# Patient Record
Sex: Female | Born: 1955 | Race: White | Hispanic: No | Marital: Married | Smoking: Never smoker
Health system: Southern US, Community
[De-identification: ages and names within clinical notes are randomized; demographics above are authoritative.]

## PROBLEM LIST (undated history)

## (undated) DIAGNOSIS — C4491 Basal cell carcinoma of skin, unspecified: Secondary | ICD-10-CM

## (undated) DIAGNOSIS — I10 Essential (primary) hypertension: Secondary | ICD-10-CM

## (undated) HISTORY — DX: Essential (primary) hypertension: I10

## (undated) HISTORY — PX: MYOMECTOMY: SHX85

## (undated) HISTORY — DX: Basal cell carcinoma of skin, unspecified: C44.91

---

## 1998-07-13 ENCOUNTER — Other Ambulatory Visit: Admission: RE | Admit: 1998-07-13 | Discharge: 1998-07-13 | Payer: Self-pay | Admitting: *Deleted

## 1999-10-04 ENCOUNTER — Other Ambulatory Visit: Admission: RE | Admit: 1999-10-04 | Discharge: 1999-10-04 | Payer: Self-pay | Admitting: Obstetrics and Gynecology

## 1999-11-30 ENCOUNTER — Encounter (INDEPENDENT_AMBULATORY_CARE_PROVIDER_SITE_OTHER): Payer: Self-pay

## 1999-11-30 ENCOUNTER — Other Ambulatory Visit: Admission: RE | Admit: 1999-11-30 | Discharge: 1999-11-30 | Payer: Self-pay | Admitting: Obstetrics and Gynecology

## 2000-12-12 ENCOUNTER — Other Ambulatory Visit: Admission: RE | Admit: 2000-12-12 | Discharge: 2000-12-12 | Payer: Self-pay | Admitting: Obstetrics and Gynecology

## 2001-03-19 ENCOUNTER — Ambulatory Visit (HOSPITAL_COMMUNITY): Admission: RE | Admit: 2001-03-19 | Discharge: 2001-03-19 | Payer: Self-pay | Admitting: Obstetrics and Gynecology

## 2001-03-19 ENCOUNTER — Encounter (INDEPENDENT_AMBULATORY_CARE_PROVIDER_SITE_OTHER): Payer: Self-pay

## 2001-12-31 ENCOUNTER — Other Ambulatory Visit: Admission: RE | Admit: 2001-12-31 | Discharge: 2001-12-31 | Payer: Self-pay | Admitting: Obstetrics and Gynecology

## 2003-01-18 ENCOUNTER — Other Ambulatory Visit: Admission: RE | Admit: 2003-01-18 | Discharge: 2003-01-18 | Payer: Self-pay | Admitting: Obstetrics and Gynecology

## 2004-04-05 ENCOUNTER — Other Ambulatory Visit: Admission: RE | Admit: 2004-04-05 | Discharge: 2004-04-05 | Payer: Self-pay | Admitting: Obstetrics and Gynecology

## 2004-10-17 ENCOUNTER — Ambulatory Visit: Payer: Self-pay | Admitting: Family Medicine

## 2005-03-14 ENCOUNTER — Ambulatory Visit: Payer: Self-pay | Admitting: Family Medicine

## 2005-03-21 ENCOUNTER — Ambulatory Visit: Payer: Self-pay | Admitting: Gastroenterology

## 2005-03-28 ENCOUNTER — Ambulatory Visit: Payer: Self-pay | Admitting: Family Medicine

## 2005-04-26 ENCOUNTER — Other Ambulatory Visit: Admission: RE | Admit: 2005-04-26 | Discharge: 2005-04-26 | Payer: Self-pay | Admitting: Obstetrics and Gynecology

## 2005-06-14 ENCOUNTER — Ambulatory Visit: Payer: Self-pay | Admitting: Internal Medicine

## 2005-06-14 ENCOUNTER — Ambulatory Visit: Payer: Self-pay | Admitting: Family Medicine

## 2006-05-01 ENCOUNTER — Other Ambulatory Visit: Admission: RE | Admit: 2006-05-01 | Discharge: 2006-05-01 | Payer: Self-pay | Admitting: Obstetrics and Gynecology

## 2006-09-18 ENCOUNTER — Ambulatory Visit: Payer: Self-pay | Admitting: Family Medicine

## 2006-09-26 ENCOUNTER — Ambulatory Visit: Payer: Self-pay | Admitting: Family Medicine

## 2006-09-26 LAB — CONVERTED CEMR LAB
ALT: 14 units/L (ref 0–40)
AST: 20 units/L (ref 0–37)
Albumin: 4.4 g/dL (ref 3.5–5.2)
Alkaline Phosphatase: 40 units/L (ref 39–117)
BUN: 18 mg/dL (ref 6–23)
Basophils Relative: 0.5 % (ref 0.0–1.0)
CO2: 23 meq/L (ref 19–32)
Calcium: 9.7 mg/dL (ref 8.4–10.5)
Cholesterol: 174 mg/dL (ref 0–200)
GFR calc non Af Amer: 80 mL/min
HCT: 39.6 % (ref 36.0–46.0)
HDL: 75 mg/dL (ref >39.0)
LDL Cholesterol: 91 mg/dL (ref 0–99)
MCHC: 33.9 g/dL (ref 30.0–36.0)
Monocytes Absolute: 0.4 10*3/uL (ref 0.2–0.7)
Neutrophils Relative %: 53 % (ref 43.0–77.0)
Platelets: 234 10*3/uL (ref 150–400)
Triglyceride fasting, serum: 42 mg/dL (ref 0–149)
VLDL: 8 mg/dL (ref 0–40)

## 2006-10-10 ENCOUNTER — Ambulatory Visit: Payer: Self-pay | Admitting: Family Medicine

## 2006-10-31 ENCOUNTER — Ambulatory Visit: Payer: Self-pay | Admitting: Family Medicine

## 2006-11-20 ENCOUNTER — Ambulatory Visit: Payer: Self-pay | Admitting: Family Medicine

## 2006-11-20 LAB — CONVERTED CEMR LAB
CO2: 28 meq/L (ref 19–32)
Calcium: 9.4 mg/dL (ref 8.4–10.5)
Chloride: 102 meq/L (ref 96–112)
GFR calc non Af Amer: 94 mL/min
Glucose, Bld: 110 mg/dL — ABNORMAL HIGH (ref 70–99)
Potassium: 4.1 meq/L (ref 3.5–5.1)

## 2006-12-01 IMAGING — CT CT PARANASAL SINUSES LIMITED
1 of 2 series · 16 of 22 positions shown, 20 images · non-contrast
Comparison: none

CLINICAL DATA: 49-year-old female with chronic sinusitis and post-nasal drainage with sinus pain and pressure.  
LIMITED CT OF PARANASAL SINUSES:
TECHNIQUE: Limited coronal CT images were obtained through the paranasal sinuses without intravenous contrast.

[Series 4: ltd sinus 3.0 h30s · axial · 0.29mm/px · z∈[-92,-11]mm · 16 of 21 slices shown, 20 images]
[im 2/21  brain]
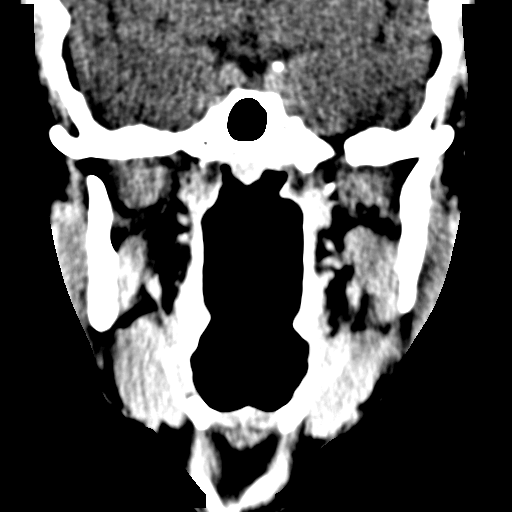
[im 2/21  bone]
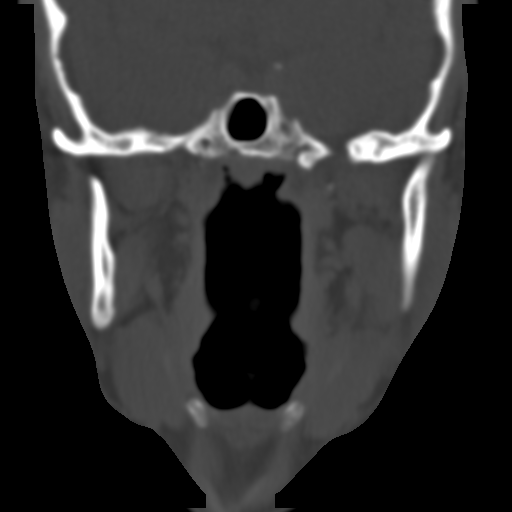
[im 3/21  bone]
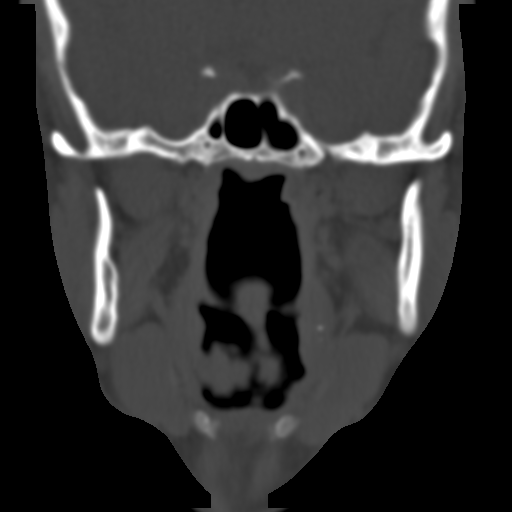
[im 4/21  bone]
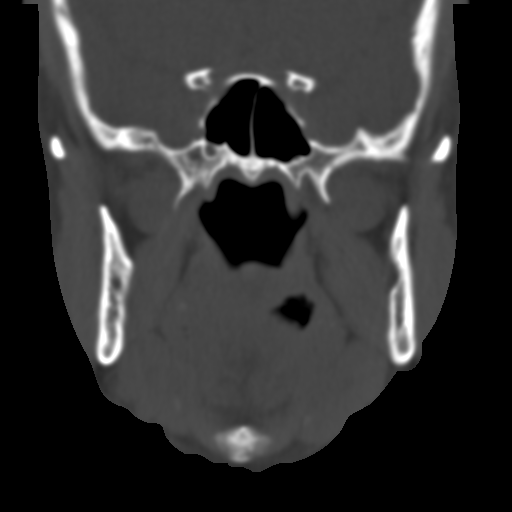
[im 6/21  bone]
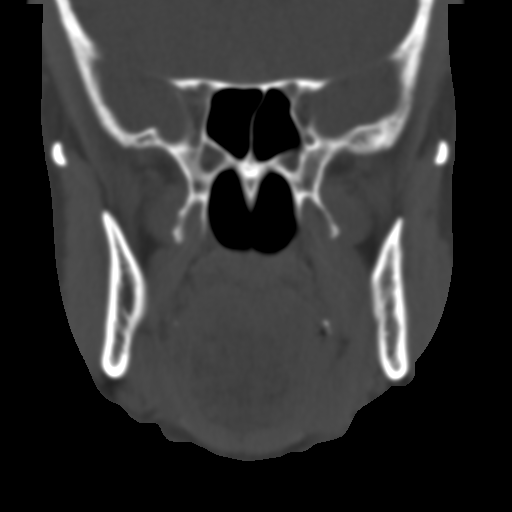
[im 7/21  brain]
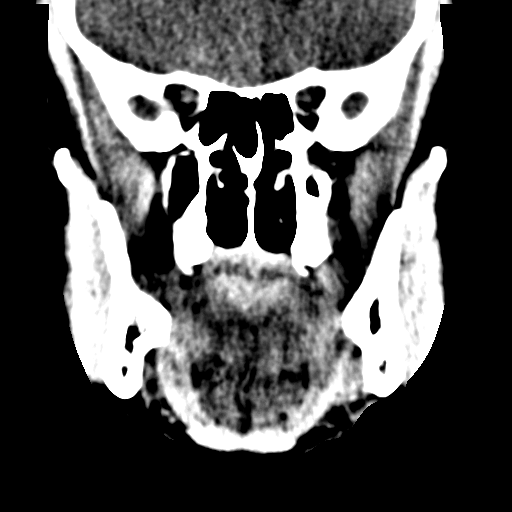
[im 7/21  bone]
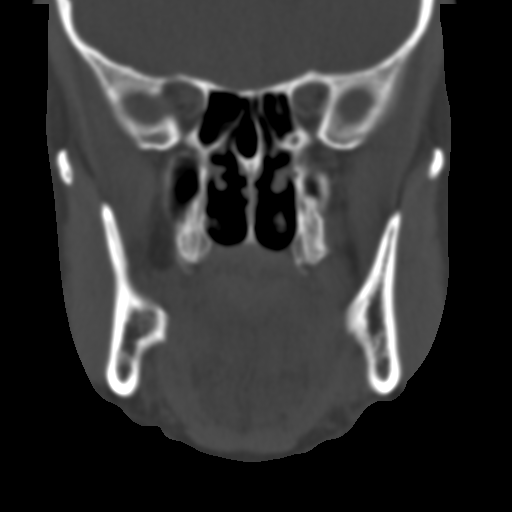
[im 8/21  bone]
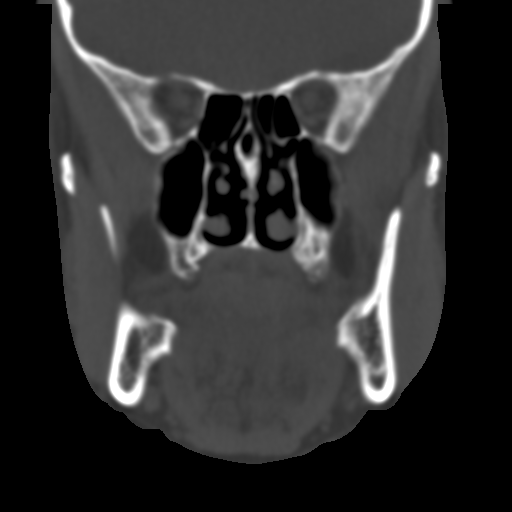
[im 9/21  bone]
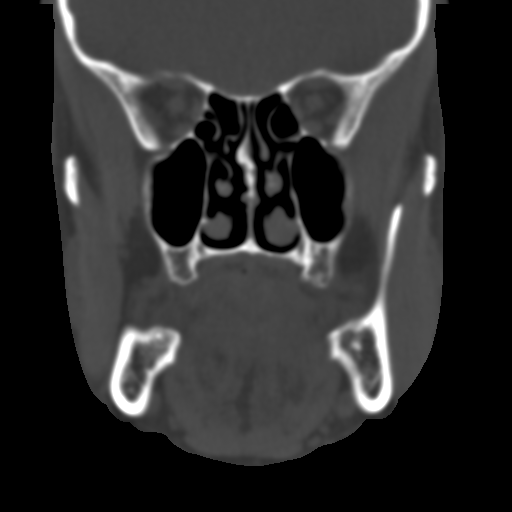
[im 10/21  bone]
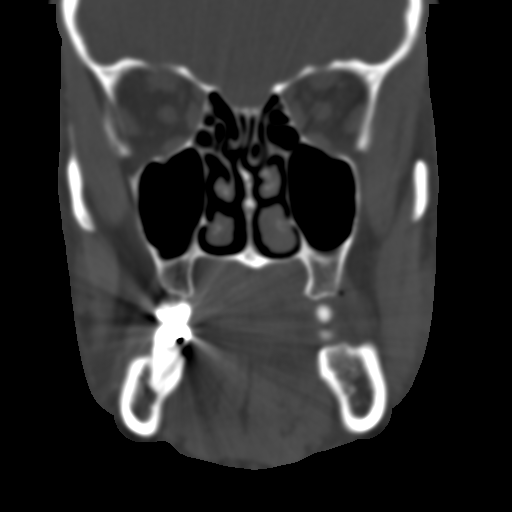
[im 12/21  brain]
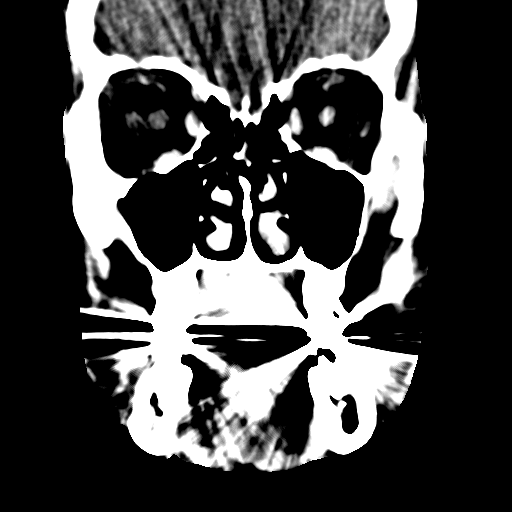
[im 12/21  bone]
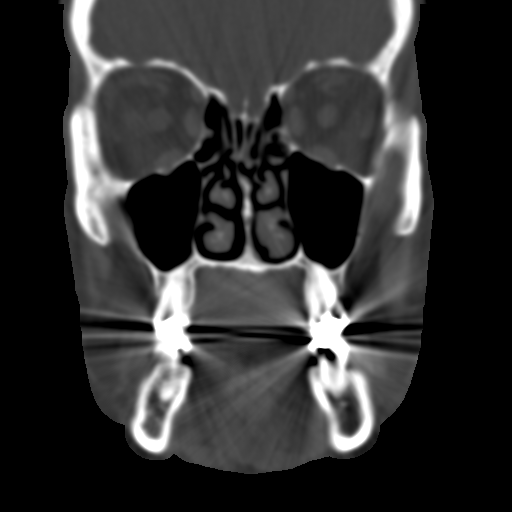
[im 13/21  bone]
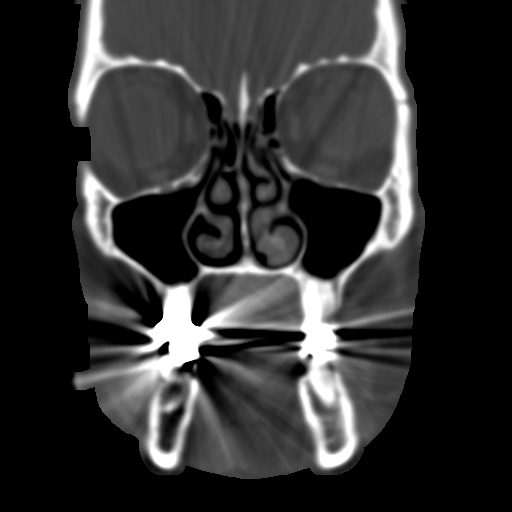
[im 14/21  bone]
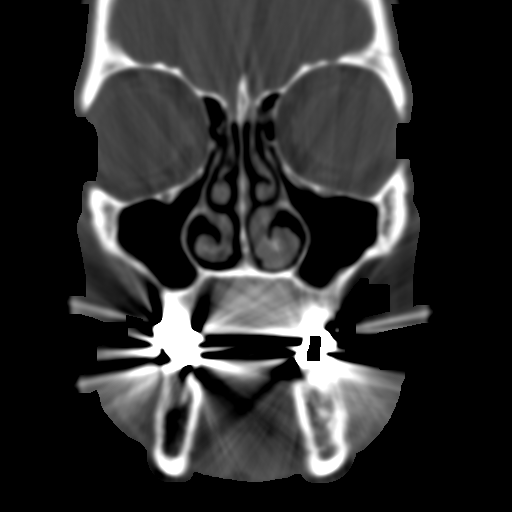
[im 15/21  bone]
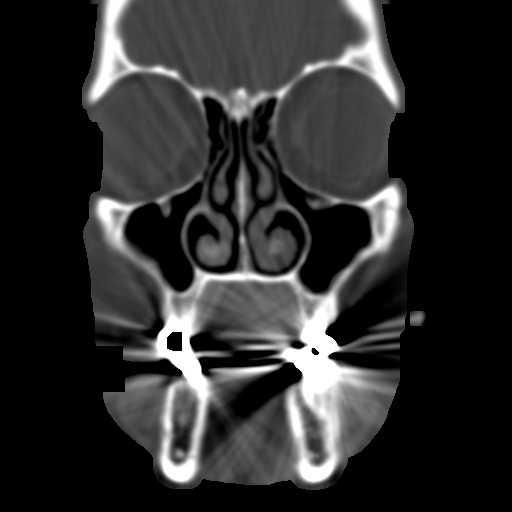
[im 16/21  brain]
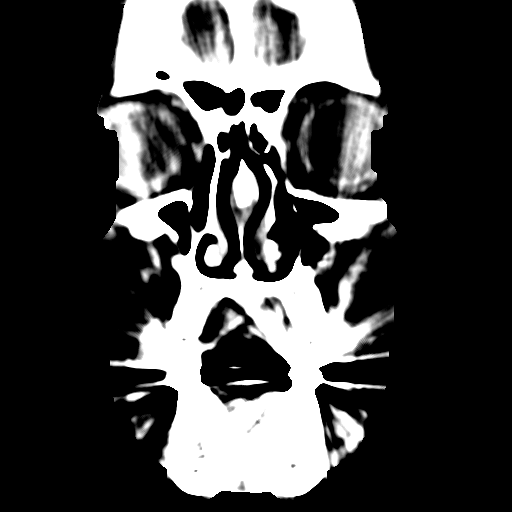
[im 16/21  bone]
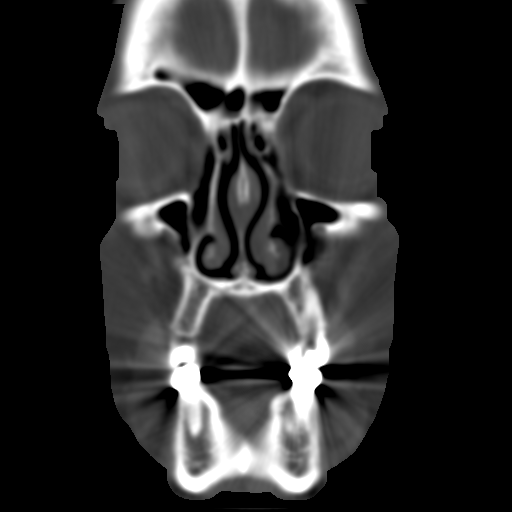
[im 18/21  bone]
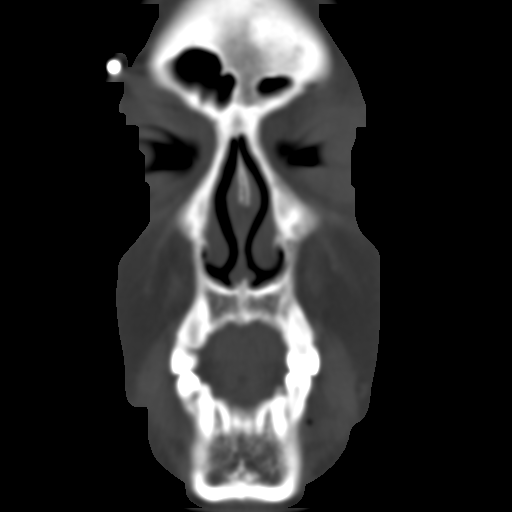
[im 19/21  bone]
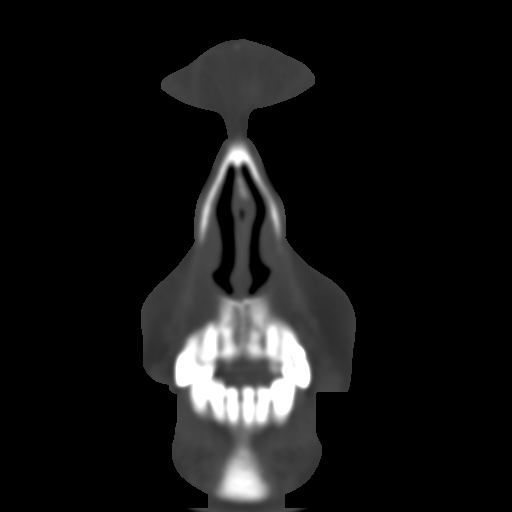
[im 20/21  bone]
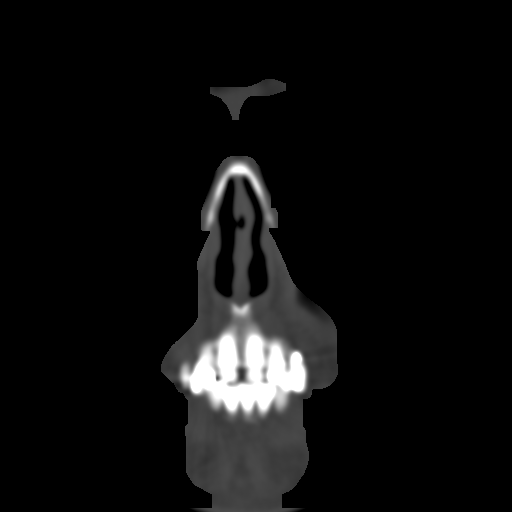

[16 of 22 positions shown; findings below may reference images not displayed]

FINDINGS: The sphenoid, ethmoid, maxillary, and frontal sinuses are clear.  No acute sinus air fluid level.  No significant hyperostosis or mucosal thickening to suggest chronic sinus disease.  The ostiomeatal complex anatomy is patent and symmetric.  Uncinate processes are normal.  No bony erosion.  Nasal bones are intact.  Septum is midline.
IMPRESSION: 1.  No significant acute or chronic sinus disease. 
2.  Patent symmetric ostiomeatal complex anatomy.

## 2007-01-20 ENCOUNTER — Ambulatory Visit: Payer: Self-pay | Admitting: Family Medicine

## 2007-02-07 ENCOUNTER — Ambulatory Visit: Payer: Self-pay | Admitting: Family Medicine

## 2007-02-11 ENCOUNTER — Encounter: Payer: Self-pay | Admitting: Family Medicine

## 2007-03-14 DIAGNOSIS — J019 Acute sinusitis, unspecified: Secondary | ICD-10-CM | POA: Insufficient documentation

## 2007-03-14 DIAGNOSIS — C4490 Unspecified malignant neoplasm of skin, unspecified: Secondary | ICD-10-CM | POA: Insufficient documentation

## 2007-03-18 ENCOUNTER — Encounter: Payer: Self-pay | Admitting: Family Medicine

## 2007-03-18 ENCOUNTER — Ambulatory Visit: Payer: Self-pay | Admitting: Family Medicine

## 2007-03-18 DIAGNOSIS — M25519 Pain in unspecified shoulder: Secondary | ICD-10-CM | POA: Insufficient documentation

## 2007-03-18 DIAGNOSIS — I10 Essential (primary) hypertension: Secondary | ICD-10-CM | POA: Insufficient documentation

## 2007-03-18 DIAGNOSIS — Z85828 Personal history of other malignant neoplasm of skin: Secondary | ICD-10-CM | POA: Insufficient documentation

## 2007-03-18 LAB — CONVERTED CEMR LAB
BUN: 18 mg/dL (ref 6–23)
CO2: 31 meq/L (ref 19–32)
Creatinine, Ser: 0.7 mg/dL (ref 0.4–1.2)

## 2007-04-08 ENCOUNTER — Encounter: Payer: Self-pay | Admitting: Family Medicine

## 2007-04-08 ENCOUNTER — Ambulatory Visit: Payer: Self-pay | Admitting: Family Medicine

## 2007-04-08 DIAGNOSIS — I714 Abdominal aortic aneurysm, without rupture, unspecified: Secondary | ICD-10-CM | POA: Insufficient documentation

## 2007-07-03 ENCOUNTER — Ambulatory Visit: Payer: Self-pay | Admitting: Family Medicine

## 2007-07-07 ENCOUNTER — Encounter: Payer: Self-pay | Admitting: Family Medicine

## 2007-07-11 ENCOUNTER — Encounter (INDEPENDENT_AMBULATORY_CARE_PROVIDER_SITE_OTHER): Payer: Self-pay | Admitting: *Deleted

## 2007-09-29 ENCOUNTER — Ambulatory Visit: Payer: Self-pay | Admitting: Family Medicine

## 2007-09-30 ENCOUNTER — Encounter: Payer: Self-pay | Admitting: Family Medicine

## 2008-01-21 ENCOUNTER — Encounter: Payer: Self-pay | Admitting: Family Medicine

## 2008-01-21 LAB — HM COLONOSCOPY

## 2008-01-23 ENCOUNTER — Encounter: Payer: Self-pay | Admitting: Family Medicine

## 2008-03-10 ENCOUNTER — Telehealth (INDEPENDENT_AMBULATORY_CARE_PROVIDER_SITE_OTHER): Payer: Self-pay | Admitting: *Deleted

## 2008-06-18 ENCOUNTER — Ambulatory Visit: Payer: Self-pay | Admitting: Family Medicine

## 2008-06-18 DIAGNOSIS — R059 Cough, unspecified: Secondary | ICD-10-CM | POA: Insufficient documentation

## 2008-06-18 DIAGNOSIS — R05 Cough: Secondary | ICD-10-CM

## 2008-06-18 DIAGNOSIS — R053 Chronic cough: Secondary | ICD-10-CM | POA: Insufficient documentation

## 2008-07-05 ENCOUNTER — Ambulatory Visit: Payer: Self-pay | Admitting: Family Medicine

## 2008-07-06 ENCOUNTER — Encounter (INDEPENDENT_AMBULATORY_CARE_PROVIDER_SITE_OTHER): Payer: Self-pay | Admitting: *Deleted

## 2008-07-06 LAB — CONVERTED CEMR LAB
BUN: 14 mg/dL (ref 6–23)
Calcium: 9.4 mg/dL (ref 8.4–10.5)
Chloride: 106 meq/L (ref 96–112)
GFR calc Af Amer: 166 mL/min
Potassium: 4.4 meq/L (ref 3.5–5.1)
Sodium: 141 meq/L (ref 135–145)

## 2008-07-07 ENCOUNTER — Encounter: Payer: Self-pay | Admitting: Family Medicine

## 2008-07-22 ENCOUNTER — Telehealth (INDEPENDENT_AMBULATORY_CARE_PROVIDER_SITE_OTHER): Payer: Self-pay | Admitting: *Deleted

## 2008-07-22 ENCOUNTER — Encounter (INDEPENDENT_AMBULATORY_CARE_PROVIDER_SITE_OTHER): Payer: Self-pay | Admitting: *Deleted

## 2008-08-09 ENCOUNTER — Ambulatory Visit: Payer: Self-pay | Admitting: Family Medicine

## 2008-08-09 DIAGNOSIS — J0110 Acute frontal sinusitis, unspecified: Secondary | ICD-10-CM | POA: Insufficient documentation

## 2008-10-04 ENCOUNTER — Encounter: Payer: Self-pay | Admitting: Family Medicine

## 2008-11-09 ENCOUNTER — Ambulatory Visit: Payer: Self-pay | Admitting: Family Medicine

## 2008-11-10 ENCOUNTER — Telehealth: Payer: Self-pay | Admitting: Family Medicine

## 2009-03-07 ENCOUNTER — Ambulatory Visit: Payer: Self-pay | Admitting: Family Medicine

## 2009-03-15 ENCOUNTER — Telehealth: Payer: Self-pay | Admitting: Family Medicine

## 2009-07-01 ENCOUNTER — Ambulatory Visit: Payer: Self-pay | Admitting: Family Medicine

## 2009-07-01 DIAGNOSIS — D239 Other benign neoplasm of skin, unspecified: Secondary | ICD-10-CM | POA: Insufficient documentation

## 2009-07-02 ENCOUNTER — Encounter: Payer: Self-pay | Admitting: Family Medicine

## 2009-07-06 ENCOUNTER — Encounter (INDEPENDENT_AMBULATORY_CARE_PROVIDER_SITE_OTHER): Payer: Self-pay | Admitting: *Deleted

## 2009-07-12 ENCOUNTER — Encounter (INDEPENDENT_AMBULATORY_CARE_PROVIDER_SITE_OTHER): Payer: Self-pay | Admitting: *Deleted

## 2009-07-12 ENCOUNTER — Telehealth (INDEPENDENT_AMBULATORY_CARE_PROVIDER_SITE_OTHER): Payer: Self-pay | Admitting: *Deleted

## 2009-07-12 ENCOUNTER — Encounter: Payer: Self-pay | Admitting: Family Medicine

## 2009-08-17 ENCOUNTER — Encounter: Payer: Self-pay | Admitting: Family Medicine

## 2009-08-23 ENCOUNTER — Ambulatory Visit: Payer: Self-pay | Admitting: Family Medicine

## 2009-08-24 ENCOUNTER — Encounter: Payer: Self-pay | Admitting: Family Medicine

## 2010-01-06 ENCOUNTER — Ambulatory Visit: Payer: Self-pay | Admitting: Family Medicine

## 2010-05-02 ENCOUNTER — Ambulatory Visit: Payer: Self-pay | Admitting: Family Medicine

## 2010-07-25 ENCOUNTER — Encounter: Payer: Self-pay | Admitting: Family Medicine

## 2010-09-13 ENCOUNTER — Ambulatory Visit: Payer: Self-pay | Admitting: Family Medicine

## 2010-09-13 ENCOUNTER — Encounter: Payer: Self-pay | Admitting: Family Medicine

## 2010-09-13 DIAGNOSIS — J309 Allergic rhinitis, unspecified: Secondary | ICD-10-CM | POA: Insufficient documentation

## 2010-09-15 LAB — CONVERTED CEMR LAB
Basophils Relative: 0.7 % (ref 0.0–3.0)
Eosinophils Absolute: 0.2 10*3/uL (ref 0.0–0.7)
Hemoglobin: 13.3 g/dL (ref 12.0–15.0)
MCHC: 34.2 g/dL (ref 30.0–36.0)
MCV: 93.5 fL (ref 78.0–100.0)
Monocytes Absolute: 0.4 10*3/uL (ref 0.1–1.0)
Neutro Abs: 1.6 10*3/uL (ref 1.4–7.7)
RBC: 4.16 M/uL (ref 3.87–5.11)

## 2010-09-19 ENCOUNTER — Ambulatory Visit: Payer: Self-pay | Admitting: Family Medicine

## 2010-10-18 ENCOUNTER — Encounter: Payer: Self-pay | Admitting: Family Medicine

## 2010-10-23 ENCOUNTER — Telehealth (INDEPENDENT_AMBULATORY_CARE_PROVIDER_SITE_OTHER): Payer: Self-pay | Admitting: *Deleted

## 2010-10-27 ENCOUNTER — Ambulatory Visit: Payer: Self-pay | Admitting: Family Medicine

## 2010-11-27 ENCOUNTER — Ambulatory Visit
Admission: RE | Admit: 2010-11-27 | Discharge: 2010-11-27 | Payer: Self-pay | Source: Home / Self Care | Attending: Family Medicine | Admitting: Family Medicine

## 2010-12-10 LAB — CONVERTED CEMR LAB
ALT: 15 units/L (ref 0–35)
AST: 23 units/L (ref 0–37)
Albumin: 4.4 g/dL (ref 3.5–5.2)
Albumin: 4.6 g/dL (ref 3.5–5.2)
Alkaline Phosphatase: 34 units/L — ABNORMAL LOW (ref 39–117)
BUN: 17 mg/dL (ref 6–23)
BUN: 20 mg/dL (ref 6–23)
Basophils Absolute: 0 10*3/uL (ref 0.0–0.1)
Basophils Relative: 0.3 % (ref 0.0–3.0)
Bilirubin, Direct: 0.1 mg/dL (ref 0.0–0.3)
CO2: 30 meq/L (ref 19–32)
Calcium: 9.6 mg/dL (ref 8.4–10.5)
Chloride: 103 meq/L (ref 96–112)
Chloride: 107 meq/L (ref 96–112)
Cholesterol: 185 mg/dL (ref 0–200)
Cholesterol: 188 mg/dL (ref 0–200)
Creatinine, Ser: 0.6 mg/dL (ref 0.4–1.2)
Eosinophils Absolute: 0.1 10*3/uL (ref 0.0–0.7)
Eosinophils Absolute: 0.2 10*3/uL (ref 0.0–0.6)
GFR calc non Af Amer: 110.54 mL/min (ref >60)
GFR calc non Af Amer: 112 mL/min
Glucose, Bld: 97 mg/dL (ref 70–99)
HCT: 41 % (ref 36.0–46.0)
Hemoglobin: 13.5 g/dL (ref 12.0–15.0)
Hemoglobin: 13.9 g/dL (ref 12.0–15.0)
LDL Cholesterol: 88 mg/dL (ref 0–99)
LDL Cholesterol: 95 mg/dL (ref 0–99)
Lymphocytes Relative: 31.8 % (ref 12.0–46.0)
Lymphocytes Relative: 34.8 % (ref 12.0–46.0)
MCV: 94.4 fL (ref 78.0–100.0)
Monocytes Absolute: 0.4 10*3/uL (ref 0.1–1.0)
Monocytes Absolute: 0.5 10*3/uL (ref 0.2–0.7)
Monocytes Relative: 8.3 % (ref 3.0–12.0)
Neutro Abs: 2.4 10*3/uL (ref 1.4–7.7)
Platelets: 231 10*3/uL (ref 150.0–400.0)
Platelets: 242 10*3/uL (ref 150–400)
Potassium: 3.4 meq/L — ABNORMAL LOW (ref 3.5–5.1)
RBC: 4.34 M/uL (ref 3.87–5.11)
RDW: 12.1 % (ref 11.5–14.6)
RDW: 12.7 % (ref 11.5–14.6)
Sodium: 139 meq/L (ref 135–145)
Sodium: 142 meq/L (ref 135–145)
TSH: 1.85 microintl units/mL (ref 0.35–5.50)
Total Bilirubin: 1 mg/dL (ref 0.3–1.2)
Total CHOL/HDL Ratio: 2.5
Total Protein: 8.2 g/dL (ref 6.0–8.3)
Triglycerides: 105 mg/dL (ref 0.0–149.0)

## 2010-12-12 NOTE — Assessment & Plan Note (Signed)
Summary: FORM SINUS//PH   Vital Signs:  Patient profile:   55 year old female Weight:      121.8 pounds O2 Sat:      100 % on Room air Temp:     98.3 degrees F oral Pulse rate:   72 / minute Pulse rhythm:   regular BP sitting:   116 / 80  (right arm) Cuff size:   regular  Vitals Entered By: Almeta Monas CMA Duncan Dull) (September 19, 2010 10:52 AM)  O2 Flow:  Room air CC: c/o sinus pressure, facial swelling, cough, congestion and post nasal drip, URI symptoms   History of Present Illness:       This is a 55 year old woman who presents with URI symptoms.  The symptoms began 5 days ago.  Pt c/o b/l sinus pressure and teeth pain since friday.  The patient complains of nasal congestion, purulent nasal discharge, productive cough, earache, and sick contacts, but denies clear nasal discharge, sore throat, and dry cough.  The patient denies fever, low-grade fever (<100.5 degrees), fever of 100.5-103 degrees, fever of 103.1-104 degrees, fever to >104 degrees, stiff neck, dyspnea, wheezing, rash, vomiting, diarrhea, use of an antipyretic, and response to antipyretic.  The patient also reports headache.  The patient denies itchy watery eyes, itchy throat, sneezing, seasonal symptoms, response to antihistamine, muscle aches, and severe fatigue.  The patient denies the following risk factors for Strep sinusitis: unilateral facial pain, unilateral nasal discharge, poor response to decongestant, double sickening, tooth pain, Strep exposure, tender adenopathy, and absence of cough.    Current Medications (verified): 1)  Diovan Hct 160-12.5 Mg Tabs (Valsartan-Hydrochlorothiazide) .... Take One Tablet Daily 2)  Astepro 137 Mcg/spray  Soln (Azelastine Hcl) .... 2 Sprays Each Nostril Two Times A Day 3)  Calcium 500/vitamin D 500-125 Mg-Unit Tabs (Calcium Carbonate-Vitamin D) .... Take As Directed 4)  Nasonex 50 Mcg/act Susp (Mometasone Furoate) .... 2 Sprays Each Nostril Once Daily 5)  Xyzal 5 Mg Tabs  (Levocetirizine Dihydrochloride) .Marland Kitchen.. 1 By Mouth Once Daily 6)  Klor-Con 10 10 Meq Cr-Tabs (Potassium Chloride) .... Take 1 By Mouth Once Daily**labs Due Now* 7)  Augmentin 875-125 Mg Tabs (Amoxicillin-Pot Clavulanate) .Marland Kitchen.. 1 By Mouth Two Times A Day 8)  Prednisone 10 Mg Tabs (Prednisone) .... 3 By Mouth Once Daily For 3 Days Then 2 By Mouth Once Daily For 3 Days Then 1 By Mouth Once Daily For 3 Days  Allergies (verified): 1)  ! Erythromycin 2)  ! 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 Family History Diabetes 1st degree relative Family History Hypertension Family History of Skin cancer, melanoma Family History of CAD Female 1st degree relative <50 copd  Social History: Reviewed history from 03/18/2007 and no changes required. 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   Mouth:  Oral mucosa and oropharynx without lesions or exudates.  Teeth in good repair. Neck:  No deformities, masses, or tenderness noted. Lungs:  Normal respiratory effort, chest expands symmetrically. Lungs are clear to auscultation, no crackles or wheezes. Heart:  normal rate and no murmur.     Impression & Recommendations:  Problem # 1:  SINUSITIS - ACUTE-NOS (ICD-461.9)  Her updated medication list for this problem includes:    Astepro 137 Mcg/spray Soln (Azelastine hcl) .Marland Kitchen... 2 sprays each nostril two times a day    Nasonex 50 Mcg/act Susp (Mometasone furoate) .Marland Kitchen... 2 sprays each nostril once daily    Augmentin 875-125 Mg Tabs (Amoxicillin-pot clavulanate) .Marland Kitchen... 1 by mouth two times a day  Instructed on treatment. Call if symptoms persist or worsen.   Orders: Depo- Medrol 80mg  (J1040) Admin of Therapeutic Inj  intramuscular or subcutaneous ([address])  Complete Medication List: 1)  Diovan Hct 160-12.5 Mg Tabs (Valsartan-hydrochlorothiazide) .... Take one tablet daily 2)  Astepro 137 Mcg/spray Soln (Azelastine hcl) .... 2 sprays each nostril two times a day 3)  Calcium 500/vitamin D 500-125 Mg-unit Tabs (Calcium carbonate-vitamin d) .... Take as directed 4)  Nasonex 50 Mcg/act Susp (Mometasone furoate) .... 2 sprays each nostril once daily 5)  Xyzal 5 Mg Tabs (Levocetirizine dihydrochloride) .Marland Kitchen.. 1 by mouth once daily 6)  Klor-con 10 10 Meq Cr-tabs (Potassium chloride) .... Take 1 by mouth once daily**labs due now* 7)  Augmentin 875-125 Mg Tabs (Amoxicillin-pot clavulanate) .Marland Kitchen.. 1 by mouth two times a day 8)  Prednisone 10 Mg Tabs (Prednisone) .... 3 by mouth once daily for 3 days then 2 by mouth once daily for 3 days then 1 by mouth once daily for 3 days  Patient Instructions: 1)  Acute sinusitis symptoms for less than 10 days are not helped by antibiotics. Use warm moist compresses, and over the counter decongestants( only as directed). Call if no  improvement in 5-7 days, sooner if increasing pain, fever, or new symptoms.  Prescriptions: PREDNISONE 10 MG TABS (PREDNISONE) 3 by mouth once daily for 3 days then 2 by mouth once daily for 3 days then 1 by mouth once daily for 3 days  #18 x 0   Entered and Authorized by:   Loreen Freud DO   Signed by:   Loreen Freud DO on 09/19/2010   Method used:   Electronically to        [facility] #[id number]* (retail)       [address]       [place], [place]  [address]       Ph: [phone]       Fax: [phone]   RxID:   [id number] AUGMENTIN 875-125 MG TABS (AMOXICILLIN-POT CLAVULANATE) 1 by mouth two times a day  #20 x 0   Entered and Authorized by:   Loreen Freud DO   Signed by:   Loreen Freud DO on 09/19/2010   Method used:   Electronically to        [facility] #[id number]* (retail)       [address]       [employer] [employer]  [place], [place]  [address]       Ph: [phone]       Fax: [phone]   RxID:   [id number]    Medication Administration  Injection # 1:    Medication: Depo- Medrol 80mg     Diagnosis: SINUSITIS - ACUTE-NOS (ICD-461.9)    Route: IM    Site: RUOQ gluteus    Exp Date: 12/13/2010    Lot #: [id number]    Mfr: [email or web address]    Patient tolerated injection without complications    Given by: Almeta Monas CMA Duncan Dull) (September 19, 2010 11:38 AM)  Orders Added: 1)  Depo- Medrol 80mg  [J1040] 2)  Admin of Therapeutic Inj  intramuscular or subcutaneous [96372] 3)  Est. Patient Level III [40102]

## 2010-12-12 NOTE — Letter (Signed)
Summary: External Correspondence//[facility]  External Correspondence//[facility]   Imported By: Freddy Jaksch 07/10/2007 12:12:20  _____________________________________________________________________  External Attachment:    Type:   Image     Comment:   External Document  Appended Document: External Correspondence//[facility]    Clinical Lists Changes  Observations: Added new observation of MAMMO DUE: 07/2008 (07/11/2007 10:26) Added new observation of MAMMOGRAM: normal bilateral (07/07/2007 10:27)        Preventive Care Screening  Mammogram:    Date:  07/07/2007    Next Due:  07/2008    Results:  normal bilateral  Appended Document: External Correspondence//[facility] LETTER MAILED

## 2010-12-12 NOTE — Assessment & Plan Note (Signed)
Summary: SINUS INFECTION/[facility]   Vital Signs:  Patient profile:   55 year old female Height:      66 inches Weight:      123 pounds BMI:     19.92 Temp:     98.2 degrees F oral Pulse rate:   76 / minute Pulse rhythm:   regular BP sitting:   124 / 82  (left arm) Cuff size:   regular  Vitals Entered By: Army Fossa CMA (January 06, 2010 9:57 AM) CC: Pt c/o sinus pressure around eyes, cough, drainage., URI symptoms   History of Present Illness:       This is a 55 year old woman who presents with URI symptoms.  The symptoms began 2 weeks ago.  The patient complains of nasal congestion, purulent nasal discharge, dry cough, and sick contacts.  The patient denies fever, low-grade fever (<100.5 degrees), fever of 100.5-103 degrees, fever of 103.1-104 degrees, fever to >104 degrees, stiff neck, dyspnea, wheezing, rash, vomiting, diarrhea, use of an antipyretic, and response to antipyretic.  The patient also reports itchy throat and headache.  The patient denies itchy watery eyes, sneezing, seasonal symptoms, response to antihistamine, muscle aches, and severe fatigue.  The patient denies the following risk factors for Strep sinusitis: unilateral facial pain, unilateral nasal discharge, poor response to decongestant, double sickening, tooth pain, Strep exposure, tender adenopathy, and absence of cough.  Pt taking sprays and zyrtec with no relief.     Current Medications (verified): 1)  Diovan Hct 160-12.5 Mg Tabs (Valsartan-Hydrochlorothiazide) .... Take One Tablet Daily 2)  Astepro 137 Mcg/spray  Soln (Azelastine Hcl) .... 2 Sprays Each Nostril Two Times A Day 3)  Calcium 500/vitamin D 500-125 Mg-Unit Tabs (Calcium Carbonate-Vitamin D) .... Take As Directed 4)  Nasonex 50 Mcg/act Susp (Mometasone Furoate) .... 2 Sprays Each Nostril Once Daily 5)  Zyrtec Allergy 10 Mg Caps (Cetirizine Hcl) .... Take 1 Tab Once Daily 6)  Klor-Con 10 10 Meq Cr-Tabs (Potassium Chloride) .... Take 1 By Mouth Once  Daily 7)  Ceftin 500 Mg Tabs (Cefuroxime Axetil) .Marland Kitchen.. 1 By Mouth Two Times A Day 8)  Prednisone 10 Mg Tabs (Prednisone) .... 3 By Mouth Once Daily For 3 Days Then 2 Poqd For 3 Days Then 1 By Mouth Once Daily For 3 Days  Allergies: 1)  ! Erythromycin 2)  ! Ace Inhibitors  Past History:  Past medical, surgical, family and social histories (including risk factors) reviewed for relevance to current acute and chronic problems.  Past Medical History: Reviewed history from 03/18/2007 and no changes required. Skin cancer, hx of, Basal cell Hypertension  Past Surgical History: Reviewed history from 09/29/2007 and no changes required. myomectomy 03/2001  Family History: Reviewed history from 09/29/2007 and no changes required. Family History Diabetes 1st degree relative Family History Hypertension Family History of Skin cancer, melanoma Family History of CAD Female 1st degree relative <50 copd  Social History: Reviewed history from 03/18/2007 and no changes required. 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  Otoscopic examination reveals clear canals, tympanic membranes are intact bilaterally without bulging, retraction, inflammation or discharge. Hearing is grossly normal bilaterally. Nose:  L frontal sinus tenderness, L maxillary sinus tenderness, R frontal sinus tenderness, and R maxillary  sinus tenderness.   Mouth:  Oral mucosa and oropharynx without lesions or exudates.  Teeth in good repair. Neck:  No deformities, masses, or tenderness noted. Lungs:  Normal respiratory effort, chest expands symmetrically. Lungs are clear to auscultation, no crackles or wheezes. Heart:  Normal  rate and regular rhythm. S1 and S2 normal without gallop, murmur, click, rub or other extra sounds. Msk:  No deformity or scoliosis noted of thoracic or lumbar spine.   Extremities:  No clubbing, cyanosis, edema, or deformity noted with normal full range of motion of all joints.   Neurologic:  No cranial nerve deficits noted. Station and gait are normal. Plantar reflexes are down-going bilaterally. DTRs are symmetrical throughout. Sensory, motor and coordinative functions appear intact. Skin:  Intact without suspicious lesions or rashes Cervical Nodes:  No lymphadenopathy noted Psych:  Oriented X3 and normally interactive.     Impression & Recommendations:  Problem # 1:  ACUTE FRONTAL SINUSITIS (ICD-461.1)  Her updated medication list for this problem includes:    Astepro 137 Mcg/spray Soln (Azelastine hcl) .Marland Kitchen... 2 sprays each nostril two times a day    Nasonex 50 Mcg/act Susp (Mometasone furoate) .Marland Kitchen... 2 sprays each nostril once daily    Ceftin 500 Mg Tabs (Cefuroxime axetil) .Marland Kitchen... 1 by mouth two times a day    prednisone taper   Instructed on treatment. Call if symptoms persist or worsen.   Complete Medication List: 1)  Diovan Hct 160-12.5 Mg Tabs (Valsartan-hydrochlorothiazide) .... Take one tablet daily 2)  Astepro 137 Mcg/spray Soln (Azelastine hcl) .... 2 sprays each nostril two times a day 3)  Calcium 500/vitamin D 500-125 Mg-unit Tabs (Calcium carbonate-vitamin d) .... Take as directed 4)  Nasonex 50 Mcg/act Susp (Mometasone furoate) .... 2 sprays each nostril once daily 5)  Zyrtec Allergy 10 Mg Caps (Cetirizine hcl) .... Take 1 tab once daily 6)  Klor-con 10 10 Meq Cr-tabs (Potassium chloride) .... Take 1 by mouth once daily 7)  Ceftin 500 Mg Tabs (Cefuroxime axetil) .Marland Kitchen.. 1 by mouth two times a day 8)  Prednisone 10 Mg Tabs (Prednisone) .... 3 by mouth once daily for 3 days then 2 poqd for 3 days then 1 by mouth once daily for 3 days  Other Orders: Admin of Therapeutic Inj   intramuscular or subcutaneous ([address]) Depo- Medrol 80mg  (J1040) Prescriptions: PREDNISONE 10 MG TABS (PREDNISONE) 3 by mouth once daily for 3 days then 2 poqd for 3 days then 1 by mouth once daily for 3 days  #18 x 0   Entered and Authorized by:   Loreen Freud DO   Signed by:   Loreen Freud DO on 01/06/2010   Method used:   Electronically to        [facility] #[id number]* (retail)       [address]       [place], [place]  [address]       Ph: [phone]       Fax: [phone]   RxID:   [id number] CEFTIN 500 MG TABS (CEFUROXIME AXETIL) 1 by mouth two times a day  #20 x 0   Entered and Authorized by:   Loreen Freud DO   Signed by:   Loreen Freud DO on 01/06/2010   Method used:   Electronically to        [facility] #[id number]* (retail)       [address]  [place], [place]  [address]       Ph: [phone]       Fax: [phone]   RxID:   [id number]    Medication Administration  Injection # 1:    Medication: Depo- Medrol 80mg     Diagnosis: SINUSITIS, ACUTE NOS (ICD-461.9)    Route: IM    Site: RUOQ gluteus    Exp Date: 09/2010    Lot #: [id number]    Mfr: [email or web address]    Patient tolerated injection without complications    Given by: Army Fossa CMA (January 06, 2010 10:19 AM)  Orders Added: 1)  Est. Patient Level III [44010] 2)  Admin of Therapeutic Inj  intramuscular or subcutaneous [96372] 3)  Depo- Medrol 80mg  [J1040]

## 2010-12-12 NOTE — Assessment & Plan Note (Signed)
Summary: SINUS INFECTION, COUGH, SORE THROAT--NO FEVER///SPH   Vital Signs:  Patient profile:   55 year old female Weight:      118.50 pounds Temp:     98.3 degrees F oral Pulse rate:   80 / minute Pulse rhythm:   regular BP sitting:   120 / 74  (left arm) Cuff size:   regular  Vitals Entered By: Army Fossa CMA (May 02, 2010 9:50 AM) CC: Pt here c/o sinus infection, pt said her head feels swimmy, URI symptoms   History of Present Illness:       This is a 55 year old woman who presents with URI symptoms.  The symptoms began 1 week ago.  Pt c/o st and sinus pressure for over 1 week.  The patient complains of nasal congestion, purulent nasal discharge, sore throat, productive cough, earache, and sick contacts.  The patient denies fever, low-grade fever (<100.5 degrees), fever of 100.5-103 degrees, fever of 103.1-104 degrees, fever to >104 degrees, stiff neck, dyspnea, wheezing, rash, vomiting, diarrhea, use of an antipyretic, and response to antipyretic.  The patient also reports headache.  The patient denies itchy watery eyes, itchy throat, sneezing, seasonal symptoms, response to antihistamine, muscle aches, and severe fatigue.  The patient denies the following risk factors for Strep sinusitis: unilateral facial pain, unilateral nasal discharge, poor response to decongestant, double sickening, tooth pain, Strep exposure, tender adenopathy, and absence of cough.  Pt is taking her regular meds and taking zyrtec and nose spray.  She also tried mucinex.    Current Medications (verified): 1)  Diovan Hct 160-12.5 Mg Tabs (Valsartan-Hydrochlorothiazide) .... Take One Tablet Daily 2)  Astepro 137 Mcg/spray  Soln (Azelastine Hcl) .... 2 Sprays Each Nostril Two Times A Day 3)  Calcium 500/vitamin D 500-125 Mg-Unit Tabs (Calcium Carbonate-Vitamin D) .... Take As Directed 4)  Nasonex 50 Mcg/act Susp (Mometasone Furoate) .... 2 Sprays Each Nostril Once Daily 5)  Xyzal 5 Mg Tabs (Levocetirizine  Dihydrochloride) .Marland Kitchen.. 1 By Mouth Once Daily 6)  Klor-Con 10 10 Meq Cr-Tabs (Potassium Chloride) .... Take 1 By Mouth Once Daily 7)  Ceftin 500 Mg Tabs (Cefuroxime Axetil) .Marland Kitchen.. 1 By Mouth Two Times A Day  Allergies: 1)  ! Erythromycin 2)  ! Ace Inhibitors  Past History:  Past medical, surgical, family and social histories (including risk factors) reviewed for relevance to current acute and chronic problems.  Past Medical History: Reviewed history from 03/18/2007 and no changes required. Skin cancer, hx of, Basal cell Hypertension  Past Surgical History: Reviewed history from 09/29/2007 and no changes required. myomectomy 03/2001  Family History: Reviewed history from 09/29/2007 and no changes required. Family History Diabetes 1st degree relative Family History Hypertension Family History of Skin cancer, melanoma Family History of CAD Female 1st degree relative <50 copd  Social History: Reviewed history from 03/18/2007 and no changes required. 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   Mouth:  Oral mucosa and oropharynx without lesions or exudates.  Teeth in good repair.pharyngeal erythema and postnasal drip.   Neck:  No deformities, masses, or tenderness noted. Lungs:  Normal respiratory effort, chest expands symmetrically. Lungs are clear to auscultation, no crackles or wheezes. Heart:  normal rate and no murmur.   Cervical Nodes:  No lymphadenopathy noted Psych:  Cognition and judgment appear intact. Alert and cooperative with normal attention span and concentration. No apparent delusions, illusions,  hallucinations   Impression & Recommendations:  Problem # 1:  SINUSITIS - ACUTE-NOS (ICD-461.9)  Her updated medication list for this problem includes:    Astepro 137 Mcg/spray Soln (Azelastine hcl) .Marland Kitchen... 2 sprays each nostril two times a day    Nasonex 50 Mcg/act Susp (Mometasone furoate) .Marland Kitchen... 2 sprays each nostril once daily    Ceftin 500 Mg Tabs (Cefuroxime axetil) .Marland Kitchen... 1 by mouth two times a day    xyzal 1 by mouth once daily ------- zyrtec stopped working--- pt has tried Careers adviser and claritin --had no relief  Instructed on treatment. Call if symptoms persist or worsen.   Complete Medication List: 1)  Diovan Hct 160-12.5 Mg Tabs (Valsartan-hydrochlorothiazide) .... Take one tablet daily 2)  Astepro 137 Mcg/spray Soln (Azelastine hcl) .... 2 sprays each nostril two times a day 3)  Calcium 500/vitamin D 500-125 Mg-unit Tabs (Calcium carbonate-vitamin d) .... Take as directed 4)  Nasonex 50 Mcg/act Susp (Mometasone furoate) .... 2 sprays each nostril once daily 5)  Xyzal 5 Mg Tabs (Levocetirizine dihydrochloride) .Marland Kitchen.. 1 by mouth once daily 6)  Klor-con 10 10 Meq Cr-tabs (Potassium chloride) .... Take 1 by mouth once daily 7)  Ceftin 500 Mg Tabs (Cefuroxime axetil) .Marland Kitchen.. 1 by mouth two times a day Prescriptions: XYZAL 5 MG TABS (LEVOCETIRIZINE DIHYDROCHLORIDE) 1 by mouth once daily  #30 x 11   Entered and Authorized by:   Loreen Freud DO   Signed by:   Loreen Freud DO on 05/02/2010   Method used:   Electronically to        [facility] #[id number]* (retail)       [address]       [place], [place]  [address]       Ph: [phone]       Fax: [phone]   RxID:   [id number] CEFTIN 500 MG TABS (CEFUROXIME AXETIL) 1 by mouth two times a day  #20 x 0   Entered and Authorized by:   Loreen Freud DO   Signed by:   Loreen Freud DO on 05/02/2010   Method used:   Electronically to        [facility] #[id number]* (retail)       [address]       [place], [place]  [address]       Ph: [phone]       Fax: [phone]   RxID:   [id number]

## 2010-12-12 NOTE — Assessment & Plan Note (Signed)
Summary: CPX AND FASTING LABS///SPH   Vital Signs:  Patient profile:   55 year old female Height:      66 inches Weight:      123.2 pounds BMI:     19.96 Temp:     98.3 degrees F oral Pulse rate:   80 / minute Pulse rhythm:   regular BP sitting:   120 / 82  (right arm) Cuff size:   regular  Vitals Entered By: Almeta Monas CMA Duncan Dull) (September 13, 2010 9:14 AM) CC: CPX/Fasting-- Pap not needed and Flu vaccine given at [facility]   History of Present Illness: Pt here for cpe and labs.  No complaints.    Preventive Screening-Counseling & Management  Alcohol-Tobacco     Alcohol drinks/day: <1     Alcohol type: wine     Smoking Status: never     Passive Smoke Exposure: no  Caffeine-Diet-Exercise     Caffeine use/day: 1     Does Patient Exercise: yes     Type of exercise: yoga, cardio     Times/week: 5  Hep-HIV-STD-Contraception     HIV Risk: no     Dental Visit-last 6 months yes     Dental Care Counseling: not indicated; dental care within six months     SBE monthly: yes     SBE Education/Counseling: not indicated; SBE done regularly     Sun Exposure-Excessive: yes  Safety-Violence-Falls     Seat Belt Use: 100      Sexual History:  currently monogamous.    Problems Prior to Update: 1)  Allergic Rhinitis Cause Unspecified  (ICD-477.9) 2)  Sinusitis - Acute-nos  (ICD-461.9) 3)  Carcinoma, Basal Cell  (ICD-173.9) 4)  Nevi, Multiple  (ICD-216.9) 5)  Acute Frontal Sinusitis  (ICD-461.1) 6)  Cough  (ICD-786.2) 7)  Preventive Health Care  (ICD-V70.0) 8)  Family History of Cad Female 1st Degree Relative <50  (ICD-V17.3) 9)  A A A  (ICD-441.4) 10)  Vaccine Against Dtp, Dtap  (ICD-V06.1) 11)  Shoulder Pain, Left  (ICD-719.41) 12)  Family History Diabetes 1st Degree Relative  (ICD-V18.0) 13)  Hypertension  (ICD-401.9) 14)  Skin Cancer, Hx of  (ICD-V10.83) 15)  Carcinoma, Basal Cell  (ICD-173.9) 16)  Sinusitis, Acute Nos  (ICD-461.9)  Medications Prior to Update: 1)   Diovan Hct 160-12.5 Mg Tabs (Valsartan-Hydrochlorothiazide) .... Take One Tablet Daily 2)  Astepro 137 Mcg/spray  Soln (Azelastine Hcl) .... 2 Sprays Each Nostril Two Times A Day 3)  Calcium 500/vitamin D 500-125 Mg-Unit Tabs (Calcium Carbonate-Vitamin D) .... Take As Directed 4)  Nasonex 50 Mcg/act Susp (Mometasone Furoate) .... 2 Sprays Each Nostril Once Daily 5)  Xyzal 5 Mg Tabs (Levocetirizine Dihydrochloride) .Marland Kitchen.. 1 By Mouth Once Daily 6)  Klor-Con 10 10 Meq Cr-Tabs (Potassium Chloride) .... Take 1 By Mouth Once Daily**labs Due Now*  Current Medications (verified): 1)  Diovan Hct 160-12.5 Mg Tabs (Valsartan-Hydrochlorothiazide) .... Take One Tablet Daily 2)  Astepro 137 Mcg/spray  Soln (Azelastine Hcl) .... 2 Sprays Each Nostril Two Times A Day 3)  Calcium 500/vitamin D 500-125 Mg-Unit Tabs (Calcium Carbonate-Vitamin D) .... Take As Directed 4)  Nasonex 50 Mcg/act Susp (Mometasone Furoate) .... 2 Sprays Each Nostril Once Daily 5)  Xyzal 5 Mg Tabs (Levocetirizine Dihydrochloride) .Marland Kitchen.. 1 By Mouth Once Daily 6)  Klor-Con 10 10 Meq Cr-Tabs (Potassium Chloride) .... Take 1 By Mouth Once Daily**labs Due Now*  Allergies (verified): 1)  ! Erythromycin 2)  ! 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 Family History Diabetes 1st degree relative Family History Hypertension Family History of Skin cancer, melanoma Family History of CAD Female 1st degree relative <50 copd  Social History: Reviewed history from 03/18/2007 and no changes required. 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 Eyes:  Denies blurring, discharge, double vision, eye irritation, eye pain, halos, itching, light sensitivity, red eye, vision loss-1 eye, and vision loss-both eyes; optho q1y. ENT:  Denies decreased hearing, difficulty swallowing, ear discharge, earache, hoarseness, nasal congestion, nosebleeds, postnasal drainage, ringing in ears, sinus pressure, and sore throat. CV:  Denies bluish discoloration of lips or nails, chest pain or discomfort, difficulty breathing at night, difficulty breathing while lying down, fainting, fatigue, leg cramps with exertion, lightheadness, near fainting, palpitations, shortness of breath with exertion, swelling of feet, swelling of hands, and weight gain. Resp:  Denies chest discomfort, chest pain with inspiration, cough, coughing up blood, excessive snoring, hypersomnolence, morning headaches, pleuritic, shortness of breath, sputum productive, and wheezing. GI:  Denies abdominal pain, bloody stools, change in bowel habits, constipation, dark tarry stools, diarrhea, excessive appetite, gas, hemorrhoids, indigestion, loss of appetite, nausea, vomiting, vomiting blood, and yellowish skin color. GU:  Denies abnormal vaginal bleeding, decreased libido, discharge, dysuria, genital sores, hematuria, incontinence, nocturia, urinary frequency, and urinary hesitancy. MS:  Denies joint  pain, joint redness, joint swelling, loss of strength, low back pain, mid back pain, muscle aches, muscle , cramps, muscle weakness, stiffness, and thoracic pain. Derm:  Denies changes in color of skin, changes in nail beds, dryness, excessive perspiration, flushing, hair loss, insect bite(s), itching, lesion(s), poor wound healing, and rash. Neuro:  Denies brief paralysis, difficulty with concentration, disturbances in coordination, falling down, headaches, inability to speak, memory loss, numbness, poor balance, seizures, sensation of room spinning, tingling, tremors, visual disturbances, and weakness. Psych:  Denies alternate hallucination ( auditory/visual), anxiety, depression, easily angered, easily tearful, irritability, mental problems, panic attacks, sense of great danger, suicidal thoughts/plans, thoughts of violence, unusual visions or sounds, and thoughts /plans of harming others. Endo:  Denies cold intolerance, excessive hunger, excessive thirst, excessive urination, heat intolerance, polyuria, and weight change. Heme:  Denies abnormal bruising, bleeding, enlarge lymph nodes, fevers, pallor, and skin discoloration. Allergy:  Denies hives or rash, itching eyes, persistent infections, seasonal allergies, and sneezing.  Physical Exam  General:  Well-developed,well-nourished,in no acute distress; alert,appropriate and cooperative throughout examination Head:  Normocephalic and atraumatic without obvious abnormalities. No apparent alopecia or balding. Eyes:  pupils equal, pupils round, pupils reactive to light, and no injection.   Ears:  External ear exam shows no significant lesions or deformities.  Otoscopic examination reveals clear canals, tympanic membranes are  intact bilaterally without bulging, retraction, inflammation or discharge. Hearing is grossly normal bilaterally. Nose:  External nasal examination shows no deformity or inflammation. Nasal mucosa are pink and moist without lesions  or exudates. Mouth:  Oral mucosa and oropharynx without lesions or exudates.  Teeth in good repair. Neck:  No deformities, masses, or tenderness noted. Chest Wall:  No deformities, masses, or tenderness noted. Breasts:  gyn Lungs:  Normal respiratory effort, chest expands symmetrically. Lungs are clear to auscultation, no crackles or wheezes. Heart:  normal rate and no murmur.   Abdomen:  Bowel sounds positive,abdomen soft and non-tender without masses, organomegaly or hernias noted. Rectal:  gyn Genitalia:  gyn Msk:  normal ROM, no joint tenderness, no joint swelling, no joint warmth, no redness over joints, no joint deformities, no joint instability, and no crepitation.   Pulses:  R popliteal normal, R posterior tibial normal, R dorsalis pedis normal, L popliteal normal, L posterior tibial normal, and L dorsalis pedis normal.  R popliteal normal, R posterior tibial normal, R dorsalis pedis normal, L popliteal normal, L posterior tibial normal, and L dorsalis pedis normal.   Extremities:  No clubbing, cyanosis, edema, or deformity noted with normal full range of motion of all joints.   Neurologic:  No cranial nerve deficits noted. Station and gait are normal. Plantar reflexes are down-going bilaterally. DTRs are symmetrical throughout. Sensory, motor and coordinative functions appear intact. Skin:  Intact without suspicious lesions or rashes Cervical Nodes:  No lymphadenopathy noted Axillary Nodes:  No palpable lymphadenopathy Psych:  Cognition and judgment appear intact. Alert and cooperative with normal attention span and concentration. No apparent delusions, illusions, hallucinations   Impression & Recommendations:  Problem # 1:  PREVENTIVE HEALTH CARE (ICD-V70.0)  Orders: Venipuncture (40102) TLB-CBC Platelet - w/Differential (85025-CBCD) T- * Misc. Laboratory test [phone]) Specimen Handling (64403) EKG w/ Interpretation (93000)  Problem # 2:  HYPERTENSION (ICD-401.9)  Her  updated medication list for this problem includes:    Diovan Hct 160-12.5 Mg Tabs (Valsartan-hydrochlorothiazide) .Marland Kitchen... Take one tablet daily  Orders: Venipuncture (47425) TLB-CBC Platelet - w/Differential (85025-CBCD) T- * Misc. Laboratory test [phone]) EKG w/ Interpretation (93000)  BP today: 120/82 Prior BP: 120/74 (05/02/2010)  Labs Reviewed: K+: 4.0 (08/23/2009) Creat: : 0.6 (07/01/2009)   Chol: 188 (07/01/2009)   HDL: 78.70 (07/01/2009)   LDL: 88 (07/01/2009)   TG: 105.0 (07/01/2009)  Her updated medication list for this problem includes:    Diovan Hct 160-12.5 Mg Tabs (Valsartan-hydrochlorothiazide) .Marland Kitchen... Take one tablet daily  Problem # 3:  ALLERGIC RHINITIS CAUSE UNSPECIFIED (ICD-477.9)  Her updated medication list for this problem includes:    Astepro 137 Mcg/spray Soln (Azelastine hcl) .Marland Kitchen... 2 sprays each nostril two times a day    Nasonex 50 Mcg/act Susp (Mometasone furoate) .Marland Kitchen... 2 sprays each nostril once daily    Xyzal 5 Mg Tabs (Levocetirizine dihydrochloride) .Marland Kitchen... 1 by mouth once daily  Discussed use of allergy medications and environmental measures.     Her updated medication list for this problem includes:    Astepro 137 Mcg/spray Soln (Azelastine hcl) .Marland Kitchen... 2 sprays each nostril two times a day    Nasonex 50 Mcg/act Susp (Mometasone furoate) .Marland Kitchen... 2 sprays each nostril once daily    Xyzal 5 Mg Tabs (Levocetirizine dihydrochloride) .Marland Kitchen... 1 by mouth once daily  Orders: EKG w/ Interpretation (93000)  Complete Medication List: 1)  Diovan Hct 160-12.5 Mg Tabs (Valsartan-hydrochlorothiazide) .... Take one tablet daily 2)  Astepro 137 Mcg/spray Soln (Azelastine hcl) .Marland KitchenMarland KitchenMarland Kitchen  2 sprays each nostril two times a day 3)  Calcium 500/vitamin D 500-125 Mg-unit Tabs (Calcium carbonate-vitamin d) .... Take as directed 4)  Nasonex 50 Mcg/act Susp (Mometasone furoate) .... 2 sprays each nostril once daily 5)  Xyzal 5 Mg Tabs (Levocetirizine dihydrochloride) .Marland Kitchen.. 1 by mouth  once daily 6)  Klor-con 10 10 Meq Cr-tabs (Potassium chloride) .... Take 1 by mouth once daily**labs due now*  Patient Instructions: 1)  we will call you when your blood work comes in to make an appointment Prescriptions: KLOR-CON 10 10 MEQ CR-TABS (POTASSIUM CHLORIDE) take 1 by mouth once daily**LABS DUE NOW*  #90 x 3   Entered and Authorized by:   Loreen Freud DO   Signed by:   Loreen Freud DO on 09/13/2010   Method used:   Electronically to        [id number]* (retail)       [address]       [place], [place]  [address]       Ph: [phone]       Fax: [phone]   RxID:   [id number] ASTEPRO 137 MCG/SPRAY  SOLN (AZELASTINE HCL) 2 sprays each nostril two times a day  #1 x 11   Entered and Authorized by:   Loreen Freud DO   Signed by:   Loreen Freud DO on 09/13/2010   Method used:   Electronically to        [id number]* (retail)       [address]       [place], [place]  [address]       Ph: [phone]       Fax: [phone]   RxID:   [id number] DIOVAN HCT 160-12.5 MG TABS (VALSARTAN-HYDROCHLOROTHIAZIDE) Take one tablet daily  #90 Each x 3   Entered and Authorized by:   Loreen Freud DO   Signed by:   Loreen Freud DO on 09/13/2010   Method used:   Electronically to        [id number]* (retail)       [address]       [place], [place]  [address]       Ph: [phone]       Fax: [phone]   RxID:   [id number]    Orders Added: 1)  Venipuncture [94854] 2)  TLB-CBC Platelet - w/Differential [85025-CBCD] 3)  T- * Misc. Laboratory test [phone] 4)  Specimen Handling [99000] 5)  Est. Patient 40-64 years [99396] 6)  EKG w/ Interpretation [93000]    Flu Vaccine Result Date:  08/09/2010 Flu Vaccine Result:  given Flu Vaccine Next Due:  1 yr Last PAP:  normal (04/27/2009 9:00:22 AM) PAP Result  Date:  06/28/2010 PAP Result:  normal PAP Next Due:  1 yr Last Mammogram:  normal bilateral (07/07/2007 10:27:18 AM) Mammogram Result Date:  07/05/2010 Mammogram Result:  normal Mammogram Next Due:  1 yr

## 2010-12-14 NOTE — Assessment & Plan Note (Signed)
Summary: URI//lch   Vital Signs:  Patient profile:   55 year old female Weight:      123.4 pounds O2 Sat:      100 % on Room air Temp:     97.9 degrees F oral Pulse rate:   70 / minute Pulse rhythm:   regular BP sitting:   110 / 76  (left arm) Cuff size:   regular  Vitals Entered By: Almeta Monas CMA Duncan Dull) (November 27, 2010 3:03 PM)  O2 Flow:  Room air CC: x1week c/o congestion, cough, stuffy nose, and sinus pressure, URI symptoms   History of Present Illness:       This is a 55 year old woman who presents with URI symptoms.  The symptoms began 2 weeks ago.  + b/l sinus pressure/ headache.  The patient complains of nasal congestion, purulent nasal discharge, productive cough, earache, and sick contacts.  The patient denies fever, low-grade fever (<100.5 degrees), fever of 100.5-103 degrees, fever of 103.1-104 degrees, fever to >104 degrees, stiff neck, dyspnea, wheezing, rash, vomiting, diarrhea, use of an antipyretic, and response to antipyretic.  The patient also reports headache.  The patient denies the following risk factors for Strep sinusitis: unilateral facial pain, unilateral nasal discharge, poor response to decongestant, double sickening, tooth pain, Strep exposure, tender adenopathy, and absence of cough.    Problems Prior to Update: 1)  Allergic Rhinitis Cause Unspecified  (ICD-477.9) 2)  Sinusitis - Acute-nos  (ICD-461.9) 3)  Carcinoma, Basal Cell  (ICD-173.9) 4)  Nevi, Multiple  (ICD-216.9) 5)  Acute Frontal Sinusitis  (ICD-461.1) 6)  Cough  (ICD-786.2) 7)  Preventive Health Care  (ICD-V70.0) 8)  Family History of Cad Female 1st Degree Relative <50  (ICD-V17.3) 9)  A A A  (ICD-441.4) 10)  Vaccine Against Dtp, Dtap  (ICD-V06.1) 11)  Shoulder Pain, Left  (ICD-719.41) 12)  Family History Diabetes 1st Degree Relative  (ICD-V18.0) 13)  Hypertension  (ICD-401.9) 14)  Skin Cancer, Hx of  (ICD-V10.83) 15)  Carcinoma, Basal Cell  (ICD-173.9) 16)  Sinusitis, Acute Nos   (ICD-461.9)  Medications Prior to Update: 1)  Diovan Hct 160-12.5 Mg Tabs (Valsartan-Hydrochlorothiazide) .... Take One Tablet Daily 2)  Astepro 137 Mcg/spray  Soln (Azelastine Hcl) .... 2 Sprays Each Nostril Two Times A Day 3)  Calcium 500/vitamin D 500-125 Mg-Unit Tabs (Calcium Carbonate-Vitamin D) .... Take As Directed 4)  Nasonex 50 Mcg/act Susp (Mometasone Furoate) .... 2 Sprays Each Nostril Once Daily 5)  Xyzal 5 Mg Tabs (Levocetirizine Dihydrochloride) .Marland Kitchen.. 1 By Mouth Once Daily 6)  Klor-Con 10 10 Meq Cr-Tabs (Potassium Chloride) .... Take 1 By Mouth Once Daily**labs Due Now*  Current Medications (verified): 1)  Diovan Hct 160-12.5 Mg Tabs (Valsartan-Hydrochlorothiazide) .... Take One Tablet Daily 2)  Astepro 137 Mcg/spray  Soln (Azelastine Hcl) .... 2 Sprays Each Nostril Two Times A Day 3)  Calcium 500/vitamin D 500-125 Mg-Unit Tabs (Calcium Carbonate-Vitamin D) .... Take As Directed 4)  Nasonex 50 Mcg/act Susp (Mometasone Furoate) .... 2 Sprays Each Nostril Once Daily 5)  Xyzal 5 Mg Tabs (Levocetirizine Dihydrochloride) .Marland Kitchen.. 1 By Mouth Once Daily 6)  Klor-Con 10 10 Meq Cr-Tabs (Potassium Chloride) .... Take 1 By Mouth Once Daily**labs Due Now* 7)  Avelox 400 Mg Tabs (Moxifloxacin Hcl) .Marland Kitchen.. 1 By Mouth Once Daily  Allergies (verified): 1)  ! Erythromycin 2)  ! 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 Family History Diabetes 1st degree relative Family History Hypertension Family History of Skin cancer, melanoma Family History of CAD Female 1st degree relative <50 copd  Social History: Reviewed history from 03/18/2007 and no changes required. 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  Otoscopic examination reveals clear canals, tympanic membranes are intact bilaterally without bulging, retraction, inflammation or discharge. Hearing is grossly normal bilaterally. Nose:  L frontal sinus tenderness, L maxillary sinus tenderness, R frontal sinus tenderness, and R maxillary sinus tenderness.   Mouth:  Oral mucosa and oropharynx without lesions or exudates.  Teeth in good repair. Neck:  No deformities, masses, or tenderness noted. Lungs:  Normal respiratory effort, chest expands symmetrically. Lungs are clear to auscultation, no crackles or wheezes. Extremities:  No clubbing, cyanosis, edema, or deformity noted with normal full range of motion of all joints.   Skin:  Intact without suspicious lesions or rashes Cervical Nodes:  No lymphadenopathy noted Psych:  Cognition and judgment appear intact. Alert and cooperative with normal attention span and concentration. No apparent delusions, illusions, hallucinations   Impression & Recommendations:  Problem # 1:  SINUSITIS - ACUTE-NOS (ICD-461.9)  Her updated medication list for this problem includes:    Astepro 137 Mcg/spray Soln  (Azelastine hcl) .Marland Kitchen... 2 sprays each nostril two times a day    Nasonex 50 Mcg/act Susp (Mometasone furoate) .Marland Kitchen... 2 sprays each nostril once daily    Avelox 400 Mg Tabs (Moxifloxacin hcl) .Marland Kitchen... 1 by mouth once daily  Instructed on treatment. Call if symptoms persist or worsen.   Complete Medication List: 1)  Diovan Hct 160-12.5 Mg Tabs (Valsartan-hydrochlorothiazide) .... Take one tablet daily 2)  Astepro 137 Mcg/spray Soln (Azelastine hcl) .... 2 sprays each nostril two times a day 3)  Calcium 500/vitamin D 500-125 Mg-unit Tabs (Calcium carbonate-vitamin d) .... Take as directed 4)  Nasonex 50 Mcg/act Susp (Mometasone furoate) .... 2 sprays each nostril once daily 5)  Xyzal 5 Mg Tabs (Levocetirizine dihydrochloride) .Marland Kitchen.. 1 by mouth once daily 6)  Klor-con 10 10 Meq Cr-tabs (Potassium chloride) .... Take 1 by mouth once daily**labs due now* 7)  Avelox 400 Mg Tabs (Moxifloxacin hcl) .Marland Kitchen.. 1 by mouth once daily Prescriptions: AVELOX 400 MG TABS (MOXIFLOXACIN HCL) 1 by mouth once daily  #10 x 0   Entered and Authorized by:   Loreen Freud DO   Signed by:   Loreen Freud DO on 11/27/2010   Method used:   Electronically to        [id number]* (retail)       [address]       [place], [place]  [address]       Ph: [phone]       Fax: [phone]   RxID:   [id number]    Orders Added: 1)  Est. Patient Level III [38756]

## 2010-12-14 NOTE — Progress Notes (Signed)
Summary: needs to review [facility] labs 12/12  ---- Converted from flag ---- ---- 10/22/2010 10:27 AM, Loreen Freud DO wrote: needs appoint to review labs ------------------------------ Left message to call back... Almeta Monas CMA Duncan Dull)  October 23, 2010 11:57 AM scheduled for friday 8/17 @ 300pm.. Almeta Monas CMA Duncan Dull)  October 23, 2010 3:35 PM

## 2010-12-14 NOTE — Assessment & Plan Note (Signed)
Summary: review [facility]//kp   Vital Signs:  Patient profile:   55 year old female Weight:      121 pounds Pulse rate:   72 / minute Pulse rhythm:   regular BP sitting:   130 / 78  (left arm) Cuff size:   regular  Vitals Entered By: Almeta Monas CMA Duncan Dull) (October 27, 2010 3:06 PM) CC: review [facility]   History of Present Illness: Pt here to review labs.    Problems Prior to Update: 1)  Allergic Rhinitis Cause Unspecified  (ICD-477.9) 2)  Sinusitis - Acute-nos  (ICD-461.9) 3)  Carcinoma, Basal Cell  (ICD-173.9) 4)  Nevi, Multiple  (ICD-216.9) 5)  Acute Frontal Sinusitis  (ICD-461.1) 6)  Cough  (ICD-786.2) 7)  Preventive Health Care  (ICD-V70.0) 8)  Family History of Cad Female 1st Degree Relative <50  (ICD-V17.3) 9)  A A A  (ICD-441.4) 10)  Vaccine Against Dtp, Dtap  (ICD-V06.1) 11)  Shoulder Pain, Left  (ICD-719.41) 12)  Family History Diabetes 1st Degree Relative  (ICD-V18.0) 13)  Hypertension  (ICD-401.9) 14)  Skin Cancer, Hx of  (ICD-V10.83) 15)  Carcinoma, Basal Cell  (ICD-173.9) 16)  Sinusitis, Acute Nos  (ICD-461.9)  Medications Prior to Update: 1)  Diovan Hct 160-12.5 Mg Tabs (Valsartan-Hydrochlorothiazide) .... Take One Tablet Daily 2)  Astepro 137 Mcg/spray  Soln (Azelastine Hcl) .... 2 Sprays Each Nostril Two Times A Day 3)  Calcium 500/vitamin D 500-125 Mg-Unit Tabs (Calcium Carbonate-Vitamin D) .... Take As Directed 4)  Nasonex 50 Mcg/act Susp (Mometasone Furoate) .... 2 Sprays Each Nostril Once Daily 5)  Xyzal 5 Mg Tabs (Levocetirizine Dihydrochloride) .Marland Kitchen.. 1 By Mouth Once Daily 6)  Klor-Con 10 10 Meq Cr-Tabs (Potassium Chloride) .... Take 1 By Mouth Once Daily**labs Due Now*  Current Medications (verified): 1)  Diovan Hct 160-12.5 Mg Tabs (Valsartan-Hydrochlorothiazide) .... Take One Tablet Daily 2)  Astepro 137 Mcg/spray  Soln (Azelastine Hcl) .... 2 Sprays Each Nostril Two Times A Day 3)  Calcium 500/vitamin D 500-125 Mg-Unit Tabs  (Calcium Carbonate-Vitamin D) .... Take As Directed 4)  Nasonex 50 Mcg/act Susp (Mometasone Furoate) .... 2 Sprays Each Nostril Once Daily 5)  Xyzal 5 Mg Tabs (Levocetirizine Dihydrochloride) .Marland Kitchen.. 1 By Mouth Once Daily 6)  Klor-Con 10 10 Meq Cr-Tabs (Potassium Chloride) .... Take 1 By Mouth Once Daily**labs Due Now*  Allergies (verified): 1)  ! Erythromycin 2)  ! 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 Family History Diabetes 1st degree relative Family History Hypertension Family History of Skin cancer, melanoma Family History of CAD Female 1st degree relative <50 copd  Social History: Reviewed history from 03/18/2007 and no changes required. 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     Impression & Recommendations:  Problem # 1:  FAMILY HISTORY OF CAD FEMALE 1ST DEGREE RELATIVE <50 (ICD-V17.3)  Problem # 2:   HYPERTENSION (ICD-401.9) [facility] d/w pt--recheck 1 year  Her updated medication list for this problem includes:    Diovan Hct 160-12.5 Mg Tabs (Valsartan-hydrochlorothiazide) .Marland Kitchen... Take one tablet daily  BP today: 130/78 Prior BP: 116/80 (09/19/2010)  Labs Reviewed: K+: 4.0 (08/23/2009) Creat: : 0.6 (07/01/2009)   Chol: 188 (07/01/2009)   HDL: 78.70 (07/01/2009)   LDL: 88 (07/01/2009)   TG: 105.0 (07/01/2009)  Complete Medication List: 1)  Diovan Hct 160-12.5 Mg Tabs (Valsartan-hydrochlorothiazide) .... Take one tablet daily 2)  Astepro 137 Mcg/spray Soln (Azelastine hcl) .... 2 sprays each nostril two times a day 3)  Calcium 500/vitamin D 500-125 Mg-unit Tabs (Calcium carbonate-vitamin d) .... Take as directed 4)  Nasonex 50 Mcg/act Susp (Mometasone furoate) .... 2 sprays each nostril once daily 5)  Xyzal 5 Mg Tabs (Levocetirizine dihydrochloride) .Marland Kitchen.. 1 by mouth once daily 6)  Klor-con 10 10 Meq Cr-tabs (Potassium chloride) .... Take 1 by mouth once daily**labs due now*   Orders Added: 1)  Est. Patient Level III [11914]

## 2011-03-30 NOTE — Op Note (Signed)
[facility]  Patient:    Sherri Hoffman, Sherri Hoffman Visit Number: [id number] MRN: [id number]          Service Type: DSU Location: [facility] Attending Physician:  Esmeralda Arthur Proc. Date: 03/19/01 Admit Date:  03/19/2001 Discharge Date: 03/19/2001                             Operative Report  PREOPERATIVE DIAGNOSIS:       Endometrial polyp.  POSTOPERATIVE DIAGNOSIS:      Pedunculated submucosal fibroid of approximately                               2 cm diameter.  OPERATION:                    Myomectomy via hysteroscopy with D&C.  SURGEON:                      Silverio Lay, M.D.  ANESTHESIA:                   General.  ESTIMATED BLOOD LOSS:         Minimal.  FLUID DEFICIT:                Minus 60 cc.  DESCRIPTION OF PROCEDURE:     After being informed of the planned procedure with possible complications including bleeding, infection, uterine perforation, need for laparoscopy and laparotomy, as well as regrowth of fibroid, the patient was taken to OR #3 and given general anesthesia with laryngeal mask.  She was placed in the lithotomy position, prepped and draped in a sterile fashion.  Gyn exam revealed an anteverted uterus, normal in volume and size, and two normal adnexa.  We inserted a weighted speculum and grasped cervix with tenaculum forceps.  The uterus was sounded at 8.5 cm.  The cervix was dilated with Hegar dilator at #33.  This allowed [place] entry with the operative hysteroscopy with intrauterine perfusion of sorbitol 3% at 100 mmHg maximum pressure.  We then noted a pedunculated submucosal fibroid, filling the cavity with the stalk on the right posterolateral wall.  We were able to go beyond the fibroid and visualize the two tubal ostia which were normal. The endometrium was somewhat thickened, but normal in appearance.  We proceeded with double looped resectoscope and trying to section the stalk, which we succeeded after three passes.  We  removed the hysteroscope and with graspers removed the fibroid from the uterine cavity.  We reentered the uterine cavity with the hysteroscope and visualized normal hemostasis, and also confirmed complete removal of the fibroid.  We then removed the hysteroscope and proceeded with sharp curettage of the uterine cavity to remove normal-appearing endometrial.  Instruments were then completely removed.  Instruments and sponge counts complete x 2.  Estimated blood loss is minimal and fluid deficit is -60 cc. The procedure is very well-tolerated by the patient who is taken to the recovery room in a well and stable condition. Attending Physician:  Esmeralda Arthur DD:  03/19/01 TD:  03/20/01 Job: [id number]

## 2011-05-07 ENCOUNTER — Other Ambulatory Visit: Payer: Self-pay | Admitting: Family Medicine

## 2011-08-01 ENCOUNTER — Ambulatory Visit (INDEPENDENT_AMBULATORY_CARE_PROVIDER_SITE_OTHER): Payer: BC Managed Care – PPO | Admitting: Family Medicine

## 2011-08-01 ENCOUNTER — Encounter: Payer: Self-pay | Admitting: Family Medicine

## 2011-08-01 VITALS — BP 122/78 | HR 74 | Temp 98.7°F | Wt 121.0 lb

## 2011-08-01 DIAGNOSIS — J329 Chronic sinusitis, unspecified: Secondary | ICD-10-CM

## 2011-08-01 MED ORDER — GUAIFENESIN-CODEINE 100-10 MG/5ML PO SYRP: 5.0000 mL | ORAL_SOLUTION | Freq: Two times a day (BID) | ORAL | Status: DC | PRN

## 2011-08-01 MED ORDER — CLARITHROMYCIN ER 500 MG PO TB24: 1000.0000 mg | ORAL_TABLET | Freq: Every day | ORAL | Status: AC

## 2011-08-01 MED ORDER — TRIAMCINOLONE ACETONIDE(NASAL) 55 MCG/ACT NA INHA: 2.0000 | Freq: Every day | NASAL | Status: DC

## 2011-08-01 NOTE — Progress Notes (Signed)
  Subjective:     Sherri Hoffman is a 55 y.o. female who presents for evaluation of sinus pain. Symptoms include: congestion, cough, facial pain, headaches, nasal congestion and sinus pressure. Onset of symptoms was 2 weeks ago. Symptoms have been gradually worsening since that time. Past history is significant for no history of pneumonia or bronchitis. Patient is a non-smoker.  The following portions of the patient's history were reviewed and updated as appropriate: allergies, current medications, past family history, past medical history, past social history, past surgical history and problem list.  Review of Systems Pertinent items are noted in HPI.   Objective:    BP 122/78  Pulse 74  Temp(Src) 98.7 F (37.1 C) (Oral)  Wt 121 lb (54.885 kg)  SpO2 97% General appearance: alert, cooperative, appears stated age and no distress Ears: normal TM's and external ear canals both ears Nose: green discharge, moderate congestion, sinus tenderness bilateral Neck: mild anterior cervical adenopathy, supple, symmetrical, trachea midline and thyroid not enlarged, symmetric, no tenderness/mass/nodules Lungs: clear to auscultation bilaterally Heart: S1, S2 normal Lymph nodes: Cervical, supraclavicular, and axillary nodes normal.    Assessment:    Acute bacterial sinusitis.    Plan:    Neti pot recommended. Instructions given. Nasal steroids per medication orders. Biaxin per medication orders. f/u prn

## 2011-08-01 NOTE — Patient Instructions (Signed)

## 2011-08-16 ENCOUNTER — Encounter: Payer: Self-pay | Admitting: Family Medicine

## 2011-08-27 ENCOUNTER — Other Ambulatory Visit: Payer: Self-pay | Admitting: Family Medicine

## 2011-08-28 ENCOUNTER — Other Ambulatory Visit: Payer: Self-pay | Admitting: Family Medicine

## 2011-09-18 ENCOUNTER — Other Ambulatory Visit: Payer: Self-pay | Admitting: Family Medicine

## 2011-09-26 ENCOUNTER — Encounter: Payer: Self-pay | Admitting: Family Medicine

## 2011-09-27 ENCOUNTER — Ambulatory Visit (INDEPENDENT_AMBULATORY_CARE_PROVIDER_SITE_OTHER): Payer: BC Managed Care – PPO | Admitting: Family Medicine

## 2011-09-27 ENCOUNTER — Encounter: Payer: Self-pay | Admitting: Family Medicine

## 2011-09-27 DIAGNOSIS — E785 Hyperlipidemia, unspecified: Secondary | ICD-10-CM

## 2011-09-27 DIAGNOSIS — Z0000 Encounter for general adult medical examination without abnormal findings: Secondary | ICD-10-CM

## 2011-09-27 DIAGNOSIS — I10 Essential (primary) hypertension: Secondary | ICD-10-CM

## 2011-09-27 DIAGNOSIS — J309 Allergic rhinitis, unspecified: Secondary | ICD-10-CM

## 2011-09-27 DIAGNOSIS — E876 Hypokalemia: Secondary | ICD-10-CM

## 2011-09-27 DIAGNOSIS — Z9109 Other allergy status, other than to drugs and biological substances: Secondary | ICD-10-CM

## 2011-09-27 LAB — POCT URINALYSIS DIPSTICK
Leukocytes, UA: NEGATIVE
Nitrite, UA: NEGATIVE
Protein, UA: NEGATIVE
pH, UA: 7

## 2011-09-27 LAB — CBC WITH DIFFERENTIAL/PLATELET
Basophils Absolute: 0 10*3/uL (ref 0.0–0.1)
Basophils Relative: 0.7 % (ref 0.0–3.0)
Eosinophils Absolute: 0.1 10*3/uL (ref 0.0–0.7)
Hemoglobin: 13 g/dL (ref 12.0–15.0)
Lymphocytes Relative: 44.8 % (ref 12.0–46.0)
MCHC: 33.7 g/dL (ref 30.0–36.0)
Monocytes Relative: 9.2 % (ref 3.0–12.0)
Neutrophils Relative %: 42.3 % — ABNORMAL LOW (ref 43.0–77.0)
RBC: 4.22 Mil/uL (ref 3.87–5.11)

## 2011-09-27 LAB — BASIC METABOLIC PANEL
CO2: 28 mEq/L (ref 19–32)
Calcium: 9.4 mg/dL (ref 8.4–10.5)
Creatinine, Ser: 0.6 mg/dL (ref 0.4–1.2)
GFR: 107.56 mL/min (ref >60.00)
Sodium: 139 mEq/L (ref 135–145)

## 2011-09-27 LAB — HEPATIC FUNCTION PANEL
AST: 25 U/L (ref 0–37)
Alkaline Phosphatase: 39 U/L (ref 39–117)
Bilirubin, Direct: 0 mg/dL (ref 0.0–0.3)
Total Protein: 7.8 g/dL (ref 6.0–8.3)

## 2011-09-27 LAB — LIPID PANEL
HDL: 87.1 mg/dL (ref >39.00)
Total CHOL/HDL Ratio: 2
VLDL: 13.4 mg/dL (ref 0.0–40.0)

## 2011-09-27 MED ORDER — LEVOCETIRIZINE DIHYDROCHLORIDE 5 MG PO TABS: ORAL_TABLET | ORAL | Status: DC

## 2011-09-27 MED ORDER — VALSARTAN-HYDROCHLOROTHIAZIDE 160-12.5 MG PO TABS: ORAL_TABLET | ORAL | Status: DC

## 2011-09-27 MED ORDER — POTASSIUM CHLORIDE 10 MEQ PO TBCR: EXTENDED_RELEASE_TABLET | ORAL | Status: DC

## 2011-09-27 NOTE — Progress Notes (Signed)
  Subjective:     Sherri Hoffman is a 55 y.o. female and is here for a comprehensive physical exam. The patient reports no problems.  History   Social History  . Marital Status: Married    Spouse Name: N/A    Number of Children: N/A  . Years of Education: N/A   Occupational History  . casino flights    Social History Main Topics  . Smoking status: Never Smoker   . Smokeless tobacco: Never Used  . Alcohol Use: 1.8 oz/week    3 Glasses of wine per week  . Drug Use: No  . Sexually Active: Yes -- Female partner(s)   Other Topics Concern  . Not on file   Social History Narrative  . No narrative on file   Health Maintenance  Topic Date Due  . Influenza Vaccine  08/12/2012  . Mammogram  07/30/2013  . Pap Smear  07/27/2014  . Tetanus/tdap  03/17/2017  . Colonoscopy  09/26/2018    The following portions of the patient's history were reviewed and updated as appropriate: allergies, current medications, past family history, past medical history, past social history, past surgical history and problem list.  Review of Systems Review of Systems  Constitutional: Negative for activity change, appetite change and fatigue.  HENT: Negative for hearing loss, congestion, tinnitus and ear discharge.  dentist q32m Eyes: Negative for visual disturbance (see optho q1y -- vision corrected to 20/20 with glasses).  Respiratory: Negative for cough, chest tightness and shortness of breath.   Cardiovascular: Negative for chest pain, palpitations and leg swelling.  Gastrointestinal: Negative for abdominal pain, diarrhea, constipation and abdominal distention.  Genitourinary: Negative for urgency, frequency, decreased urine volume and difficulty urinating.  Musculoskeletal: Negative for back pain, arthralgias and gait problem.  Skin: Negative for color change, pallor and rash. -- derm--Dr Elliot Gurney Neurological: Negative for dizziness, light-headedness, numbness and headaches.  Hematological: Negative for  adenopathy. Does not bruise/bleed easily.  Psychiatric/Behavioral: Negative for suicidal ideas, confusion, sleep disturbance, self-injury, dysphoric mood, decreased concentration and agitation.       Objective:    BP 118/72  Pulse 71  Temp(Src) 98.6 F (37 C) (Oral)  Ht 5\' 11"  (1.803 m)  Wt 118 lb 9.6 oz (53.797 kg)  BMI 16.54 kg/m2  SpO2 96% General appearance: alert, cooperative, appears stated age and no distress Head: Normocephalic, without obvious abnormality, atraumatic Eyes: conjunctivae/corneas clear. PERRL, EOM's intact. Fundi benign. Ears: normal TM's and external ear canals both ears Nose: Nares normal. Septum midline. Mucosa normal. No drainage or sinus tenderness. Throat: lips, mucosa, and tongue normal; teeth and gums normal Neck: no adenopathy, no carotid bruit, no JVD, supple, symmetrical, trachea midline and thyroid not enlarged, symmetric, no tenderness/mass/nodules Back: symmetric, no curvature. ROM normal. No CVA tenderness. Lungs: clear to auscultation bilaterally Breasts: gyn Heart: regular rate and rhythm, S1, S2 normal, no murmur, click, rub or gallop Abdomen: soft, non-tender; bowel sounds normal; no masses,  no organomegaly Pelvic: gyn Extremities: extremities normal, atraumatic, no cyanosis or edema Pulses: 2+ and symmetric Skin: Skin color, texture, turgor normal. No rashes or lesions Lymph nodes: Cervical, supraclavicular, and axillary nodes normal. Neurologic: Alert and oriented X 3, normal strength and tone. Normal symmetric reflexes. Normal coordination and gait psych--no anxiety or depression    Assessment:    Healthy female exam.  htn-- stable , con't meds   Plan:  ghm utd Check fasting labs   See After Visit Summary for Counseling Recommendations

## 2011-09-27 NOTE — Patient Instructions (Signed)

## 2011-10-08 ENCOUNTER — Other Ambulatory Visit: Payer: Self-pay | Admitting: Family Medicine

## 2012-01-29 ENCOUNTER — Ambulatory Visit: Payer: BC Managed Care – PPO | Admitting: Family Medicine

## 2012-01-29 ENCOUNTER — Ambulatory Visit (INDEPENDENT_AMBULATORY_CARE_PROVIDER_SITE_OTHER): Payer: BC Managed Care – PPO | Admitting: Internal Medicine

## 2012-01-29 VITALS — BP 128/82 | HR 74 | Temp 98.7°F | Wt 119.0 lb

## 2012-01-29 DIAGNOSIS — J40 Bronchitis, not specified as acute or chronic: Secondary | ICD-10-CM

## 2012-01-29 MED ORDER — AZELASTINE HCL 0.1 % NA SOLN: 2.0000 | Freq: Every day | NASAL | Status: DC

## 2012-01-29 MED ORDER — AMOXICILLIN 500 MG PO CAPS: 1000.0000 mg | ORAL_CAPSULE | Freq: Two times a day (BID) | ORAL | Status: AC

## 2012-01-29 MED ORDER — HYDROCODONE-HOMATROPINE 5-1.5 MG/5ML PO SYRP: 5.0000 mL | ORAL_SOLUTION | Freq: Three times a day (TID) | ORAL | Status: AC | PRN

## 2012-01-29 NOTE — Patient Instructions (Signed)
Rest, fluids , tylenol For cough, take Mucinex DM twice a day as needed  If the cough continue, use hydrocodone, will make you sleepy For congestion use astelin nose spray until better  Take the antibiotic as prescribed ----> Amoxicillin Call if no better in few days Call anytime if the symptoms are severe

## 2012-01-29 NOTE — Progress Notes (Signed)
  Subjective:    Patient ID: Sherri Hoffman, female    DOB: 1955-12-16, 56 y.o.   MRN: [id number]  HPI Acute visit One-week history of cough, sinus and chest congestion. Cough is particularly bothersome at night.  Past Medical History  Diagnosis Date  . Hypertension   . Skin cancer, basal cell     Review of Systems No fever or chills No nausea, vomiting, diarrhea. No wheezing, no shortness of breath. She's not a smoker, no history of asthma.      Objective:   Physical Exam Alert, oriented x3, no apparent distress. HEENT: Nose congested, is asymmetric, slightly tender at the right maxillary area, tympanic membranes normal. Throat normal. Lungs: A few rhonchi that clear with cough, no wheezing or increased work of breathing. Cardiovascular rate and rhythm.     Assessment & Plan:  Upper respiratory symptoms for a week, tenderness and a right maxillary sinus. Most likely she has a mild bronchitis and sinusitis. See  instructions.

## 2012-01-30 ENCOUNTER — Encounter: Payer: Self-pay | Admitting: Internal Medicine

## 2012-03-02 ENCOUNTER — Other Ambulatory Visit: Payer: Self-pay | Admitting: Family Medicine

## 2012-05-31 ENCOUNTER — Other Ambulatory Visit: Payer: Self-pay | Admitting: Family Medicine

## 2012-06-03 ENCOUNTER — Other Ambulatory Visit: Payer: Self-pay

## 2012-06-03 MED ORDER — ESTRADIOL 1 MG PO TABS: 1.0000 mg | ORAL_TABLET | Freq: Every day | ORAL | Status: DC

## 2012-06-03 NOTE — Telephone Encounter (Signed)
TC to pt.  Refill for Estradiol ok x 1.  Pt will need to make AEX appt before any other refills. Pt to call with any other questions or concerns.  ld

## 2012-07-02 ENCOUNTER — Other Ambulatory Visit: Payer: Self-pay | Admitting: Family Medicine

## 2012-07-07 ENCOUNTER — Ambulatory Visit (INDEPENDENT_AMBULATORY_CARE_PROVIDER_SITE_OTHER): Payer: BC Managed Care – PPO | Admitting: Obstetrics and Gynecology

## 2012-07-07 ENCOUNTER — Encounter: Payer: Self-pay | Admitting: Obstetrics and Gynecology

## 2012-07-07 VITALS — BP 130/76 | HR 66 | Ht 66.0 in | Wt 119.0 lb

## 2012-07-07 DIAGNOSIS — Z124 Encounter for screening for malignant neoplasm of cervix: Secondary | ICD-10-CM

## 2012-07-07 DIAGNOSIS — Z01419 Encounter for gynecological examination (general) (routine) without abnormal findings: Secondary | ICD-10-CM

## 2012-07-07 MED ORDER — PROGESTERONE MICRONIZED 100 MG PO CAPS: 100.0000 mg | ORAL_CAPSULE | Freq: Every day | ORAL | Status: AC

## 2012-07-07 MED ORDER — ESTRADIOL 1 MG PO TABS: 1.0000 mg | ORAL_TABLET | Freq: Every day | ORAL | Status: AC

## 2012-07-07 NOTE — Progress Notes (Signed)
The patient is taking hormone replacement therapy The patient  is taking a Calcium supplement. Post-menopausal bleeding:no  Last Pap: was normal June  2012 Last mammogram: was normal September  2012 Last DEXA scan : n/a    Last colonoscopy:normal February 2010  Urinary symptoms: none Normal bowel movements: Yes Reports abuse at home: No:  Subjective:    Sherri Hoffman is a 56 y.o. female [id number] who presents for annual exam.  The patient has no complaints today.   The following portions of the patient's history were reviewed and updated as appropriate: allergies, current medications, past family history, past medical history, past social history, past surgical history and problem list.  Review of Systems Pertinent items are noted in HPI. Gastrointestinal:No change in bowel habits, no abdominal pain, no rectal bleeding Genitourinary:negative for dysuria, frequency, hematuria, nocturia and urinary incontinence    Objective:     BP 130/76  Pulse 66  Ht 5\' 6"  (1.676 m)  Wt 119 lb (53.978 kg)  BMI 19.21 kg/m2  Weight:  Wt Readings from Last 1 Encounters:  07/07/12 119 lb (53.978 kg)     BMI: Body mass index is 19.21 kg/(m^2). General Appearance: Alert, appropriate appearance for age. No acute distress HEENT: Grossly normal Neck / Thyroid: Supple, no masses, nodes or enlargement Lungs: clear to auscultation bilaterally Back: No CVA tenderness Breast Exam: No masses or nodes.No dimpling, nipple retraction or discharge. Cardiovascular: Regular rate and rhythm. S1, S2, no murmur Gastrointestinal: Soft, non-tender, no masses or organomegaly Pelvic Exam: Vulva and vagina appear normal. Bimanual exam reveals normal uterus and adnexa.RV Rectovaginal: normal rectal, no masses Lymphatic Exam: Non-palpable nodes in neck, clavicular, axillary, or inguinal regions Skin: no rash or abnormalities Neurologic: Normal gait and speech, no tremor  Psychiatric: Alert and oriented, appropriate  affect.    Urinalysis:Not done    Assessment:    Normal gyn exam  Vaginal dryness   Plan:   mammogram pap smear return annually or prn Follow-up:  for annual exam HRT: pt satisfied and desires to continue Recommend silicone-based lubricant

## 2012-07-08 LAB — PAP IG W/ RFLX HPV ASCU

## 2012-08-07 ENCOUNTER — Encounter: Payer: Self-pay | Admitting: Obstetrics and Gynecology

## 2012-08-07 NOTE — Progress Notes (Signed)
Quick Note:  Please send "Dense breast" letter to patient and document in chart when letter is sent. ______ 

## 2012-08-11 ENCOUNTER — Telehealth: Payer: Self-pay | Admitting: Family Medicine

## 2012-08-11 MED ORDER — VALSARTAN-HYDROCHLOROTHIAZIDE 160-12.5 MG PO TABS: 1.0000 | ORAL_TABLET | Freq: Every day | ORAL | Status: DC

## 2012-08-11 NOTE — Telephone Encounter (Signed)
refill DIOVAN HCT 160-12.5 MG #90 last fill 7.24.13 TK ONE PO D --last ov 3.19.13 acute wt/paz Last ov 11.15.12 V70

## 2012-08-11 NOTE — Telephone Encounter (Signed)
Please offer this patient an apt with Dr.Lowne.     KP 

## 2012-08-12 ENCOUNTER — Telehealth: Payer: Self-pay

## 2012-08-12 NOTE — Telephone Encounter (Signed)
Called pt lmohp to call me back. Pt needs to schedule her CPE after 11.15.12 can offer 11.18.13 1pm.

## 2012-08-12 NOTE — Telephone Encounter (Signed)
Due to pt work/vacation schedule she refused lat appt for this yr on 12.30.13 Pt prefers 830 am appt had to put her in 1.10.14 please fill prescriptions til then if possible ALSO note I have added to wait list

## 2012-09-03 ENCOUNTER — Other Ambulatory Visit: Payer: Self-pay | Admitting: Family Medicine

## 2012-09-03 DIAGNOSIS — Z9109 Other allergy status, other than to drugs and biological substances: Secondary | ICD-10-CM

## 2012-09-03 MED ORDER — LEVOCETIRIZINE DIHYDROCHLORIDE 5 MG PO TABS: ORAL_TABLET | ORAL | Status: DC

## 2012-09-03 NOTE — Telephone Encounter (Signed)
REFILL LEVOCETIRIZINE 5MG  TABLETS #90 TK 1 T PO QD PRN --Last fill 8.22.13--last ov 3.19.13 acute

## 2012-09-23 ENCOUNTER — Ambulatory Visit (INDEPENDENT_AMBULATORY_CARE_PROVIDER_SITE_OTHER): Payer: BC Managed Care – PPO | Admitting: Family Medicine

## 2012-09-23 ENCOUNTER — Encounter: Payer: Self-pay | Admitting: Family Medicine

## 2012-09-23 VITALS — BP 120/74 | HR 72 | Temp 99.0°F | Wt 120.8 lb

## 2012-09-23 DIAGNOSIS — J329 Chronic sinusitis, unspecified: Secondary | ICD-10-CM

## 2012-09-23 MED ORDER — CLARITHROMYCIN ER 500 MG PO TB24: 1000.0000 mg | ORAL_TABLET | Freq: Every day | ORAL | Status: AC

## 2012-09-23 NOTE — Patient Instructions (Addendum)

## 2012-09-23 NOTE — Progress Notes (Signed)
  Subjective:     Sherri Hoffman is a 56 y.o. female who presents for evaluation of sinus pain. Symptoms include: congestion, cough, mouth breathing, nasal congestion, post nasal drip, sinus pressure, sore throat and ear pressure. Onset of symptoms was 2 weeks ago. Symptoms have been gradually worsening since that time. Past history is significant for no history of pneumonia or bronchitis. Patient is a non-smoker.  The following portions of the patient's history were reviewed and updated as appropriate: allergies, current medications, past family history, past medical history, past social history, past surgical history and problem list.  Review of Systems Pertinent items are noted in HPI.   Objective:    BP 120/74  Pulse 72  Temp 99 F (37.2 C) (Oral)  Wt 120 lb 12.8 oz (54.795 kg)  SpO2 98% General appearance: alert, cooperative, appears stated age and no distress Ears: normal TM's and external ear canals both ears Nose: Nares normal. Septum midline. Mucosa normal. No drainage or sinus tenderness., green discharge, moderate congestion, turbinates red, swollen, sinus tenderness bilateral Throat: abnormal findings: mild oropharyngeal erythema Neck: mild anterior cervical adenopathy, supple, symmetrical, trachea midline and thyroid not enlarged, symmetric, no tenderness/mass/nodules Lungs: clear to auscultation bilaterally Extremities: extremities normal, atraumatic, no cyanosis or edema    Assessment:    Acute bacterial sinusitis.    Plan:    Nasal steroids per medication orders. Antihistamines per medication orders. Biaxin per medication orders.

## 2012-11-16 ENCOUNTER — Other Ambulatory Visit: Payer: Self-pay | Admitting: Family Medicine

## 2012-11-20 ENCOUNTER — Other Ambulatory Visit: Payer: Self-pay | Admitting: Family Medicine

## 2012-11-21 ENCOUNTER — Encounter: Payer: Self-pay | Admitting: Family Medicine

## 2012-11-21 ENCOUNTER — Ambulatory Visit (INDEPENDENT_AMBULATORY_CARE_PROVIDER_SITE_OTHER): Payer: BC Managed Care – PPO | Admitting: Family Medicine

## 2012-11-21 VITALS — BP 116/70 | HR 76 | Temp 98.1°F | Ht 71.0 in | Wt 121.2 lb

## 2012-11-21 DIAGNOSIS — E876 Hypokalemia: Secondary | ICD-10-CM

## 2012-11-21 DIAGNOSIS — I10 Essential (primary) hypertension: Secondary | ICD-10-CM

## 2012-11-21 DIAGNOSIS — Z0000 Encounter for general adult medical examination without abnormal findings: Secondary | ICD-10-CM

## 2012-11-21 DIAGNOSIS — J329 Chronic sinusitis, unspecified: Secondary | ICD-10-CM

## 2012-11-21 LAB — HEPATIC FUNCTION PANEL
ALT: 15 U/L (ref 0–35)
AST: 24 U/L (ref 0–37)
Albumin: 4.3 g/dL (ref 3.5–5.2)
Alkaline Phosphatase: 35 U/L — ABNORMAL LOW (ref 39–117)
Bilirubin, Direct: 0.1 mg/dL (ref 0.0–0.3)
Total Bilirubin: 0.6 mg/dL (ref 0.3–1.2)
Total Protein: 7.6 g/dL (ref 6.0–8.3)

## 2012-11-21 LAB — CBC WITH DIFFERENTIAL/PLATELET
Basophils Relative: 0.6 % (ref 0.0–3.0)
Eosinophils Absolute: 0.1 10*3/uL (ref 0.0–0.7)
Eosinophils Relative: 2.1 % (ref 0.0–5.0)
HCT: 37.6 % (ref 36.0–46.0)
Lymphs Abs: 1.5 10*3/uL (ref 0.7–4.0)
MCHC: 33.6 g/dL (ref 30.0–36.0)
MCV: 90.3 fl (ref 78.0–100.0)
Monocytes Absolute: 0.3 10*3/uL (ref 0.1–1.0)
Neutrophils Relative %: 45.9 % (ref 43.0–77.0)
Platelets: 237 10*3/uL (ref 150.0–400.0)
WBC: 3.5 10*3/uL — ABNORMAL LOW (ref 4.5–10.5)

## 2012-11-21 LAB — BASIC METABOLIC PANEL WITH GFR
BUN: 23 mg/dL (ref 6–23)
CO2: 28 meq/L (ref 19–32)
Calcium: 9.2 mg/dL (ref 8.4–10.5)
Chloride: 104 meq/L (ref 96–112)
Creatinine, Ser: 0.6 mg/dL (ref 0.4–1.2)
GFR: 111.33 mL/min
Glucose, Bld: 101 mg/dL — ABNORMAL HIGH (ref 70–99)
Potassium: 3.8 meq/L (ref 3.5–5.1)
Sodium: 140 meq/L (ref 135–145)

## 2012-11-21 LAB — LIPID PANEL
Cholesterol: 194 mg/dL (ref 0–200)
HDL: 76.8 mg/dL
LDL Cholesterol: 100 mg/dL — ABNORMAL HIGH (ref 0–99)
Total CHOL/HDL Ratio: 3
Triglycerides: 88 mg/dL (ref 0.0–149.0)
VLDL: 17.6 mg/dL (ref 0.0–40.0)

## 2012-11-21 LAB — MICROALBUMIN / CREATININE URINE RATIO: Microalb Creat Ratio: 0.4 mg/g (ref 0.0–30.0)

## 2012-11-21 LAB — POCT URINALYSIS DIPSTICK
Bilirubin, UA: NEGATIVE
Blood, UA: NEGATIVE
Glucose, UA: NEGATIVE
Ketones, UA: NEGATIVE
Leukocytes, UA: NEGATIVE
Nitrite, UA: NEGATIVE
Protein, UA: NEGATIVE
Spec Grav, UA: >=1.03
Urobilinogen, UA: 0.2
pH, UA: 5

## 2012-11-21 LAB — TSH: TSH: 2.56 u[IU]/mL (ref 0.35–5.50)

## 2012-11-21 MED ORDER — VALSARTAN-HYDROCHLOROTHIAZIDE 160-12.5 MG PO TABS: 1.0000 | ORAL_TABLET | Freq: Every day | ORAL | Status: DC

## 2012-11-21 MED ORDER — POTASSIUM CHLORIDE ER 10 MEQ PO TBCR: 10.0000 meq | EXTENDED_RELEASE_TABLET | Freq: Two times a day (BID) | ORAL | Status: DC

## 2012-11-21 MED ORDER — MOXIFLOXACIN HCL 400 MG PO TABS: 400.0000 mg | ORAL_TABLET | Freq: Every day | ORAL | Status: DC

## 2012-11-21 NOTE — Assessment & Plan Note (Signed)
Stable con't meds 

## 2012-11-21 NOTE — Patient Instructions (Signed)
Preventive Care for Adults, Female A healthy lifestyle and preventive care can promote health and wellness. Preventive health guidelines for women include the following key practices.  A routine yearly physical is a good way to check with your caregiver about your health and preventive screening. It is a chance to share any concerns and updates on your health, and to receive a thorough exam.  Visit your dentist for a routine exam and preventive care every 6 months. Brush your teeth twice a day and floss once a day. Good oral hygiene prevents tooth decay and gum disease.  The frequency of eye exams is based on your age, health, family medical history, use of contact lenses, and other factors. Follow your caregiver's recommendations for frequency of eye exams.  Eat a healthy diet. Foods like vegetables, fruits, whole grains, low-fat dairy products, and lean protein foods contain the nutrients you need without too many calories. Decrease your intake of foods high in solid fats, added sugars, and salt. Eat the right amount of calories for you.Get information about a proper diet from your caregiver, if necessary.  Regular physical exercise is one of the most important things you can do for your health. Most adults should get at least 150 minutes of moderate-intensity exercise (any activity that increases your heart rate and causes you to sweat) each week. In addition, most adults need muscle-strengthening exercises on 2 or more days a week.  Maintain a healthy weight. The body mass index (BMI) is a screening tool to identify possible weight problems. It provides an estimate of body fat based on height and weight. Your caregiver can help determine your BMI, and can help you achieve or maintain a healthy weight.For adults 20 years and older:  A BMI below 18.5 is considered underweight.  A BMI of 18.5 to 24.9 is normal.  A BMI of 25 to 29.9 is considered overweight.  A BMI of 30 and above is  considered obese.  Maintain normal blood lipids and cholesterol levels by exercising and minimizing your intake of saturated fat. Eat a balanced diet with plenty of fruit and vegetables. Blood tests for lipids and cholesterol should begin at age 20 and be repeated every 5 years. If your lipid or cholesterol levels are high, you are over 50, or you are at high risk for heart disease, you may need your cholesterol levels checked more frequently.Ongoing high lipid and cholesterol levels should be treated with medicines if diet and exercise are not effective.  If you smoke, find out from your caregiver how to quit. If you do not use tobacco, do not start.  If you are pregnant, do not drink alcohol. If you are breastfeeding, be very cautious about drinking alcohol. If you are not pregnant and choose to drink alcohol, do not exceed 1 drink per day. One drink is considered to be 12 ounces (355 mL) of beer, 5 ounces (148 mL) of wine, or 1.5 ounces (44 mL) of liquor.  Avoid use of street drugs. Do not share needles with anyone. Ask for help if you need support or instructions about stopping the use of drugs.  High blood pressure causes heart disease and increases the risk of stroke. Your blood pressure should be checked at least every 1 to 2 years. Ongoing high blood pressure should be treated with medicines if weight loss and exercise are not effective.  If you are 55 to 58 years old, ask your caregiver if you should take aspirin to prevent strokes.  Diabetes   screening involves taking a blood sample to check your fasting blood sugar level. This should be done once every 3 years, after age 45, if you are within normal weight and without risk factors for diabetes. Testing should be considered at a younger age or be carried out more frequently if you are overweight and have at least 1 risk factor for diabetes.  Breast cancer screening is essential preventive care for women. You should practice "breast  self-awareness." This means understanding the normal appearance and feel of your breasts and may include breast self-examination. Any changes detected, no matter how small, should be reported to a caregiver. Women in their 20s and 30s should have a clinical breast exam (CBE) by a caregiver as part of a regular health exam every 1 to 3 years. After age 40, women should have a CBE every year. Starting at age 40, women should consider having a mammography (breast X-ray test) every year. Women who have a family history of breast cancer should talk to their caregiver about genetic screening. Women at a high risk of breast cancer should talk to their caregivers about having magnetic resonance imaging (MRI) and a mammography every year.  The Pap test is a screening test for cervical cancer. A Pap test can show cell changes on the cervix that might become cervical cancer if left untreated. A Pap test is a procedure in which cells are obtained and examined from the lower end of the uterus (cervix).  Women should have a Pap test starting at age 21.  Between ages 21 and 29, Pap tests should be repeated every 2 years.  Beginning at age 30, you should have a Pap test every 3 years as long as the past 3 Pap tests have been normal.  Some women have medical problems that increase the chance of getting cervical cancer. Talk to your caregiver about these problems. It is especially important to talk to your caregiver if a new problem develops soon after your last Pap test. In these cases, your caregiver may recommend more frequent screening and Pap tests.  The above recommendations are the same for women who have or have not gotten the vaccine for human papillomavirus (HPV).  If you had a hysterectomy for a problem that was not cancer or a condition that could lead to cancer, then you no longer need Pap tests. Even if you no longer need a Pap test, a regular exam is a good idea to make sure no other problems are  starting.  If you are between ages 65 and 70, and you have had normal Pap tests going back 10 years, you no longer need Pap tests. Even if you no longer need a Pap test, a regular exam is a good idea to make sure no other problems are starting.  If you have had past treatment for cervical cancer or a condition that could lead to cancer, you need Pap tests and screening for cancer for at least 20 years after your treatment.  If Pap tests have been discontinued, risk factors (such as a new sexual partner) need to be reassessed to determine if screening should be resumed.  The HPV test is an additional test that may be used for cervical cancer screening. The HPV test looks for the virus that can cause the cell changes on the cervix. The cells collected during the Pap test can be tested for HPV. The HPV test could be used to screen women aged 30 years and older, and should   be used in women of any age who have unclear Pap test results. After the age of 30, women should have HPV testing at the same frequency as a Pap test.  Colorectal cancer can be detected and often prevented. Most routine colorectal cancer screening begins at the age of 50 and continues through age 75. However, your caregiver may recommend screening at an earlier age if you have risk factors for colon cancer. On a yearly basis, your caregiver may provide home test kits to check for hidden blood in the stool. Use of a small camera at the end of a tube, to directly examine the colon (sigmoidoscopy or colonoscopy), can detect the earliest forms of colorectal cancer. Talk to your caregiver about this at age 50, when routine screening begins. Direct examination of the colon should be repeated every 5 to 10 years through age 75, unless early forms of pre-cancerous polyps or small growths are found.  Hepatitis C blood testing is recommended for all people born from 1945 through 1965 and any individual with known risks for hepatitis C.  Practice  safe sex. Use condoms and avoid high-risk sexual practices to reduce the spread of sexually transmitted infections (STIs). STIs include gonorrhea, chlamydia, syphilis, trichomonas, herpes, HPV, and human immunodeficiency virus (HIV). Herpes, HIV, and HPV are viral illnesses that have no cure. They can result in disability, cancer, and death. Sexually active women aged 25 and younger should be checked for chlamydia. Older women with new or multiple partners should also be tested for chlamydia. Testing for other STIs is recommended if you are sexually active and at increased risk.  Osteoporosis is a disease in which the bones lose minerals and strength with aging. This can result in serious bone fractures. The risk of osteoporosis can be identified using a bone density scan. Women ages 65 and over and women at risk for fractures or osteoporosis should discuss screening with their caregivers. Ask your caregiver whether you should take a calcium supplement or vitamin D to reduce the rate of osteoporosis.  Menopause can be associated with physical symptoms and risks. Hormone replacement therapy is available to decrease symptoms and risks. You should talk to your caregiver about whether hormone replacement therapy is right for you.  Use sunscreen with sun protection factor (SPF) of 30 or more. Apply sunscreen liberally and repeatedly throughout the day. You should seek shade when your shadow is shorter than you. Protect yourself by wearing long sleeves, pants, a wide-brimmed hat, and sunglasses year round, whenever you are outdoors.  Once a month, do a whole body skin exam, using a mirror to look at the skin on your back. Notify your caregiver of new moles, moles that have irregular borders, moles that are larger than a pencil eraser, or moles that have changed in shape or color.  Stay current with required immunizations.  Influenza. You need a dose every fall (or winter). The composition of the flu vaccine  changes each year, so being vaccinated once is not enough.  Pneumococcal polysaccharide. You need 1 to 2 doses if you smoke cigarettes or if you have certain chronic medical conditions. You need 1 dose at age 65 (or older) if you have never been vaccinated.  Tetanus, diphtheria, pertussis (Tdap, Td). Get 1 dose of Tdap vaccine if you are younger than age 65, are over 65 and have contact with an infant, are a healthcare worker, are pregnant, or simply want to be protected from whooping cough. After that, you need a Td   booster dose every 10 years. Consult your caregiver if you have not had at least 3 tetanus and diphtheria-containing shots sometime in your life or have a deep or dirty wound.  HPV. You need this vaccine if you are a woman age 26 or younger. The vaccine is given in 3 doses over 6 months.  Measles, mumps, rubella (MMR). You need at least 1 dose of MMR if you were born in 1957 or later. You may also need a second dose.  Meningococcal. If you are age 19 to 21 and a first-year college student living in a residence hall, or have one of several medical conditions, you need to get vaccinated against meningococcal disease. You may also need additional booster doses.  Zoster (shingles). If you are age 60 or older, you should get this vaccine.  Varicella (chickenpox). If you have never had chickenpox or you were vaccinated but received only 1 dose, talk to your caregiver to find out if you need this vaccine.  Hepatitis A. You need this vaccine if you have a specific risk factor for hepatitis A virus infection or you simply wish to be protected from this disease. The vaccine is usually given as 2 doses, 6 to 18 months apart.  Hepatitis B. You need this vaccine if you have a specific risk factor for hepatitis B virus infection or you simply wish to be protected from this disease. The vaccine is given in 3 doses, usually over 6 months. Preventive Services / Frequency Ages 19 to 39  Blood  pressure check.** / Every 1 to 2 years.  Lipid and cholesterol check.** / Every 5 years beginning at age 20.  Clinical breast exam.** / Every 3 years for women in their 20s and 30s.  Pap test.** / Every 2 years from ages 21 through 29. Every 3 years starting at age 30 through age 65 or 70 with a history of 3 consecutive normal Pap tests.  HPV screening.** / Every 3 years from ages 30 through ages 65 to 70 with a history of 3 consecutive normal Pap tests.  Hepatitis C blood test.** / For any individual with known risks for hepatitis C.  Skin self-exam. / Monthly.  Influenza immunization.** / Every year.  Pneumococcal polysaccharide immunization.** / 1 to 2 doses if you smoke cigarettes or if you have certain chronic medical conditions.  Tetanus, diphtheria, pertussis (Tdap, Td) immunization. / A one-time dose of Tdap vaccine. After that, you need a Td booster dose every 10 years.  HPV immunization. / 3 doses over 6 months, if you are 26 and younger.  Measles, mumps, rubella (MMR) immunization. / You need at least 1 dose of MMR if you were born in 1957 or later. You may also need a second dose.  Meningococcal immunization. / 1 dose if you are age 19 to 21 and a first-year college student living in a residence hall, or have one of several medical conditions, you need to get vaccinated against meningococcal disease. You may also need additional booster doses.  Varicella immunization.** / Consult your caregiver.  Hepatitis A immunization.** / Consult your caregiver. 2 doses, 6 to 18 months apart.  Hepatitis B immunization.** / Consult your caregiver. 3 doses usually over 6 months. Ages 40 to 64  Blood pressure check.** / Every 1 to 2 years.  Lipid and cholesterol check.** / Every 5 years beginning at age 20.  Clinical breast exam.** / Every year after age 40.  Mammogram.** / Every year beginning at age 40   and continuing for as long as you are in good health. Consult with your  caregiver.  Pap test.** / Every 3 years starting at age 30 through age 65 or 70 with a history of 3 consecutive normal Pap tests.  HPV screening.** / Every 3 years from ages 30 through ages 65 to 70 with a history of 3 consecutive normal Pap tests.  Fecal occult blood test (FOBT) of stool. / Every year beginning at age 50 and continuing until age 75. You may not need to do this test if you get a colonoscopy every 10 years.  Flexible sigmoidoscopy or colonoscopy.** / Every 5 years for a flexible sigmoidoscopy or every 10 years for a colonoscopy beginning at age 50 and continuing until age 75.  Hepatitis C blood test.** / For all people born from 1945 through 1965 and any individual with known risks for hepatitis C.  Skin self-exam. / Monthly.  Influenza immunization.** / Every year.  Pneumococcal polysaccharide immunization.** / 1 to 2 doses if you smoke cigarettes or if you have certain chronic medical conditions.  Tetanus, diphtheria, pertussis (Tdap, Td) immunization.** / A one-time dose of Tdap vaccine. After that, you need a Td booster dose every 10 years.  Measles, mumps, rubella (MMR) immunization. / You need at least 1 dose of MMR if you were born in 1957 or later. You may also need a second dose.  Varicella immunization.** / Consult your caregiver.  Meningococcal immunization.** / Consult your caregiver.  Hepatitis A immunization.** / Consult your caregiver. 2 doses, 6 to 18 months apart.  Hepatitis B immunization.** / Consult your caregiver. 3 doses, usually over 6 months. Ages 65 and over  Blood pressure check.** / Every 1 to 2 years.  Lipid and cholesterol check.** / Every 5 years beginning at age 20.  Clinical breast exam.** / Every year after age 40.  Mammogram.** / Every year beginning at age 40 and continuing for as long as you are in good health. Consult with your caregiver.  Pap test.** / Every 3 years starting at age 30 through age 65 or 70 with a 3  consecutive normal Pap tests. Testing can be stopped between 65 and 70 with 3 consecutive normal Pap tests and no abnormal Pap or HPV tests in the past 10 years.  HPV screening.** / Every 3 years from ages 30 through ages 65 or 70 with a history of 3 consecutive normal Pap tests. Testing can be stopped between 65 and 70 with 3 consecutive normal Pap tests and no abnormal Pap or HPV tests in the past 10 years.  Fecal occult blood test (FOBT) of stool. / Every year beginning at age 50 and continuing until age 75. You may not need to do this test if you get a colonoscopy every 10 years.  Flexible sigmoidoscopy or colonoscopy.** / Every 5 years for a flexible sigmoidoscopy or every 10 years for a colonoscopy beginning at age 50 and continuing until age 75.  Hepatitis C blood test.** / For all people born from 1945 through 1965 and any individual with known risks for hepatitis C.  Osteoporosis screening.** / A one-time screening for women ages 65 and over and women at risk for fractures or osteoporosis.  Skin self-exam. / Monthly.  Influenza immunization.** / Every year.  Pneumococcal polysaccharide immunization.** / 1 dose at age 65 (or older) if you have never been vaccinated.  Tetanus, diphtheria, pertussis (Tdap, Td) immunization. / A one-time dose of Tdap vaccine if you are over   65 and have contact with an infant, are a healthcare worker, or simply want to be protected from whooping cough. After that, you need a Td booster dose every 10 years.  Varicella immunization.** / Consult your caregiver.  Meningococcal immunization.** / Consult your caregiver.  Hepatitis A immunization.** / Consult your caregiver. 2 doses, 6 to 18 months apart.  Hepatitis B immunization.** / Check with your caregiver. 3 doses, usually over 6 months. ** Family history and personal history of risk and conditions may change your caregiver's recommendations. Document Released: 12/25/2001 Document Revised: 01/21/2012  Document Reviewed: 03/26/2011 [employer] Patient Information 2013 [employer].  

## 2012-11-21 NOTE — Progress Notes (Signed)
Subjective:     Sherri Hoffman is a 57 y.o. female and is here for a comprehensive physical exam. The patient reports no problems.  History   Social History  . Marital Status: Married    Spouse Name: N/A    Number of Children: N/A  . Years of Education: N/A   Occupational History  . [employer]    Social History Main Topics  . Smoking status: Never Smoker   . Smokeless tobacco: Never Used  . Alcohol Use: 1.8 oz/week    3 Glasses of wine per week  . Drug Use: No  . Sexually Active: Yes -- Female partner(s)    Birth Control/ Protection: Post-menopausal   Other Topics Concern  . Not on file   Social History Narrative  . No narrative on file   Health Maintenance  Topic Date Due  . Influenza Vaccine  07/13/2013  . Mammogram  08/05/2014  . Pap Smear  07/23/2015  . Tetanus/tdap  03/17/2017  . Colonoscopy  09/26/2018    The following portions of the patient's history were reviewed and updated as appropriate:  She  has a past medical history of Hypertension and Skin cancer, basal cell. She  does not have any pertinent problems on file. She  has past surgical history that includes Myomectomy. Her family history includes COPD in an unspecified family member; Cancer in an unspecified family member; Coronary artery disease in an unspecified family member; Diabetes in an unspecified family member; Hypertension in an unspecified family member; and Skin cancer in an unspecified family member. She  reports that she has never smoked. She has never used smokeless tobacco. She reports that she drinks about 1.8 ounces of alcohol per week. She reports that she does not use illicit drugs. She has a current medication list which includes the following prescription(s): azelastine, calcium carbonate-vitamin d, diovan hct, estradiol, levocetirizine, nasonex, potassium chloride, and progesterone. Current Outpatient Prescriptions on File Prior to Visit  Medication Sig Dispense Refill  . azelastine  (ASTELIN) 137 MCG/SPRAY nasal spray Place 2 sprays into the nose at bedtime. Use in each nostril as directed  30 mL  1  . Calcium Carbonate-Vitamin D (CALCIUM 500/D) 500-125 MG-UNIT TABS Take by mouth.        . DIOVAN HCT 160-12.5 MG per tablet TAKE 1 TABLET BY MOUTH DAILY  90 tablet  1  . estradiol (ESTRACE) 1 MG tablet Take 1 tablet (1 mg total) by mouth daily.  90 tablet  3  . levocetirizine (XYZAL) 5 MG tablet 1 po qd prn  90 tablet  1  . NASONEX 50 MCG/ACT nasal spray INSTILL 2 SPRAYS IN EACH NOSTRIL EVERY DAY  17 g  0  . potassium chloride (KLOR-CON 10) 10 MEQ CR tablet 1 po qd  90 tablet  3  . progesterone (PROMETRIUM) 100 MG capsule Take 1 capsule (100 mg total) by mouth daily.  90 capsule  4  . [DISCONTINUED] triamcinolone (NASACORT AQ) 55 MCG/ACT nasal inhaler Place 2 sprays into the nose daily.  1 Inhaler  12   She is allergic to ace inhibitors and erythromycin..  Review of Systems Review of Systems  Constitutional: Negative for activity change, appetite change and fatigue.  HENT: Negative for hearing loss, congestion, tinnitus and ear discharge.  dentist q18m Eyes: Negative for visual disturbance (see optho q1y -- vision corrected to 20/20 with glasses).  Respiratory: Negative for cough, chest tightness and shortness of breath.   Cardiovascular: Negative for chest pain, palpitations and  leg swelling.  Gastrointestinal: Negative for abdominal pain, diarrhea, constipation and abdominal distention.  Genitourinary: Negative for urgency, frequency, decreased urine volume and difficulty urinating.  Musculoskeletal: Negative for back pain, arthralgias and gait problem.  Skin: Negative for color change, pallor and rash.  Neurological: Negative for dizziness, light-headedness, numbness and headaches.  Hematological: Negative for adenopathy. Does not bruise/bleed easily.  Psychiatric/Behavioral: Negative for suicidal ideas, confusion, sleep disturbance, self-injury, dysphoric mood,  decreased concentration and agitation.       Objective:    BP 116/70  Pulse 76  Temp 98.1 F (36.7 C) (Oral)  Ht 5\' 11"  (1.803 m)  Wt 121 lb 3.2 oz (54.976 kg)  BMI 16.90 kg/m2  SpO2 99% General appearance: alert, cooperative, appears stated age and no distress Head: Normocephalic, without obvious abnormality, atraumatic Eyes: conjunctivae/corneas clear. PERRL, EOM's intact. Fundi benign. Ears: normal TM's and external ear canals both ears Nose: Nares normal. Septum midline. Mucosa normal. No drainage or sinus tenderness. Throat: lips, mucosa, and tongue normal; teeth and gums normal Neck: no adenopathy, no carotid bruit, no JVD, supple, symmetrical, trachea midline and thyroid not enlarged, symmetric, no tenderness/mass/nodules Back: symmetric, no curvature. ROM normal. No CVA tenderness. Lungs: clear to auscultation bilaterally Breasts: gyn Heart: regular rate and rhythm, S1, S2 normal, no murmur, click, rub or gallop Abdomen: soft, non-tender; bowel sounds normal; no masses,  no organomegaly Pelvic: deferred---gyn Extremities: extremities normal, atraumatic, no cyanosis or edema Pulses: 2+ and symmetric Skin: Skin color, texture, turgor normal. No rashes or lesions Lymph nodes: Cervical, supraclavicular, and axillary nodes normal. Neurologic: Alert and oriented X 3, normal strength and tone. Normal symmetric reflexes. Normal coordination and gait psych-- no anxiety , no depression    Assessment:    Healthy female exam.      Plan:    ghm utd Check labs See After Visit Summary for Counseling Recommendations

## 2012-12-04 ENCOUNTER — Other Ambulatory Visit: Payer: Self-pay | Admitting: Family Medicine

## 2013-03-01 ENCOUNTER — Other Ambulatory Visit: Payer: Self-pay | Admitting: Family Medicine

## 2013-09-10 ENCOUNTER — Ambulatory Visit (INDEPENDENT_AMBULATORY_CARE_PROVIDER_SITE_OTHER): Payer: BC Managed Care – PPO | Admitting: Family Medicine

## 2013-09-10 ENCOUNTER — Encounter: Payer: Self-pay | Admitting: Family Medicine

## 2013-09-10 VITALS — BP 132/90 | HR 65 | Temp 98.2°F | Wt 118.2 lb

## 2013-09-10 DIAGNOSIS — R05 Cough: Secondary | ICD-10-CM

## 2013-09-10 DIAGNOSIS — R059 Cough, unspecified: Secondary | ICD-10-CM

## 2013-09-10 DIAGNOSIS — J329 Chronic sinusitis, unspecified: Secondary | ICD-10-CM

## 2013-09-10 MED ORDER — GUAIFENESIN-CODEINE 100-10 MG/5ML PO SYRP: ORAL_SOLUTION | ORAL | Status: DC

## 2013-09-10 MED ORDER — CEFUROXIME AXETIL 500 MG PO TABS: 500.0000 mg | ORAL_TABLET | Freq: Two times a day (BID) | ORAL | Status: AC

## 2013-09-10 NOTE — Progress Notes (Signed)
  Subjective:     Sherri Hoffman is a 57 y.o. female who presents for evaluation of sinus pain. Symptoms include: cough, facial pain, headaches, nasal congestion, purulent rhinorrhea and sinus pressure. Onset of symptoms was 1 week ago. Symptoms have been gradually worsening since that time. Past history is significant for no history of pneumonia or bronchitis. Patient is a non-smoker.  The following portions of the patient's history were reviewed and updated as appropriate: allergies, current medications, past family history, past medical history, past social history, past surgical history and problem list.  Review of Systems Pertinent items are noted in HPI.   Objective:    BP 132/90  Pulse 65  Temp(Src) 98.2 F (36.8 C) (Oral)  Wt 118 lb 3.2 oz (53.615 kg)  BMI 16.49 kg/m2  SpO2 98% General appearance: alert, cooperative, appears stated age and no distress Ears: normal TM's and external ear canals both ears Nose: green discharge, moderate congestion, turbinates red, swollen, sinus tenderness bilateral Throat: abnormal findings: mild oropharyngeal erythema and pnd Neck: no adenopathy, supple, symmetrical, trachea midline and thyroid not enlarged, symmetric, no tenderness/mass/nodules Lungs: clear to auscultation bilaterally Heart: S1, S2 normal    Assessment:    Acute bacterial sinusitis.    Plan:    Nasal steroids per medication orders. Antihistamines per medication orders. Ceftin per medication orders.

## 2013-09-10 NOTE — Patient Instructions (Signed)

## 2013-09-10 NOTE — Addendum Note (Signed)
Addended by: Lelon Perla on: 09/10/2013 03:31 PM   Modules accepted: Orders

## 2013-09-17 ENCOUNTER — Other Ambulatory Visit: Payer: Self-pay

## 2013-09-24 ENCOUNTER — Encounter: Payer: Self-pay | Admitting: *Deleted

## 2013-09-28 ENCOUNTER — Telehealth: Payer: Self-pay | Admitting: *Deleted

## 2013-09-28 MED ORDER — AZITHROMYCIN 250 MG PO TABS: ORAL_TABLET | ORAL | Status: DC

## 2013-09-28 NOTE — Telephone Encounter (Signed)
Patient states that she was seen in the office 09/10/2013 for a sinus infection. Patient states that she is flying out of town later this afternoon and will not be back until late Thursday. She would like to know if she can be prescribed something until she returns. She is complaining of facial pressure, ear pain, drainage and a cough. Please advise. SW

## 2013-09-28 NOTE — Telephone Encounter (Signed)
zpak x1  As directed Use nasal spray and xyzal

## 2013-09-28 NOTE — Telephone Encounter (Signed)
Medication filled and patient notified

## 2013-11-19 ENCOUNTER — Other Ambulatory Visit: Payer: Self-pay | Admitting: Internal Medicine

## 2013-11-19 ENCOUNTER — Other Ambulatory Visit: Payer: Self-pay | Admitting: Family Medicine

## 2013-11-22 ENCOUNTER — Other Ambulatory Visit: Payer: Self-pay | Admitting: Family Medicine

## 2013-12-23 ENCOUNTER — Other Ambulatory Visit: Payer: Self-pay | Admitting: Family Medicine

## 2014-01-11 ENCOUNTER — Telehealth: Payer: Self-pay

## 2014-01-11 ENCOUNTER — Ambulatory Visit (INDEPENDENT_AMBULATORY_CARE_PROVIDER_SITE_OTHER): Payer: BC Managed Care – PPO | Admitting: Family Medicine

## 2014-01-11 ENCOUNTER — Encounter: Payer: Self-pay | Admitting: Family Medicine

## 2014-01-11 VITALS — BP 130/84 | HR 65 | Temp 98.6°F | Wt 118.6 lb

## 2014-01-11 DIAGNOSIS — R739 Hyperglycemia, unspecified: Secondary | ICD-10-CM

## 2014-01-11 DIAGNOSIS — R7309 Other abnormal glucose: Secondary | ICD-10-CM

## 2014-01-11 DIAGNOSIS — J309 Allergic rhinitis, unspecified: Secondary | ICD-10-CM

## 2014-01-11 DIAGNOSIS — I10 Essential (primary) hypertension: Secondary | ICD-10-CM

## 2014-01-11 DIAGNOSIS — Z9109 Other allergy status, other than to drugs and biological substances: Secondary | ICD-10-CM

## 2014-01-11 DIAGNOSIS — Z0000 Encounter for general adult medical examination without abnormal findings: Secondary | ICD-10-CM

## 2014-01-11 LAB — CBC WITH DIFFERENTIAL/PLATELET
BASOS ABS: 0 10*3/uL (ref 0.0–0.1)
Basophils Relative: 0.2 % (ref 0.0–3.0)
Eosinophils Absolute: 0.1 10*3/uL (ref 0.0–0.7)
Eosinophils Relative: 1.5 % (ref 0.0–5.0)
HCT: 37.7 % (ref 36.0–46.0)
Hemoglobin: 12.5 g/dL (ref 12.0–15.0)
LYMPHS PCT: 21.7 % (ref 12.0–46.0)
Lymphs Abs: 1.5 10*3/uL (ref 0.7–4.0)
MCHC: 33.3 g/dL (ref 30.0–36.0)
MCV: 91.8 fl (ref 78.0–100.0)
Monocytes Absolute: 0.4 10*3/uL (ref 0.1–1.0)
Monocytes Relative: 6.1 % (ref 3.0–12.0)
NEUTROS ABS: 4.9 10*3/uL (ref 1.4–7.7)
NEUTROS PCT: 70.5 % (ref 43.0–77.0)
Platelets: 236 10*3/uL (ref 150.0–400.0)
RBC: 4.11 Mil/uL (ref 3.87–5.11)
RDW: 13.3 % (ref 11.5–14.6)
WBC: 6.9 10*3/uL (ref 4.5–10.5)

## 2014-01-11 LAB — BASIC METABOLIC PANEL
BUN: 15 mg/dL (ref 6–23)
CALCIUM: 9.3 mg/dL (ref 8.4–10.5)
CO2: 29 meq/L (ref 19–32)
CREATININE: 0.6 mg/dL (ref 0.4–1.2)
Chloride: 103 mEq/L (ref 96–112)
GFR: 113.1 mL/min (ref >60.00)
GLUCOSE: 87 mg/dL (ref 70–99)
Potassium: 3.6 mEq/L (ref 3.5–5.1)
Sodium: 138 mEq/L (ref 135–145)

## 2014-01-11 LAB — POCT URINALYSIS DIPSTICK
Bilirubin, UA: NEGATIVE
Blood, UA: NEGATIVE
Glucose, UA: NEGATIVE
KETONES UA: NEGATIVE
LEUKOCYTES UA: NEGATIVE
Nitrite, UA: NEGATIVE
PROTEIN UA: NEGATIVE
Spec Grav, UA: >=1.03
Urobilinogen, UA: 0.2
pH, UA: 6

## 2014-01-11 LAB — HEPATIC FUNCTION PANEL
ALBUMIN: 4.1 g/dL (ref 3.5–5.2)
ALK PHOS: 32 U/L — AB (ref 39–117)
ALT: 14 U/L (ref 0–35)
AST: 21 U/L (ref 0–37)
BILIRUBIN DIRECT: 0.1 mg/dL (ref 0.0–0.3)
BILIRUBIN TOTAL: 0.8 mg/dL (ref 0.3–1.2)
Total Protein: 7.3 g/dL (ref 6.0–8.3)

## 2014-01-11 LAB — TSH: TSH: 1.3 u[IU]/mL (ref 0.35–5.50)

## 2014-01-11 LAB — LIPID PANEL
Cholesterol: 189 mg/dL (ref 0–200)
HDL: 79.5 mg/dL (ref >39.00)
LDL Cholesterol: 80 mg/dL (ref 0–99)
Total CHOL/HDL Ratio: 2
Triglycerides: 146 mg/dL (ref 0.0–149.0)
VLDL: 29.2 mg/dL (ref 0.0–40.0)

## 2014-01-11 LAB — HEMOGLOBIN A1C: HEMOGLOBIN A1C: 5.5 % (ref 4.6–6.5)

## 2014-01-11 MED ORDER — LEVOCETIRIZINE DIHYDROCHLORIDE 5 MG PO TABS: ORAL_TABLET | ORAL | Status: DC

## 2014-01-11 MED ORDER — POTASSIUM CHLORIDE ER 10 MEQ PO TBCR: EXTENDED_RELEASE_TABLET | ORAL | Status: DC

## 2014-01-11 MED ORDER — DIOVAN HCT 160-12.5 MG PO TABS: ORAL_TABLET | ORAL | Status: DC

## 2014-01-11 NOTE — Assessment & Plan Note (Signed)
Stable con't meds 

## 2014-01-11 NOTE — Patient Instructions (Signed)

## 2014-01-11 NOTE — Progress Notes (Signed)
Subjective:     Sherri Hoffman is a 58 y.o. female and is here for a comprehensive physical exam. The patient reports no problems.  History   Social History  . Marital Status: Married    Spouse Name: N/A    Number of Children: N/A  . Years of Education: N/A   Occupational History  . [employer]    Social History Main Topics  . Smoking status: Never Smoker   . Smokeless tobacco: Never Used  . Alcohol Use: 1.8 oz/week    3 Glasses of wine per week  . Drug Use: No  . Sexual Activity: Yes    Partners: Male    Birth Control/ Protection: Post-menopausal   Other Topics Concern  . Not on file   Social History Narrative  . No narrative on file   Health Maintenance  Topic Date Due  . Influenza Vaccine  06/12/2014  . Pap Smear  07/23/2015  . Mammogram  09/10/2015  . Tetanus/tdap  03/17/2017  . Colonoscopy  09/26/2018    The following portions of the patient's history were reviewed and updated as appropriate:  She  has a past medical history of Hypertension and Skin cancer, basal cell. She  does not have any pertinent problems on file. She  has past surgical history that includes Myomectomy. Her family history includes COPD in her father, mother, and another family member; Cancer in her father and another family member; Coronary artery disease in an other family member; Depression in her brother; Diabetes in her mother; Heart disease in her father and mother; Hyperlipidemia in her mother; Skin cancer in an other family member. She  reports that she has never smoked. She has never used smokeless tobacco. She reports that she drinks about 1.8 ounces of alcohol per week. She reports that she does not use illicit drugs. She has a current medication list which includes the following prescription(s): azelastine, calcium carbonate-vitamin d, diovan hct, estradiol, levocetirizine, nasonex, potassium chloride, and progesterone. Current Outpatient Prescriptions on File Prior to Visit   Medication Sig Dispense Refill  . azelastine (ASTELIN) 137 MCG/SPRAY nasal spray INSTILL 2 SPRAYS IN EACH NOSTRIL AT BEDTIME AS DIRECTED  30 mL  0  . Calcium Carbonate-Vitamin D (CALCIUM 500/D) 500-125 MG-UNIT TABS Take by mouth.        . estradiol (ESTRACE) 1 MG tablet Take 1 tablet (1 mg total) by mouth daily.  90 tablet  3  . NASONEX 50 MCG/ACT nasal spray INSTILL 2 SPRAYS IN EACH NOSTRIL EVERY DAY  1 Inhaler  1  . progesterone (PROMETRIUM) 100 MG capsule Take 1 capsule (100 mg total) by mouth daily.  90 capsule  4  . [DISCONTINUED] triamcinolone (NASACORT AQ) 55 MCG/ACT nasal inhaler Place 2 sprays into the nose daily.  1 Inhaler  12   No current facility-administered medications on file prior to visit.   She is allergic to ace inhibitors and erythromycin..  Review of Systems Review of Systems  Constitutional: Negative for activity change, appetite change and fatigue.  HENT: Negative for hearing loss, congestion, tinnitus and ear discharge.  dentist q33m Eyes: Negative for visual disturbance (see optho q1y -- vision corrected to 20/20 with glasses).  Respiratory: Negative for cough, chest tightness and shortness of breath.   Cardiovascular: Negative for chest pain, palpitations and leg swelling.  Gastrointestinal: Negative for abdominal pain, diarrhea, constipation and abdominal distention.  Genitourinary: Negative for urgency, frequency, decreased urine volume and difficulty urinating.  Musculoskeletal: Negative for back pain, arthralgias and  gait problem.  Skin: Negative for color change, pallor and rash.  Neurological: Negative for dizziness, light-headedness, numbness and headaches.  Hematological: Negative for adenopathy. Does not bruise/bleed easily.  Psychiatric/Behavioral: Negative for suicidal ideas, confusion, sleep disturbance, self-injury, dysphoric mood, decreased concentration and agitation.       Objective:    BP 130/84  Pulse 65  Temp(Src) 98.6 F (37 C)  (Oral)  Wt 118 lb 9.6 oz (53.797 kg)  SpO2 98%  PF 65 L/min General appearance: alert, cooperative, appears stated age and no distress Head: Normocephalic, without obvious abnormality, atraumatic Eyes: conjunctivae/corneas clear. PERRL, EOM's intact. Fundi benign. Ears: normal TM's and external ear canals both ears Nose: Nares normal. Septum midline. Mucosa normal. No drainage or sinus tenderness. Throat: lips, mucosa, and tongue normal; teeth and gums normal Neck: no adenopathy, no carotid bruit, no JVD, supple, symmetrical, trachea midline and thyroid not enlarged, symmetric, no tenderness/mass/nodules Back: symmetric, no curvature. ROM normal. No CVA tenderness. Lungs: clear to auscultation bilaterally Breasts: gyn Heart: regular rate and rhythm, S1, S2 normal, no murmur, click, rub or gallop Abdomen: soft, non-tender; bowel sounds normal; no masses,  no organomegaly Pelvic: deferred--gyn Extremities: extremities normal, atraumatic, no cyanosis or edema Pulses: 2+ and symmetric Skin: Skin color, texture, turgor normal. No rashes or lesions Lymph nodes: Cervical, supraclavicular, and axillary nodes normal. Neurologic: Alert and oriented X 3, normal strength and tone. Normal symmetric reflexes. Normal coordination and gait Psych-- no depression, no anxiety       Assessment:    Healthy female exam.      Plan:    ghm utd Check labs See After Visit Summary for Counseling Recommendations

## 2014-01-11 NOTE — Progress Notes (Signed)
Pre visit review using our clinic review tool, if applicable. No additional management support is needed unless otherwise documented below in the visit note. 

## 2014-01-11 NOTE — Assessment & Plan Note (Signed)
Refill xyzal and nasal spray

## 2014-01-11 NOTE — Telephone Encounter (Signed)
Left message for call back. Identifiable.    Pap- 07/22/12- negative [facility]- 09/26/08- [facility]-diverticulosis [facility]- 09/09/13-negative Flu- Td-03/18/07

## 2014-01-12 ENCOUNTER — Telehealth: Payer: Self-pay | Admitting: Family Medicine

## 2014-01-12 NOTE — Telephone Encounter (Signed)
Relevant patient education assigned to patient using Emmi. ° °

## 2014-01-13 ENCOUNTER — Encounter: Payer: Self-pay | Admitting: *Deleted

## 2014-01-13 NOTE — Progress Notes (Signed)
Letter mailed with lab results

## 2014-01-13 NOTE — Telephone Encounter (Signed)
Unable to reach pre visit.  

## 2014-09-06 ENCOUNTER — Other Ambulatory Visit: Payer: Self-pay

## 2014-09-06 DIAGNOSIS — I10 Essential (primary) hypertension: Secondary | ICD-10-CM

## 2014-09-06 MED ORDER — DIOVAN HCT 160-12.5 MG PO TABS: ORAL_TABLET | ORAL | Status: DC

## 2015-01-21 ENCOUNTER — Other Ambulatory Visit: Payer: Self-pay | Admitting: Family Medicine

## 2015-03-01 ENCOUNTER — Other Ambulatory Visit: Payer: Self-pay | Admitting: Family Medicine

## 2015-03-02 ENCOUNTER — Other Ambulatory Visit: Payer: Self-pay | Admitting: Family Medicine

## 2015-03-17 ENCOUNTER — Encounter: Payer: Self-pay | Admitting: Family Medicine

## 2015-03-28 ENCOUNTER — Telehealth: Payer: Self-pay | Admitting: Family Medicine

## 2015-03-28 NOTE — Telephone Encounter (Signed)
Pre Visit letter sent  °

## 2015-04-14 ENCOUNTER — Telehealth: Payer: Self-pay | Admitting: *Deleted

## 2015-04-14 NOTE — Telephone Encounter (Signed)
Unable to reach patient at time of Pre-Visit Call.  Left message for patient to return call when available.    

## 2015-04-18 ENCOUNTER — Encounter: Payer: Self-pay | Admitting: Family Medicine

## 2015-04-18 ENCOUNTER — Ambulatory Visit (INDEPENDENT_AMBULATORY_CARE_PROVIDER_SITE_OTHER): Payer: BLUE CROSS/BLUE SHIELD | Admitting: Family Medicine

## 2015-04-18 VITALS — BP 122/84 | HR 69 | Temp 97.7°F | Ht 71.0 in | Wt 118.4 lb

## 2015-04-18 DIAGNOSIS — E876 Hypokalemia: Secondary | ICD-10-CM | POA: Diagnosis not present

## 2015-04-18 DIAGNOSIS — Z23 Encounter for immunization: Secondary | ICD-10-CM | POA: Diagnosis not present

## 2015-04-18 DIAGNOSIS — I10 Essential (primary) hypertension: Secondary | ICD-10-CM

## 2015-04-18 DIAGNOSIS — Z0000 Encounter for general adult medical examination without abnormal findings: Secondary | ICD-10-CM

## 2015-04-18 LAB — CBC WITH DIFFERENTIAL/PLATELET
BASOS ABS: 0 10*3/uL (ref 0.0–0.1)
Basophils Relative: 0.3 % (ref 0.0–3.0)
EOS ABS: 0.1 10*3/uL (ref 0.0–0.7)
Eosinophils Relative: 2.7 % (ref 0.0–5.0)
HEMATOCRIT: 36.9 % (ref 36.0–46.0)
Hemoglobin: 12.4 g/dL (ref 12.0–15.0)
LYMPHS PCT: 32.5 % (ref 12.0–46.0)
Lymphs Abs: 1.7 10*3/uL (ref 0.7–4.0)
MCHC: 33.6 g/dL (ref 30.0–36.0)
MCV: 90.4 fl (ref 78.0–100.0)
MONO ABS: 0.4 10*3/uL (ref 0.1–1.0)
Monocytes Relative: 7.3 % (ref 3.0–12.0)
Neutro Abs: 3 10*3/uL (ref 1.4–7.7)
Neutrophils Relative %: 57.2 % (ref 43.0–77.0)
PLATELETS: 303 10*3/uL (ref 150.0–400.0)
RBC: 4.09 Mil/uL (ref 3.87–5.11)
RDW: 12.7 % (ref 11.5–15.5)
WBC: 5.2 10*3/uL (ref 4.0–10.5)

## 2015-04-18 LAB — POCT URINALYSIS DIPSTICK
Bilirubin, UA: NEGATIVE
Glucose, UA: NEGATIVE
Ketones, UA: NEGATIVE
LEUKOCYTES UA: NEGATIVE
Nitrite, UA: NEGATIVE
RBC UA: NEGATIVE
Spec Grav, UA: 1.025
Urobilinogen, UA: 0.2
pH, UA: 6

## 2015-04-18 LAB — BASIC METABOLIC PANEL
BUN: 21 mg/dL (ref 6–23)
CALCIUM: 9 mg/dL (ref 8.4–10.5)
CHLORIDE: 102 meq/L (ref 96–112)
CO2: 28 meq/L (ref 19–32)
Creatinine, Ser: 0.55 mg/dL (ref 0.40–1.20)
GFR: 119.73 mL/min (ref >60.00)
Glucose, Bld: 98 mg/dL (ref 70–99)
Potassium: 4 mEq/L (ref 3.5–5.1)
SODIUM: 136 meq/L (ref 135–145)

## 2015-04-18 LAB — MICROALBUMIN / CREATININE URINE RATIO
Creatinine,U: 137.8 mg/dL
MICROALB UR: 0.9 mg/dL (ref 0.0–1.9)
MICROALB/CREAT RATIO: 0.7 mg/g (ref 0.0–30.0)

## 2015-04-18 LAB — HEPATIC FUNCTION PANEL
ALBUMIN: 4.1 g/dL (ref 3.5–5.2)
ALT: 10 U/L (ref 0–35)
AST: 15 U/L (ref 0–37)
Alkaline Phosphatase: 41 U/L (ref 39–117)
BILIRUBIN DIRECT: 0.1 mg/dL (ref 0.0–0.3)
BILIRUBIN TOTAL: 0.3 mg/dL (ref 0.2–1.2)
TOTAL PROTEIN: 7.3 g/dL (ref 6.0–8.3)

## 2015-04-18 LAB — LIPID PANEL
CHOLESTEROL: 189 mg/dL (ref 0–200)
HDL: 65.2 mg/dL (ref >39.00)
LDL Cholesterol: 102 mg/dL — ABNORMAL HIGH (ref 0–99)
NonHDL: 123.8
TRIGLYCERIDES: 110 mg/dL (ref 0.0–149.0)
Total CHOL/HDL Ratio: 3
VLDL: 22 mg/dL (ref 0.0–40.0)

## 2015-04-18 LAB — TSH: TSH: 2.06 u[IU]/mL (ref 0.35–4.50)

## 2015-04-18 MED ORDER — POTASSIUM CHLORIDE ER 10 MEQ PO TBCR: 10.0000 meq | EXTENDED_RELEASE_TABLET | Freq: Every day | ORAL | Status: DC

## 2015-04-18 MED ORDER — DIOVAN HCT 160-12.5 MG PO TABS: 1.0000 | ORAL_TABLET | Freq: Every day | ORAL | Status: DC

## 2015-04-18 NOTE — Addendum Note (Signed)
Addended by: Kathlen Brunswick on: 04/18/2015 09:44 AM   Modules accepted: Orders

## 2015-04-18 NOTE — Progress Notes (Signed)
Subjective:     Sherri Hoffman is a 59 y.o. female and is here for a comprehensive physical exam. The patient reports no problems.  History   Social History  . Marital Status: Married    Spouse Name: N/A  . Number of Children: N/A  . Years of Education: N/A   Occupational History  . [employer]    Social History Main Topics  . Smoking status: Never Smoker   . Smokeless tobacco: Never Used  . Alcohol Use: 1.8 oz/week    3 Glasses of wine per week  . Drug Use: No  . Sexual Activity:    Partners: Male    Birth Control/ Protection: Post-menopausal   Other Topics Concern  . Not on file   Social History Narrative   Health Maintenance  Topic Date Due  . ZOSTAVAX  04/17/2016 (Originally 01/14/2015)  . HIV Screening  04/17/2016 (Originally 01/13/1971)  . INFLUENZA VACCINE  06/13/2015  . PAP SMEAR  07/23/2015  . MAMMOGRAM  09/10/2015  . TETANUS/TDAP  03/17/2017  . COLONOSCOPY  09/26/2018    The following portions of the patient's history were reviewed and updated as appropriate:  She  has a past medical history of Hypertension and Skin cancer, basal cell. She  does not have any pertinent problems on file. She  has past surgical history that includes Myomectomy. Her family history includes COPD in her father, mother, and another family member; Cancer in her father and another family member; Coronary artery disease in an other family member; Depression in her brother; Diabetes in her mother; Heart disease in her father and mother; Hyperlipidemia in her mother; Skin cancer in an other family member. She  reports that she has never smoked. She has never used smokeless tobacco. She reports that she drinks about 1.8 oz of alcohol per week. She reports that she does not use illicit drugs. She has a current medication list which includes the following prescription(s): azelastine, calcium carbonate-vitamin d, diovan hct, estradiol, levocetirizine, nasonex, potassium chloride, and  progesterone. Current Outpatient Prescriptions on File Prior to Visit  Medication Sig Dispense Refill  . azelastine (ASTELIN) 137 MCG/SPRAY nasal spray INSTILL 2 SPRAYS IN EACH NOSTRIL AT BEDTIME AS DIRECTED 30 mL 0  . Calcium Carbonate-Vitamin D (CALCIUM 500/D) 500-125 MG-UNIT TABS Take by mouth.      . estradiol (ESTRACE) 1 MG tablet Take 1 tablet (1 mg total) by mouth daily. 90 tablet 3  . levocetirizine (XYZAL) 5 MG tablet TAKE 1 TABLET BY MOUTH DAILY AS NEEDED 90 tablet 3  . NASONEX 50 MCG/ACT nasal spray INSTILL 2 SPRAYS IN EACH NOSTRIL EVERY DAY 1 Inhaler 1  . progesterone (PROMETRIUM) 100 MG capsule Take 1 capsule (100 mg total) by mouth daily. 90 capsule 4  . [DISCONTINUED] triamcinolone (NASACORT AQ) 55 MCG/ACT nasal inhaler Place 2 sprays into the nose daily. 1 Inhaler 12   No current facility-administered medications on file prior to visit.   She is allergic to ace inhibitors and erythromycin..  Review of Systems Review of Systems  Constitutional: Negative for activity change, appetite change and fatigue.  HENT: Negative for hearing loss, congestion, tinnitus and ear discharge.  dentist q50m Eyes: Negative for visual disturbance (see optho q1y -- vision corrected to 20/20 with glasses).  Respiratory: Negative for cough, chest tightness and shortness of breath.   Cardiovascular: Negative for chest pain, palpitations and leg swelling.  Gastrointestinal: Negative for abdominal pain, diarrhea, constipation and abdominal distention.  Genitourinary: Negative for urgency, frequency, decreased  urine volume and difficulty urinating.  Musculoskeletal: Negative for back pain, arthralgias and gait problem.  Skin: Negative for color change, pallor and rash.  Neurological: Negative for dizziness, light-headedness, numbness and headaches.  Hematological: Negative for adenopathy. Does not bruise/bleed easily.  Psychiatric/Behavioral: Negative for suicidal ideas, confusion, sleep disturbance,  self-injury, dysphoric mood, decreased concentration and agitation.       Objective:    BP 122/84 mmHg  Pulse 69  Temp(Src) 97.7 F (36.5 C) (Oral)  Ht 5\' 11"  (1.803 m)  Wt 118 lb 6.4 oz (53.706 kg)  BMI 16.52 kg/m2  SpO2 99% General appearance: alert, cooperative, appears stated age and no distress Head: Normocephalic, without obvious abnormality, atraumatic Eyes: conjunctivae/corneas clear. PERRL, EOM's intact. Fundi benign. Ears: normal TM's and external ear canals both ears Nose: Nares normal. Septum midline. Mucosa normal. No drainage or sinus tenderness. Throat: lips, mucosa, and tongue normal; teeth and gums normal Neck: no adenopathy, no carotid bruit, no JVD, supple, symmetrical, trachea midline and thyroid not enlarged, symmetric, no tenderness/mass/nodules Back: symmetric, no curvature. ROM normal. No CVA tenderness. Lungs: clear to auscultation bilaterally Breasts: gyn Heart: regular rate and rhythm, S1, S2 normal, no murmur, click, rub or gallop Abdomen: soft, non-tender; bowel sounds normal; no masses,  no organomegaly Pelvic: deferred-gyn Extremities: extremities normal, atraumatic, no cyanosis or edema Pulses: 2+ and symmetric Skin: Skin color, texture, turgor normal. No rashes or lesions Lymph nodes: Cervical, supraclavicular, and axillary nodes normal. Neurologic: Alert and oriented X 3, normal strength and tone. Normal symmetric reflexes. Normal coordination and gait Psych- no depression, no anxiety      Assessment:    Healthy female exam.      Plan:    ghm utd Check labs See After Visit Summary for Counseling Recommendations    1. Essential hypertension Stable, cont meds - DIOVAN HCT 160-12.5 MG per tablet; Take 1 tablet by mouth daily.  Dispense: 90 tablet; Refill: 3 - Basic metabolic panel - CBC with Differential/Platelet - Hepatic function panel - Lipid panel - Microalbumin / creatinine urine ratio - POCT urinalysis dipstick - TSH  2.  Hypokalemia Check labs - potassium chloride (K-DUR) 10 MEQ tablet; Take 1 tablet (10 mEq total) by mouth daily.  Dispense: 90 tablet; Refill: 3 - Basic metabolic panel - CBC with Differential/Platelet - Hepatic function panel - Lipid panel - Microalbumin / creatinine urine ratio - POCT urinalysis dipstick - TSH  3. Preventative health care See above, see AVS - Basic metabolic panel - CBC with Differential/Platelet - Hepatic function panel - Lipid panel - Microalbumin / creatinine urine ratio - POCT urinalysis dipstick - TSH

## 2015-04-18 NOTE — Patient Instructions (Addendum)
Preventive Care for Adults A healthy lifestyle and preventive care can promote health and wellness. Preventive health guidelines for women include the following key practices.  A routine yearly physical is a good way to check with your health care provider about your health and preventive screening. It is a chance to share any concerns and updates on your health and to receive a thorough exam.  Visit your dentist for a routine exam and preventive care every 6 months. Brush your teeth twice a day and floss once a day. Good oral hygiene prevents tooth decay and gum disease.  The frequency of eye exams is based on your age, health, family medical history, use of contact lenses, and other factors. Follow your health care provider's recommendations for frequency of eye exams.  Eat a healthy diet. Foods like vegetables, fruits, whole grains, low-fat dairy products, and lean protein foods contain the nutrients you need without too many calories. Decrease your intake of foods high in solid fats, added sugars, and salt. Eat the right amount of calories for you.Get information about a proper diet from your health care provider, if necessary.  Regular physical exercise is one of the most important things you can do for your health. Most adults should get at least 150 minutes of moderate-intensity exercise (any activity that increases your heart rate and causes you to sweat) each week. In addition, most adults need muscle-strengthening exercises on 2 or more days a week.  Maintain a healthy weight. The body mass index (BMI) is a screening tool to identify possible weight problems. It provides an estimate of body fat based on height and weight. Your health care provider can find your BMI and can help you achieve or maintain a healthy weight.For adults 20 years and older:  A BMI below 18.5 is considered underweight.  A BMI of 18.5 to 24.9 is normal.  A BMI of 25 to 29.9 is considered overweight.  A BMI of  30 and above is considered obese.  Maintain normal blood lipids and cholesterol levels by exercising and minimizing your intake of saturated fat. Eat a balanced diet with plenty of fruit and vegetables. Blood tests for lipids and cholesterol should begin at age 76 and be repeated every 5 years. If your lipid or cholesterol levels are high, you are over 50, or you are at high risk for heart disease, you may need your cholesterol levels checked more frequently.Ongoing high lipid and cholesterol levels should be treated with medicines if diet and exercise are not working.  If you smoke, find out from your health care provider how to quit. If you do not use tobacco, do not start.  Lung cancer screening is recommended for adults aged 22-80 years who are at high risk for developing lung cancer because of a history of smoking. A yearly low-dose CT scan of the lungs is recommended for people who have at least a 30-pack-year history of smoking and are a current smoker or have quit within the past 15 years. A pack year of smoking is smoking an average of 1 pack of cigarettes a day for 1 year (for example: 1 pack a day for 30 years or 2 packs a day for 15 years). Yearly screening should continue until the smoker has stopped smoking for at least 15 years. Yearly screening should be stopped for people who develop a health problem that would prevent them from having lung cancer treatment.  If you are pregnant, do not drink alcohol. If you are breastfeeding,  be very cautious about drinking alcohol. If you are not pregnant and choose to drink alcohol, do not have more than 1 drink per day. One drink is considered to be 12 ounces (355 mL) of beer, 5 ounces (148 mL) of wine, or 1.5 ounces (44 mL) of liquor.  Avoid use of street drugs. Do not share needles with anyone. Ask for help if you need support or instructions about stopping the use of drugs.  High blood pressure causes heart disease and increases the risk of  stroke. Your blood pressure should be checked at least every 1 to 2 years. Ongoing high blood pressure should be treated with medicines if weight loss and exercise do not work.  If you are 3-86 years old, ask your health care provider if you should take aspirin to prevent strokes.  Diabetes screening involves taking a blood sample to check your fasting blood sugar level. This should be done once every 3 years, after age 67, if you are within normal weight and without risk factors for diabetes. Testing should be considered at a younger age or be carried out more frequently if you are overweight and have at least 1 risk factor for diabetes.  Breast cancer screening is essential preventive care for women. You should practice "breast self-awareness." This means understanding the normal appearance and feel of your breasts and may include breast self-examination. Any changes detected, no matter how small, should be reported to a health care provider. Women in their 8s and 30s should have a clinical breast exam (CBE) by a health care provider as part of a regular health exam every 1 to 3 years. After age 70, women should have a CBE every year. Starting at age 25, women should consider having a mammogram (breast X-ray test) every year. Women who have a family history of breast cancer should talk to their health care provider about genetic screening. Women at a high risk of breast cancer should talk to their health care providers about having an MRI and a mammogram every year.  Breast cancer gene (BRCA)-related cancer risk assessment is recommended for women who have family members with BRCA-related cancers. BRCA-related cancers include breast, ovarian, tubal, and peritoneal cancers. Having family members with these cancers may be associated with an increased risk for harmful changes (mutations) in the breast cancer genes BRCA1 and BRCA2. Results of the assessment will determine the need for genetic counseling and  BRCA1 and BRCA2 testing.  Routine pelvic exams to screen for cancer are no longer recommended for nonpregnant women who are considered low risk for cancer of the pelvic organs (ovaries, uterus, and vagina) and who do not have symptoms. Ask your health care provider if a screening pelvic exam is right for you.  If you have had past treatment for cervical cancer or a condition that could lead to cancer, you need Pap tests and screening for cancer for at least 20 years after your treatment. If Pap tests have been discontinued, your risk factors (such as having a new sexual partner) need to be reassessed to determine if screening should be resumed. Some women have medical problems that increase the chance of getting cervical cancer. In these cases, your health care provider may recommend more frequent screening and Pap tests.  The HPV test is an additional test that may be used for cervical cancer screening. The HPV test looks for the virus that can cause the cell changes on the cervix. The cells collected during the Pap test can be  tested for HPV. The HPV test could be used to screen women aged 30 years and older, and should be used in women of any age who have unclear Pap test results. After the age of 30, women should have HPV testing at the same frequency as a Pap test.  Colorectal cancer can be detected and often prevented. Most routine colorectal cancer screening begins at the age of 50 years and continues through age 75 years. However, your health care provider may recommend screening at an earlier age if you have risk factors for colon cancer. On a yearly basis, your health care provider may provide home test kits to check for hidden blood in the stool. Use of a small camera at the end of a tube, to directly examine the colon (sigmoidoscopy or colonoscopy), can detect the earliest forms of colorectal cancer. Talk to your health care provider about this at age 50, when routine screening begins. Direct  exam of the colon should be repeated every 5-10 years through age 75 years, unless early forms of pre-cancerous polyps or small growths are found.  People who are at an increased risk for hepatitis B should be screened for this virus. You are considered at high risk for hepatitis B if:  You were born in a country where hepatitis B occurs often. Talk with your health care provider about which countries are considered high risk.  Your parents were born in a high-risk country and you have not received a shot to protect against hepatitis B (hepatitis B vaccine).  You have HIV or AIDS.  You use needles to inject street drugs.  You live with, or have sex with, someone who has hepatitis B.  You get hemodialysis treatment.  You take certain medicines for conditions like cancer, organ transplantation, and autoimmune conditions.  Hepatitis C blood testing is recommended for all people born from 1945 through 1965 and any individual with known risks for hepatitis C.  Practice safe sex. Use condoms and avoid high-risk sexual practices to reduce the spread of sexually transmitted infections (STIs). STIs include gonorrhea, chlamydia, syphilis, trichomonas, herpes, HPV, and human immunodeficiency virus (HIV). Herpes, HIV, and HPV are viral illnesses that have no cure. They can result in disability, cancer, and death.  You should be screened for sexually transmitted illnesses (STIs) including gonorrhea and chlamydia if:  You are sexually active and are younger than 24 years.  You are older than 24 years and your health care provider tells you that you are at risk for this type of infection.  Your sexual activity has changed since you were last screened and you are at an increased risk for chlamydia or gonorrhea. Ask your health care provider if you are at risk.  If you are at risk of being infected with HIV, it is recommended that you take a prescription medicine daily to prevent HIV infection. This is  called preexposure prophylaxis (PrEP). You are considered at risk if:  You are a heterosexual woman, are sexually active, and are at increased risk for HIV infection.  You take drugs by injection.  You are sexually active with a partner who has HIV.  Talk with your health care provider about whether you are at high risk of being infected with HIV. If you choose to begin PrEP, you should first be tested for HIV. You should then be tested every 3 months for as long as you are taking PrEP.  Osteoporosis is a disease in which the bones lose minerals and strength   with aging. This can result in serious bone fractures or breaks. The risk of osteoporosis can be identified using a bone density scan. Women ages 65 years and over and women at risk for fractures or osteoporosis should discuss screening with their health care providers. Ask your health care provider whether you should take a calcium supplement or vitamin D to reduce the rate of osteoporosis.  Menopause can be associated with physical symptoms and risks. Hormone replacement therapy is available to decrease symptoms and risks. You should talk to your health care provider about whether hormone replacement therapy is right for you.  Use sunscreen. Apply sunscreen liberally and repeatedly throughout the day. You should seek shade when your shadow is shorter than you. Protect yourself by wearing long sleeves, pants, a wide-brimmed hat, and sunglasses year round, whenever you are outdoors.  Once a month, do a whole body skin exam, using a mirror to look at the skin on your back. Tell your health care provider of new moles, moles that have irregular borders, moles that are larger than a pencil eraser, or moles that have changed in shape or color.  Stay current with required vaccines (immunizations).  Influenza vaccine. All adults should be immunized every year.  Tetanus, diphtheria, and acellular pertussis (Td, Tdap) vaccine. Pregnant women should  receive 1 dose of Tdap vaccine during each pregnancy. The dose should be obtained regardless of the length of time since the last dose. Immunization is preferred during the 27th-36th week of gestation. An adult who has not previously received Tdap or who does not know her vaccine status should receive 1 dose of Tdap. This initial dose should be followed by tetanus and diphtheria toxoids (Td) booster doses every 10 years. Adults with an unknown or incomplete history of completing a 3-dose immunization series with Td-containing vaccines should begin or complete a primary immunization series including a Tdap dose. Adults should receive a Td booster every 10 years.  Varicella vaccine. An adult without evidence of immunity to varicella should receive 2 doses or a second dose if she has previously received 1 dose. Pregnant females who do not have evidence of immunity should receive the first dose after pregnancy. This first dose should be obtained before leaving the health care facility. The second dose should be obtained 4-8 weeks after the first dose.  Human papillomavirus (HPV) vaccine. Females aged 13-26 years who have not received the vaccine previously should obtain the 3-dose series. The vaccine is not recommended for use in pregnant females. However, pregnancy testing is not needed before receiving a dose. If a female is found to be pregnant after receiving a dose, no treatment is needed. In that case, the remaining doses should be delayed until after the pregnancy. Immunization is recommended for any person with an immunocompromised condition through the age of 26 years if she did not get any or all doses earlier. During the 3-dose series, the second dose should be obtained 4-8 weeks after the first dose. The third dose should be obtained 24 weeks after the first dose and 16 weeks after the second dose.  Zoster vaccine. One dose is recommended for adults aged 60 years or older unless certain conditions are  present.  Measles, mumps, and rubella (MMR) vaccine. Adults born before 1957 generally are considered immune to measles and mumps. Adults born in 1957 or later should have 1 or more doses of MMR vaccine unless there is a contraindication to the vaccine or there is laboratory evidence of immunity to   each of the three diseases. A routine second dose of MMR vaccine should be obtained at least 28 days after the first dose for students attending postsecondary schools, health care workers, or international travelers. People who received inactivated measles vaccine or an unknown type of measles vaccine during 1963-1967 should receive 2 doses of MMR vaccine. People who received inactivated mumps vaccine or an unknown type of mumps vaccine before 1979 and are at high risk for mumps infection should consider immunization with 2 doses of MMR vaccine. For females of childbearing age, rubella immunity should be determined. If there is no evidence of immunity, females who are not pregnant should be vaccinated. If there is no evidence of immunity, females who are pregnant should delay immunization until after pregnancy. Unvaccinated health care workers born before [birth year] who lack laboratory evidence of measles, mumps, or rubella immunity or laboratory confirmation of disease should consider measles and mumps immunization with 2 doses of MMR vaccine or rubella immunization with 1 dose of MMR vaccine.  Pneumococcal 13-valent conjugate (PCV13) vaccine. When indicated, a person who is uncertain of her immunization history and has no record of immunization should receive the PCV13 vaccine. An adult aged 45 years or older who has certain medical conditions and has not been previously immunized should receive 1 dose of PCV13 vaccine. This PCV13 should be followed with a dose of pneumococcal polysaccharide (PPSV23) vaccine. The PPSV23 vaccine dose should be obtained at least 8 weeks after the dose of PCV13 vaccine. An adult aged 60  years or older who has certain medical conditions and previously received 1 or more doses of PPSV23 vaccine should receive 1 dose of PCV13. The PCV13 vaccine dose should be obtained 1 or more years after the last PPSV23 vaccine dose.  Pneumococcal polysaccharide (PPSV23) vaccine. When PCV13 is also indicated, PCV13 should be obtained first. All adults aged 24 years and older should be immunized. An adult younger than age 67 years who has certain medical conditions should be immunized. Any person who resides in a nursing home or long-term care facility should be immunized. An adult smoker should be immunized. People with an immunocompromised condition and certain other conditions should receive both PCV13 and PPSV23 vaccines. People with human immunodeficiency virus (HIV) infection should be immunized as soon as possible after diagnosis. Immunization during chemotherapy or radiation therapy should be avoided. Routine use of PPSV23 vaccine is not recommended for American Indians, [place] Natives, or people younger than 65 years unless there are medical conditions that require PPSV23 vaccine. When indicated, people who have unknown immunization and have no record of immunization should receive PPSV23 vaccine. One-time revaccination 5 years after the first dose of PPSV23 is recommended for people aged 19-64 years who have chronic kidney failure, nephrotic syndrome, asplenia, or immunocompromised conditions. People who received 1-2 doses of PPSV23 before age 96 years should receive another dose of PPSV23 vaccine at age 41 years or later if at least 5 years have passed since the previous dose. Doses of PPSV23 are not needed for people immunized with PPSV23 at or after age 35 years.  Meningococcal vaccine. Adults with asplenia or persistent complement component deficiencies should receive 2 doses of quadrivalent meningococcal conjugate (MenACWY-D) vaccine. The doses should be obtained at least 2 months apart.  Microbiologists working with certain meningococcal bacteria, [employer] recruits, people at risk during an outbreak, and people who travel to or live in countries with a high rate of meningitis should be immunized. A first-year college student up through age  21 years who is living in a residence hall should receive a dose if she did not receive a dose on or after her 16th birthday. Adults who have certain high-risk conditions should receive one or more doses of vaccine.  Hepatitis A vaccine. Adults who wish to be protected from this disease, have certain high-risk conditions, work with hepatitis A-infected animals, work in hepatitis A research labs, or travel to or work in countries with a high rate of hepatitis A should be immunized. Adults who were previously unvaccinated and who anticipate close contact with an international adoptee during the first 60 days after arrival in the [place] from a country with a high rate of hepatitis A should be immunized.  Hepatitis B vaccine. Adults who wish to be protected from this disease, have certain high-risk conditions, may be exposed to blood or other infectious body fluids, are household contacts or sex partners of hepatitis B positive people, are clients or workers in certain care facilities, or travel to or work in countries with a high rate of hepatitis B should be immunized.  Haemophilus influenzae type b (Hib) vaccine. A previously unvaccinated person with asplenia or sickle cell disease or having a scheduled splenectomy should receive 1 dose of Hib vaccine. Regardless of previous immunization, a recipient of a hematopoietic stem cell transplant should receive a 3-dose series 6-12 months after her successful transplant. Hib vaccine is not recommended for adults with HIV infection. Preventive Services / Frequency Ages 64 to 68 years  Blood pressure check.** / Every 1 to 2 years.  Lipid and cholesterol check.** / Every 5 years beginning at age  22.  Clinical breast exam.** / Every 3 years for women in their 88s and 53s.  BRCA-related cancer risk assessment.** / For women who have family members with a BRCA-related cancer (breast, ovarian, tubal, or peritoneal cancers).  Pap test.** / Every 2 years from ages 90 through 51. Every 3 years starting at age 21 through age 56 or 3 with a history of 3 consecutive normal Pap tests.  HPV screening.** / Every 3 years from ages 24 through ages 1 to 46 with a history of 3 consecutive normal Pap tests.  Hepatitis C blood test.** / For any individual with known risks for hepatitis C.  Skin self-exam. / Monthly.  Influenza vaccine. / Every year.  Tetanus, diphtheria, and acellular pertussis (Tdap, Td) vaccine.** / Consult your health care provider. Pregnant women should receive 1 dose of Tdap vaccine during each pregnancy. 1 dose of Td every 10 years.  Varicella vaccine.** / Consult your health care provider. Pregnant females who do not have evidence of immunity should receive the first dose after pregnancy.  HPV vaccine. / 3 doses over 6 months, if 72 and younger. The vaccine is not recommended for use in pregnant females. However, pregnancy testing is not needed before receiving a dose.  Measles, mumps, rubella (MMR) vaccine.** / You need at least 1 dose of MMR if you were born in 1957 or later. You may also need a 2nd dose. For females of childbearing age, rubella immunity should be determined. If there is no evidence of immunity, females who are not pregnant should be vaccinated. If there is no evidence of immunity, females who are pregnant should delay immunization until after pregnancy.  Pneumococcal 13-valent conjugate (PCV13) vaccine.** / Consult your health care provider.  Pneumococcal polysaccharide (PPSV23) vaccine.** / 1 to 2 doses if you smoke cigarettes or if you have certain conditions.  Meningococcal vaccine.** /  1 dose if you are age 19 to 21 years and a first-year college  student living in a residence hall, or have one of several medical conditions, you need to get vaccinated against meningococcal disease. You may also need additional booster doses.  Hepatitis A vaccine.** / Consult your health care provider.  Hepatitis B vaccine.** / Consult your health care provider.  Haemophilus influenzae type b (Hib) vaccine.** / Consult your health care provider. Ages 40 to 64 years  Blood pressure check.** / Every 1 to 2 years.  Lipid and cholesterol check.** / Every 5 years beginning at age 20 years.  Lung cancer screening. / Every year if you are aged 55-80 years and have a 30-pack-year history of smoking and currently smoke or have quit within the past 15 years. Yearly screening is stopped once you have quit smoking for at least 15 years or develop a health problem that would prevent you from having lung cancer treatment.  Clinical breast exam.** / Every year after age 40 years.  BRCA-related cancer risk assessment.** / For women who have family members with a BRCA-related cancer (breast, ovarian, tubal, or peritoneal cancers).  Mammogram.** / Every year beginning at age 40 years and continuing for as long as you are in good health. Consult with your health care provider.  Pap test.** / Every 3 years starting at age 30 years through age 65 or 70 years with a history of 3 consecutive normal Pap tests.  HPV screening.** / Every 3 years from ages 30 years through ages 65 to 70 years with a history of 3 consecutive normal Pap tests.  Fecal occult blood test (FOBT) of stool. / Every year beginning at age 50 years and continuing until age 75 years. You may not need to do this test if you get a colonoscopy every 10 years.  Flexible sigmoidoscopy or colonoscopy.** / Every 5 years for a flexible sigmoidoscopy or every 10 years for a colonoscopy beginning at age 50 years and continuing until age 75 years.  Hepatitis C blood test.** / For all people born from 1945 through  1965 and any individual with known risks for hepatitis C.  Skin self-exam. / Monthly.  Influenza vaccine. / Every year.  Tetanus, diphtheria, and acellular pertussis (Tdap/Td) vaccine.** / Consult your health care provider. Pregnant women should receive 1 dose of Tdap vaccine during each pregnancy. 1 dose of Td every 10 years.  Varicella vaccine.** / Consult your health care provider. Pregnant females who do not have evidence of immunity should receive the first dose after pregnancy.  Zoster vaccine.** / 1 dose for adults aged 60 years or older.  Measles, mumps, rubella (MMR) vaccine.** / You need at least 1 dose of MMR if you were born in 1957 or later. You may also need a 2nd dose. For females of childbearing age, rubella immunity should be determined. If there is no evidence of immunity, females who are not pregnant should be vaccinated. If there is no evidence of immunity, females who are pregnant should delay immunization until after pregnancy.  Pneumococcal 13-valent conjugate (PCV13) vaccine.** / Consult your health care provider.  Pneumococcal polysaccharide (PPSV23) vaccine.** / 1 to 2 doses if you smoke cigarettes or if you have certain conditions.  Meningococcal vaccine.** / Consult your health care provider.  Hepatitis A vaccine.** / Consult your health care provider.  Hepatitis B vaccine.** / Consult your health care provider.  Haemophilus influenzae type b (Hib) vaccine.** / Consult your health care provider. Ages 65   years and over  Blood pressure check.** / Every 1 to 2 years.  Lipid and cholesterol check.** / Every 5 years beginning at age 22 years.  Lung cancer screening. / Every year if you are aged 38-80 years and have a 30-pack-year history of smoking and currently smoke or have quit within the past 15 years. Yearly screening is stopped once you have quit smoking for at least 15 years or develop a health problem that would prevent you from having lung cancer  treatment.  Clinical breast exam.** / Every year after age 26 years.  BRCA-related cancer risk assessment.** / For women who have family members with a BRCA-related cancer (breast, ovarian, tubal, or peritoneal cancers).  Mammogram.** / Every year beginning at age 35 years and continuing for as long as you are in good health. Consult with your health care provider.  Pap test.** / Every 3 years starting at age 79 years through age 47 or 56 years with 3 consecutive normal Pap tests. Testing can be stopped between 65 and 70 years with 3 consecutive normal Pap tests and no abnormal Pap or HPV tests in the past 10 years.  HPV screening.** / Every 3 years from ages 59 years through ages 35 or 49 years with a history of 3 consecutive normal Pap tests. Testing can be stopped between 65 and 70 years with 3 consecutive normal Pap tests and no abnormal Pap or HPV tests in the past 10 years.  Fecal occult blood test (FOBT) of stool. / Every year beginning at age 22 years and continuing until age 35 years. You may not need to do this test if you get a colonoscopy every 10 years.  Flexible sigmoidoscopy or colonoscopy.** / Every 5 years for a flexible sigmoidoscopy or every 10 years for a colonoscopy beginning at age 58 years and continuing until age 36 years.  Hepatitis C blood test.** / For all people born from [birth year] through 1966 and any individual with known risks for hepatitis C.  Osteoporosis screening.** / A one-time screening for women ages 12 years and over and women at risk for fractures or osteoporosis.  Skin self-exam. / Monthly.  Influenza vaccine. / Every year.  Tetanus, diphtheria, and acellular pertussis (Tdap/Td) vaccine.** / 1 dose of Td every 10 years.  Varicella vaccine.** / Consult your health care provider.  Zoster vaccine.** / 1 dose for adults aged 27 years or older.  Pneumococcal 13-valent conjugate (PCV13) vaccine.** / Consult your health care provider.  Pneumococcal  polysaccharide (PPSV23) vaccine.** / 1 dose for all adults aged 68 years and older.  Meningococcal vaccine.** / Consult your health care provider.  Hepatitis A vaccine.** / Consult your health care provider.  Hepatitis B vaccine.** / Consult your health care provider.ep  Haemophilus influenzae type b (Hib) vaccine.** / Consult your health care provider. ** Family history and personal history of risk and conditions may change your health care provider's recommendations. Document Released: 12/25/2001 Document Revised: 03/15/2014 Document Reviewed: 03/26/2011 [facility] Patient Information 2015 [place], [place]. This information is not intended to replace advice given to you by your health care provider. Make sure you discuss any questions you have with your health care provider.

## 2015-04-18 NOTE — Progress Notes (Signed)
Pre visit review using our clinic review tool, if applicable. No additional management support is needed unless otherwise documented below in the visit note. 

## 2015-06-06 ENCOUNTER — Telehealth: Payer: Self-pay | Admitting: Family Medicine

## 2015-06-06 NOTE — Telephone Encounter (Signed)
PA initiated. Awaiting determination. JG//CMA 

## 2015-06-06 NOTE — Telephone Encounter (Signed)
Approved effective from 06/06/2015 through 11/11/2038.  

## 2015-06-06 NOTE — Telephone Encounter (Signed)
PA formed received today will forward to [place] to document.     KP

## 2015-06-06 NOTE — Telephone Encounter (Signed)
Caller name: Zoi Devine Relationship to patient: self Can be reached: [phone] Pharmacy: [facility] on [address]  Reason for call: [place] [facility] states they have sent paperwork to Dr. Etter Sjogren twice for authorization for RX diovan to be filled. They have the RX but said they need the forms before they can fill it. Pt has less than 1 week supply. Please f/u.

## 2015-12-09 ENCOUNTER — Other Ambulatory Visit: Payer: Self-pay

## 2015-12-09 DIAGNOSIS — I10 Essential (primary) hypertension: Secondary | ICD-10-CM

## 2015-12-09 NOTE — Telephone Encounter (Signed)
Error

## 2015-12-20 ENCOUNTER — Telehealth: Payer: Self-pay

## 2015-12-20 NOTE — Telephone Encounter (Signed)
Only if pt is ok with it

## 2015-12-20 NOTE — Telephone Encounter (Signed)
The pharmacy is requesting to switch to the Generic Diovan. Please advise     KP

## 2015-12-20 NOTE — Telephone Encounter (Signed)
Spoke with patient and she did not want to get the Generic, she said she has issues on it in the past and it caused a cough.     KP

## 2015-12-20 NOTE — Telephone Encounter (Signed)
Message left to call the office.    KP 

## 2015-12-20 NOTE — Telephone Encounter (Signed)
So answer is not-- she needs brand only

## 2016-01-05 ENCOUNTER — Encounter: Payer: Self-pay | Admitting: Behavioral Health

## 2016-04-10 ENCOUNTER — Other Ambulatory Visit: Payer: Self-pay | Admitting: Family Medicine

## 2016-05-18 ENCOUNTER — Other Ambulatory Visit: Payer: Self-pay | Admitting: Family Medicine

## 2016-07-19 ENCOUNTER — Encounter: Payer: Self-pay | Admitting: Family Medicine

## 2016-07-19 ENCOUNTER — Ambulatory Visit (INDEPENDENT_AMBULATORY_CARE_PROVIDER_SITE_OTHER): Payer: BLUE CROSS/BLUE SHIELD | Admitting: Family Medicine

## 2016-07-19 VITALS — BP 122/78 | HR 68 | Temp 97.8°F | Resp 17 | Ht 66.0 in | Wt 117.0 lb

## 2016-07-19 DIAGNOSIS — Z0000 Encounter for general adult medical examination without abnormal findings: Secondary | ICD-10-CM

## 2016-07-19 DIAGNOSIS — Z1159 Encounter for screening for other viral diseases: Secondary | ICD-10-CM

## 2016-07-19 DIAGNOSIS — I10 Essential (primary) hypertension: Secondary | ICD-10-CM | POA: Diagnosis not present

## 2016-07-19 DIAGNOSIS — M8580 Other specified disorders of bone density and structure, unspecified site: Secondary | ICD-10-CM | POA: Insufficient documentation

## 2016-07-19 DIAGNOSIS — R319 Hematuria, unspecified: Secondary | ICD-10-CM

## 2016-07-19 LAB — CBC WITH DIFFERENTIAL/PLATELET
BASOS ABS: 0 10*3/uL (ref 0.0–0.1)
BASOS PCT: 0.2 % (ref 0.0–3.0)
Eosinophils Absolute: 0.1 10*3/uL (ref 0.0–0.7)
Eosinophils Relative: 2 % (ref 0.0–5.0)
HCT: 38 % (ref 36.0–46.0)
Hemoglobin: 12.8 g/dL (ref 12.0–15.0)
LYMPHS ABS: 1.5 10*3/uL (ref 0.7–4.0)
Lymphocytes Relative: 38.4 % (ref 12.0–46.0)
MCHC: 33.6 g/dL (ref 30.0–36.0)
MCV: 90.3 fl (ref 78.0–100.0)
MONOS PCT: 8.4 % (ref 3.0–12.0)
Monocytes Absolute: 0.3 10*3/uL (ref 0.1–1.0)
NEUTROS PCT: 51 % (ref 43.0–77.0)
Neutro Abs: 2.1 10*3/uL (ref 1.4–7.7)
PLATELETS: 244 10*3/uL (ref 150.0–400.0)
RBC: 4.21 Mil/uL (ref 3.87–5.11)
RDW: 12.8 % (ref 11.5–15.5)
WBC: 4 10*3/uL (ref 4.0–10.5)

## 2016-07-19 LAB — TSH: TSH: 1.78 u[IU]/mL (ref 0.35–4.50)

## 2016-07-19 LAB — COMPREHENSIVE METABOLIC PANEL
ALK PHOS: 36 U/L — AB (ref 39–117)
ALT: 11 U/L (ref 0–35)
AST: 16 U/L (ref 0–37)
Albumin: 4.2 g/dL (ref 3.5–5.2)
BUN: 18 mg/dL (ref 6–23)
CHLORIDE: 103 meq/L (ref 96–112)
CO2: 29 mEq/L (ref 19–32)
Calcium: 9.1 mg/dL (ref 8.4–10.5)
Creatinine, Ser: 0.59 mg/dL (ref 0.40–1.20)
GFR: 109.95 mL/min (ref >60.00)
GLUCOSE: 97 mg/dL (ref 70–99)
POTASSIUM: 3.8 meq/L (ref 3.5–5.1)
Sodium: 138 mEq/L (ref 135–145)
TOTAL PROTEIN: 7.4 g/dL (ref 6.0–8.3)
Total Bilirubin: 0.4 mg/dL (ref 0.2–1.2)

## 2016-07-19 LAB — POCT URINALYSIS DIPSTICK
Bilirubin, UA: NEGATIVE
GLUCOSE UA: NEGATIVE
Ketones, UA: NEGATIVE
Leukocytes, UA: NEGATIVE
NITRITE UA: NEGATIVE
PH UA: 6
PROTEIN UA: NEGATIVE
UROBILINOGEN UA: 0.2

## 2016-07-19 LAB — LIPID PANEL
CHOL/HDL RATIO: 3
Cholesterol: 191 mg/dL (ref 0–200)
HDL: 70.8 mg/dL (ref >39.00)
LDL CALC: 98 mg/dL (ref 0–99)
NONHDL: 119.74
Triglycerides: 109 mg/dL (ref 0.0–149.0)
VLDL: 21.8 mg/dL (ref 0.0–40.0)

## 2016-07-19 MED ORDER — DIOVAN HCT 160-12.5 MG PO TABS: 1.0000 | ORAL_TABLET | Freq: Every day | ORAL | 3 refills | Status: DC

## 2016-07-19 MED ORDER — POTASSIUM CHLORIDE ER 10 MEQ PO TBCR: 10.0000 meq | EXTENDED_RELEASE_TABLET | Freq: Every day | ORAL | 3 refills | Status: DC

## 2016-07-19 NOTE — Progress Notes (Signed)
Pre visit review using our clinic review tool, if applicable. No additional management support is needed unless otherwise documented below in the visit note. 

## 2016-07-19 NOTE — Progress Notes (Signed)
Subjective:     Sherri Hoffman is a 60 y.o. female and is here for a comprehensive physical exam. The patient reports no problems.  Social History   Social History  . Marital status: Married    Spouse name: N/A  . Number of children: N/A  . Years of education: N/A   Occupational History  . [employer] Self   Social History Main Topics  . Smoking status: Never Smoker  . Smokeless tobacco: Never Used  . Alcohol use 1.8 oz/week    3 Glasses of wine per week  . Drug use: No  . Sexual activity: Yes    Partners: Male    Birth control/ protection: Post-menopausal   Other Topics Concern  . Not on file   Social History Narrative  . No narrative on file   Health Maintenance  Topic Date Due  . Hepatitis C Screening  1956-08-05  . HIV Screening  01/13/1970  . INFLUENZA VACCINE  06/12/2016  . TETANUS/TDAP  03/17/2017  . MAMMOGRAM  10/05/2017  . COLONOSCOPY  09/26/2018  . PAP SMEAR  10/05/2018  . ZOSTAVAX  Completed    The following portions of the patient's history were reviewed and updated as appropriate:  She  has a past medical history of Hypertension and Skin cancer, basal cell. She  does not have any pertinent problems on file. She  has a past surgical history that includes Myomectomy. Her family history includes COPD in her father and mother; Cancer in her father; Depression in her brother; Diabetes in her mother; Heart disease in her father and mother; Hyperlipidemia in her mother. She  reports that she has never smoked. She has never used smokeless tobacco. She reports that she drinks about 1.8 oz of alcohol per week . She reports that she does not use drugs. She has a current medication list which includes the following prescription(s): calcium carbonate-vitamin d, cholecalciferol, diovan hct, estradiol, levocetirizine, potassium chloride, and progesterone. Current Outpatient Prescriptions on File Prior to Visit  Medication Sig Dispense Refill  . Calcium  Carbonate-Vitamin D (CALCIUM 500/D) 500-125 MG-UNIT TABS Take by mouth.      . estradiol (ESTRACE) 1 MG tablet Take 1 tablet (1 mg total) by mouth daily. 90 tablet 3  . levocetirizine (XYZAL) 5 MG tablet TAKE 1 TABLET BY MOUTH DAILY AS NEEDED 90 tablet 3  . progesterone (PROMETRIUM) 100 MG capsule Take 1 capsule (100 mg total) by mouth daily. 90 capsule 4  . [DISCONTINUED] triamcinolone (NASACORT AQ) 55 MCG/ACT nasal inhaler Place 2 sprays into the nose daily. 1 Inhaler 12   No current facility-administered medications on file prior to visit.    She is allergic to ace inhibitors and erythromycin..  Review of Systems Review of Systems  Constitutional: Negative for activity change, appetite change and fatigue.  HENT: Negative for hearing loss, congestion, tinnitus and ear discharge.  dentist q41m Eyes: Negative for visual disturbance (see optho q1y -- vision corrected to 20/20 with glasses).  Respiratory: Negative for cough, chest tightness and shortness of breath.   Cardiovascular: Negative for chest pain, palpitations and leg swelling.  Gastrointestinal: Negative for abdominal pain, diarrhea, constipation and abdominal distention.  Genitourinary: Negative for urgency, frequency, decreased urine volume and difficulty urinating.  Musculoskeletal: Negative for back pain, arthralgias and gait problem.  Skin: Negative for color change, pallor and rash.  Neurological: Negative for dizziness, light-headedness, numbness and headaches.  Hematological: Negative for adenopathy. Does not bruise/bleed easily.  Psychiatric/Behavioral: Negative for suicidal ideas, confusion, sleep  disturbance, self-injury, dysphoric mood, decreased concentration and agitation.       Objective:    BP 122/78 (BP Location: Right Arm, Patient Position: Sitting, Cuff Size: Normal)   Pulse 68   Temp 97.8 F (36.6 C) (Oral)   Resp 17   Ht 5\' 6"  (1.676 m)   Wt 117 lb (53.1 kg)   SpO2 99%   BMI 18.88 kg/m  General  appearance: alert, cooperative, appears stated age and no distress Head: Normocephalic, without obvious abnormality, atraumatic Eyes: conjunctivae/corneas clear. PERRL, EOM's intact. Fundi benign. Ears: normal TM's and external ear canals both ears Nose: Nares normal. Septum midline. Mucosa normal. No drainage or sinus tenderness. Throat: lips, mucosa, and tongue normal; teeth and gums normal Neck: no adenopathy, no carotid bruit, no JVD, supple, symmetrical, trachea midline and thyroid not enlarged, symmetric, no tenderness/mass/nodules Back: symmetric, no curvature. ROM normal. No CVA tenderness. Lungs: clear to auscultation bilaterally Breasts: gyn Heart: regular rate and rhythm, S1, S2 normal, no murmur, click, rub or gallop Abdomen: soft, non-tender; bowel sounds normal; no masses,  no organomegaly Pelvic: deferred--gyn Extremities: extremities normal, atraumatic, no cyanosis or edema Pulses: 2+ and symmetric Skin: Skin color, texture, turgor normal. No rashes or lesions Lymph nodes: Cervical, supraclavicular, and axillary nodes normal. Neurologic: Alert and oriented X 3, normal strength and tone. Normal symmetric reflexes. Normal coordination and gait    Assessment:    Healthy female exam.      Plan:    ghm utd Check labs See After Visit Summary for Counseling Recommendations    1. Preventative health care See above - CBC with Differential/Platelet - Lipid panel - POCT urinalysis dipstick - TSH - Comprehensive metabolic panel - Hepatitis C antibody  2. Essential hypertension Refill med bp stable - CBC with Differential/Platelet - Lipid panel - POCT urinalysis dipstick - TSH - Comprehensive metabolic panel - potassium chloride (K-DUR) 10 MEQ tablet; Take 1 tablet (10 mEq total) by mouth daily.  Dispense: 90 tablet; Refill: 3 - DIOVAN HCT 160-12.5 MG tablet; Take 1 tablet by mouth daily.  Dispense: 90 tablet; Refill: 3  3. Need for hepatitis C screening  test   - Hepatitis C antibody  4. Osteopenia Per gyn

## 2016-07-19 NOTE — Patient Instructions (Signed)
Preventive Care for Adults, Female A healthy lifestyle and preventive care can promote health and wellness. Preventive health guidelines for women include the following key practices.  A routine yearly physical is a good way to check with your health care provider about your health and preventive screening. It is a chance to share any concerns and updates on your health and to receive a thorough exam.  Visit your dentist for a routine exam and preventive care every 6 months. Brush your teeth twice a day and floss once a day. Good oral hygiene prevents tooth decay and gum disease.  The frequency of eye exams is based on your age, health, family medical history, use of contact lenses, and other factors. Follow your health care provider's recommendations for frequency of eye exams.  Eat a healthy diet. Foods like vegetables, fruits, whole grains, low-fat dairy products, and lean protein foods contain the nutrients you need without too many calories. Decrease your intake of foods high in solid fats, added sugars, and salt. Eat the right amount of calories for you.Get information about a proper diet from your health care provider, if necessary.  Regular physical exercise is one of the most important things you can do for your health. Most adults should get at least 150 minutes of moderate-intensity exercise (any activity that increases your heart rate and causes you to sweat) each week. In addition, most adults need muscle-strengthening exercises on 2 or more days a week.  Maintain a healthy weight. The body mass index (BMI) is a screening tool to identify possible weight problems. It provides an estimate of body fat based on height and weight. Your health care provider can find your BMI and can help you achieve or maintain a healthy weight.For adults 20 years and older:  A BMI below 18.5 is considered underweight.  A BMI of 18.5 to 24.9 is normal.  A BMI of 25 to 29.9 is considered overweight.  A  BMI of 30 and above is considered obese.  Maintain normal blood lipids and cholesterol levels by exercising and minimizing your intake of saturated fat. Eat a balanced diet with plenty of fruit and vegetables. Blood tests for lipids and cholesterol should begin at age 45 and be repeated every 5 years. If your lipid or cholesterol levels are high, you are over 50, or you are at high risk for heart disease, you may need your cholesterol levels checked more frequently.Ongoing high lipid and cholesterol levels should be treated with medicines if diet and exercise are not working.  If you smoke, find out from your health care provider how to quit. If you do not use tobacco, do not start.  Lung cancer screening is recommended for adults aged 45-80 years who are at high risk for developing lung cancer because of a history of smoking. A yearly low-dose CT scan of the lungs is recommended for people who have at least a 30-pack-year history of smoking and are a current smoker or have quit within the past 15 years. A pack year of smoking is smoking an average of 1 pack of cigarettes a day for 1 year (for example: 1 pack a day for 30 years or 2 packs a day for 15 years). Yearly screening should continue until the smoker has stopped smoking for at least 15 years. Yearly screening should be stopped for people who develop a health problem that would prevent them from having lung cancer treatment.  If you are pregnant, do not drink alcohol. If you are  breastfeeding, be very cautious about drinking alcohol. If you are not pregnant and choose to drink alcohol, do not have more than 1 drink per day. One drink is considered to be 12 ounces (355 mL) of beer, 5 ounces (148 mL) of wine, or 1.5 ounces (44 mL) of liquor.  Avoid use of street drugs. Do not share needles with anyone. Ask for help if you need support or instructions about stopping the use of drugs.  High blood pressure causes heart disease and increases the risk  of stroke. Your blood pressure should be checked at least every 1 to 2 years. Ongoing high blood pressure should be treated with medicines if weight loss and exercise do not work.  If you are 55-79 years old, ask your health care provider if you should take aspirin to prevent strokes.  Diabetes screening is done by taking a blood sample to check your blood glucose level after you have not eaten for a certain period of time (fasting). If you are not overweight and you do not have risk factors for diabetes, you should be screened once every 3 years starting at age 45. If you are overweight or obese and you are 40-70 years of age, you should be screened for diabetes every year as part of your cardiovascular risk assessment.  Breast cancer screening is essential preventive care for women. You should practice "breast self-awareness." This means understanding the normal appearance and feel of your breasts and may include breast self-examination. Any changes detected, no matter how small, should be reported to a health care provider. Women in their 20s and 30s should have a clinical breast exam (CBE) by a health care provider as part of a regular health exam every 1 to 3 years. After age 40, women should have a CBE every year. Starting at age 40, women should consider having a mammogram (breast X-ray test) every year. Women who have a family history of breast cancer should talk to their health care provider about genetic screening. Women at a high risk of breast cancer should talk to their health care providers about having an MRI and a mammogram every year.  Breast cancer gene (BRCA)-related cancer risk assessment is recommended for women who have family members with BRCA-related cancers. BRCA-related cancers include breast, ovarian, tubal, and peritoneal cancers. Having family members with these cancers may be associated with an increased risk for harmful changes (mutations) in the breast cancer genes BRCA1 and  BRCA2. Results of the assessment will determine the need for genetic counseling and BRCA1 and BRCA2 testing.  Your health care provider may recommend that you be screened regularly for cancer of the pelvic organs (ovaries, uterus, and vagina). This screening involves a pelvic examination, including checking for microscopic changes to the surface of your cervix (Pap test). You may be encouraged to have this screening done every 3 years, beginning at age 21.  For women ages 30-65, health care providers may recommend pelvic exams and Pap testing every 3 years, or they may recommend the Pap and pelvic exam, combined with testing for human papilloma virus (HPV), every 5 years. Some types of HPV increase your risk of cervical cancer. Testing for HPV may also be done on women of any age with unclear Pap test results.  Other health care providers may not recommend any screening for nonpregnant women who are considered low risk for pelvic cancer and who do not have symptoms. Ask your health care provider if a screening pelvic exam is right for   you.  If you have had past treatment for cervical cancer or a condition that could lead to cancer, you need Pap tests and screening for cancer for at least 20 years after your treatment. If Pap tests have been discontinued, your risk factors (such as having a new sexual partner) need to be reassessed to determine if screening should resume. Some women have medical problems that increase the chance of getting cervical cancer. In these cases, your health care provider may recommend more frequent screening and Pap tests.  Colorectal cancer can be detected and often prevented. Most routine colorectal cancer screening begins at the age of 50 years and continues through age 75 years. However, your health care provider may recommend screening at an earlier age if you have risk factors for colon cancer. On a yearly basis, your health care provider may provide home test kits to check  for hidden blood in the stool. Use of a small camera at the end of a tube, to directly examine the colon (sigmoidoscopy or colonoscopy), can detect the earliest forms of colorectal cancer. Talk to your health care provider about this at age 50, when routine screening begins. Direct exam of the colon should be repeated every 5-10 years through age 75 years, unless early forms of precancerous polyps or small growths are found.  People who are at an increased risk for hepatitis B should be screened for this virus. You are considered at high risk for hepatitis B if:  You were born in a country where hepatitis B occurs often. Talk with your health care provider about which countries are considered high risk.  Your parents were born in a high-risk country and you have not received a shot to protect against hepatitis B (hepatitis B vaccine).  You have HIV or AIDS.  You use needles to inject street drugs.  You live with, or have sex with, someone who has hepatitis B.  You get hemodialysis treatment.  You take certain medicines for conditions like cancer, organ transplantation, and autoimmune conditions.  Hepatitis C blood testing is recommended for all people born from 1945 through 1965 and any individual with known risks for hepatitis C.  Practice safe sex. Use condoms and avoid high-risk sexual practices to reduce the spread of sexually transmitted infections (STIs). STIs include gonorrhea, chlamydia, syphilis, trichomonas, herpes, HPV, and human immunodeficiency virus (HIV). Herpes, HIV, and HPV are viral illnesses that have no cure. They can result in disability, cancer, and death.  You should be screened for sexually transmitted illnesses (STIs) including gonorrhea and chlamydia if:  You are sexually active and are younger than 24 years.  You are older than 24 years and your health care provider tells you that you are at risk for this type of infection.  Your sexual activity has changed  since you were last screened and you are at an increased risk for chlamydia or gonorrhea. Ask your health care provider if you are at risk.  If you are at risk of being infected with HIV, it is recommended that you take a prescription medicine daily to prevent HIV infection. This is called preexposure prophylaxis (PrEP). You are considered at risk if:  You are sexually active and do not regularly use condoms or know the HIV status of your partner(s).  You take drugs by injection.  You are sexually active with a partner who has HIV.  Talk with your health care provider about whether you are at high risk of being infected with HIV. If   you choose to begin PrEP, you should first be tested for HIV. You should then be tested every 3 months for as long as you are taking PrEP.  Osteoporosis is a disease in which the bones lose minerals and strength with aging. This can result in serious bone fractures or breaks. The risk of osteoporosis can be identified using a bone density scan. Women ages 67 years and over and women at risk for fractures or osteoporosis should discuss screening with their health care providers. Ask your health care provider whether you should take a calcium supplement or vitamin D to reduce the rate of osteoporosis.  Menopause can be associated with physical symptoms and risks. Hormone replacement therapy is available to decrease symptoms and risks. You should talk to your health care provider about whether hormone replacement therapy is right for you.  Use sunscreen. Apply sunscreen liberally and repeatedly throughout the day. You should seek shade when your shadow is shorter than you. Protect yourself by wearing long sleeves, pants, a wide-brimmed hat, and sunglasses year round, whenever you are outdoors.  Once a month, do a whole body skin exam, using a mirror to look at the skin on your back. Tell your health care provider of new moles, moles that have irregular borders, moles that  are larger than a pencil eraser, or moles that have changed in shape or color.  Stay current with required vaccines (immunizations).  Influenza vaccine. All adults should be immunized every year.  Tetanus, diphtheria, and acellular pertussis (Td, Tdap) vaccine. Pregnant women should receive 1 dose of Tdap vaccine during each pregnancy. The dose should be obtained regardless of the length of time since the last dose. Immunization is preferred during the 27th-36th week of gestation. An adult who has not previously received Tdap or who does not know her vaccine status should receive 1 dose of Tdap. This initial dose should be followed by tetanus and diphtheria toxoids (Td) booster doses every 10 years. Adults with an unknown or incomplete history of completing a 3-dose immunization series with Td-containing vaccines should begin or complete a primary immunization series including a Tdap dose. Adults should receive a Td booster every 10 years.  Varicella vaccine. An adult without evidence of immunity to varicella should receive 2 doses or a second dose if she has previously received 1 dose. Pregnant females who do not have evidence of immunity should receive the first dose after pregnancy. This first dose should be obtained before leaving the health care facility. The second dose should be obtained 4-8 weeks after the first dose.  Human papillomavirus (HPV) vaccine. Females aged 13-26 years who have not received the vaccine previously should obtain the 3-dose series. The vaccine is not recommended for use in pregnant females. However, pregnancy testing is not needed before receiving a dose. If a female is found to be pregnant after receiving a dose, no treatment is needed. In that case, the remaining doses should be delayed until after the pregnancy. Immunization is recommended for any person with an immunocompromised condition through the age of 61 years if she did not get any or all doses earlier. During the  3-dose series, the second dose should be obtained 4-8 weeks after the first dose. The third dose should be obtained 24 weeks after the first dose and 16 weeks after the second dose.  Zoster vaccine. One dose is recommended for adults aged 30 years or older unless certain conditions are present.  Measles, mumps, and rubella (MMR) vaccine. Adults born  before 1957 generally are considered immune to measles and mumps. Adults born in 1957 or later should have 1 or more doses of MMR vaccine unless there is a contraindication to the vaccine or there is laboratory evidence of immunity to each of the three diseases. A routine second dose of MMR vaccine should be obtained at least 28 days after the first dose for students attending postsecondary schools, health care workers, or international travelers. People who received inactivated measles vaccine or an unknown type of measles vaccine during 1963-1967 should receive 2 doses of MMR vaccine. People who received inactivated mumps vaccine or an unknown type of mumps vaccine before 1979 and are at high risk for mumps infection should consider immunization with 2 doses of MMR vaccine. For females of childbearing age, rubella immunity should be determined. If there is no evidence of immunity, females who are not pregnant should be vaccinated. If there is no evidence of immunity, females who are pregnant should delay immunization until after pregnancy. Unvaccinated health care workers born before 1957 who lack laboratory evidence of measles, mumps, or rubella immunity or laboratory confirmation of disease should consider measles and mumps immunization with 2 doses of MMR vaccine or rubella immunization with 1 dose of MMR vaccine.  Pneumococcal 13-valent conjugate (PCV13) vaccine. When indicated, a person who is uncertain of his immunization history and has no record of immunization should receive the PCV13 vaccine. All adults 65 years of age and older should receive this  vaccine. An adult aged 19 years or older who has certain medical conditions and has not been previously immunized should receive 1 dose of PCV13 vaccine. This PCV13 should be followed with a dose of pneumococcal polysaccharide (PPSV23) vaccine. Adults who are at high risk for pneumococcal disease should obtain the PPSV23 vaccine at least 8 weeks after the dose of PCV13 vaccine. Adults older than 61 years of age who have normal immune system function should obtain the PPSV23 vaccine dose at least 1 year after the dose of PCV13 vaccine.  Pneumococcal polysaccharide (PPSV23) vaccine. When PCV13 is also indicated, PCV13 should be obtained first. All adults aged 65 years and older should be immunized. An adult younger than age 65 years who has certain medical conditions should be immunized. Any person who resides in a nursing home or long-term care facility should be immunized. An adult smoker should be immunized. People with an immunocompromised condition and certain other conditions should receive both PCV13 and PPSV23 vaccines. People with human immunodeficiency virus (HIV) infection should be immunized as soon as possible after diagnosis. Immunization during chemotherapy or radiation therapy should be avoided. Routine use of PPSV23 vaccine is not recommended for American Indians, [place] Natives, or people younger than 65 years unless there are medical conditions that require PPSV23 vaccine. When indicated, people who have unknown immunization and have no record of immunization should receive PPSV23 vaccine. One-time revaccination 5 years after the first dose of PPSV23 is recommended for people aged 19-64 years who have chronic kidney failure, nephrotic syndrome, asplenia, or immunocompromised conditions. People who received 1-2 doses of PPSV23 before age 65 years should receive another dose of PPSV23 vaccine at age 65 years or later if at least 5 years have passed since the previous dose. Doses of PPSV23 are not  needed for people immunized with PPSV23 at or after age 65 years.  Meningococcal vaccine. Adults with asplenia or persistent complement component deficiencies should receive 2 doses of quadrivalent meningococcal conjugate (MenACWY-D) vaccine. The doses should be obtained   at least 2 months apart. Microbiologists working with certain meningococcal bacteria, [place] recruits, people at risk during an outbreak, and people who travel to or live in countries with a high rate of meningitis should be immunized. A first-year college student up through age 34 years who is living in a residence hall should receive a dose if she did not receive a dose on or after her 16th birthday. Adults who have certain high-risk conditions should receive one or more doses of vaccine.  Hepatitis A vaccine. Adults who wish to be protected from this disease, have certain high-risk conditions, work with hepatitis A-infected animals, work in hepatitis A research labs, or travel to or work in countries with a high rate of hepatitis A should be immunized. Adults who were previously unvaccinated and who anticipate close contact with an international adoptee during the first 60 days after arrival in the [place] from a country with a high rate of hepatitis A should be immunized.  Hepatitis B vaccine. Adults who wish to be protected from this disease, have certain high-risk conditions, may be exposed to blood or other infectious body fluids, are household contacts or sex partners of hepatitis B positive people, are clients or workers in certain care facilities, or travel to or work in countries with a high rate of hepatitis B should be immunized.  Haemophilus influenzae type b (Hib) vaccine. A previously unvaccinated person with asplenia or sickle cell disease or having a scheduled splenectomy should receive 1 dose of Hib vaccine. Regardless of previous immunization, a recipient of a hematopoietic stem cell transplant should receive a  3-dose series 6-12 months after her successful transplant. Hib vaccine is not recommended for adults with HIV infection. Preventive Services / Frequency Ages 35 to 4 years  Blood pressure check.** / Every 3-5 years.  Lipid and cholesterol check.** / Every 5 years beginning at age 60.  Clinical breast exam.** / Every 3 years for women in their 71s and 10s.  BRCA-related cancer risk assessment.** / For women who have family members with a BRCA-related cancer (breast, ovarian, tubal, or peritoneal cancers).  Pap test.** / Every 2 years from ages 76 through 26. Every 3 years starting at age 61 through age 76 or 93 with a history of 3 consecutive normal Pap tests.  HPV screening.** / Every 3 years from ages 37 through ages 60 to 51 with a history of 3 consecutive normal Pap tests.  Hepatitis C blood test.** / For any individual with known risks for hepatitis C.  Skin self-exam. / Monthly.  Influenza vaccine. / Every year.  Tetanus, diphtheria, and acellular pertussis (Tdap, Td) vaccine.** / Consult your health care provider. Pregnant women should receive 1 dose of Tdap vaccine during each pregnancy. 1 dose of Td every 10 years.  Varicella vaccine.** / Consult your health care provider. Pregnant females who do not have evidence of immunity should receive the first dose after pregnancy.  HPV vaccine. / 3 doses over 6 months, if 93 and younger. The vaccine is not recommended for use in pregnant females. However, pregnancy testing is not needed before receiving a dose.  Measles, mumps, rubella (MMR) vaccine.** / You need at least 1 dose of MMR if you were born in 1957 or later. You may also need a 2nd dose. For females of childbearing age, rubella immunity should be determined. If there is no evidence of immunity, females who are not pregnant should be vaccinated. If there is no evidence of immunity, females who are  pregnant should delay immunization until after pregnancy.  Pneumococcal  13-valent conjugate (PCV13) vaccine.** / Consult your health care provider.  Pneumococcal polysaccharide (PPSV23) vaccine.** / 1 to 2 doses if you smoke cigarettes or if you have certain conditions.  Meningococcal vaccine.** / 1 dose if you are age 68 to 8 years and a Market researcher living in a residence hall, or have one of several medical conditions, you need to get vaccinated against meningococcal disease. You may also need additional booster doses.  Hepatitis A vaccine.** / Consult your health care provider.  Hepatitis B vaccine.** / Consult your health care provider.  Haemophilus influenzae type b (Hib) vaccine.** / Consult your health care provider. Ages 7 to 53 years  Blood pressure check.** / Every year.  Lipid and cholesterol check.** / Every 5 years beginning at age 25 years.  Lung cancer screening. / Every year if you are aged 11-80 years and have a 30-pack-year history of smoking and currently smoke or have quit within the past 15 years. Yearly screening is stopped once you have quit smoking for at least 15 years or develop a health problem that would prevent you from having lung cancer treatment.  Clinical breast exam.** / Every year after age 48 years.  BRCA-related cancer risk assessment.** / For women who have family members with a BRCA-related cancer (breast, ovarian, tubal, or peritoneal cancers).  Mammogram.** / Every year beginning at age 41 years and continuing for as long as you are in good health. Consult with your health care provider.  Pap test.** / Every 3 years starting at age 65 years through age 37 or 70 years with a history of 3 consecutive normal Pap tests.  HPV screening.** / Every 3 years from ages 72 years through ages 60 to 40 years with a history of 3 consecutive normal Pap tests.  Fecal occult blood test (FOBT) of stool. / Every year beginning at age 21 years and continuing until age 5 years. You may not need to do this test if you get  a colonoscopy every 10 years.  Flexible sigmoidoscopy or colonoscopy.** / Every 5 years for a flexible sigmoidoscopy or every 10 years for a colonoscopy beginning at age 35 years and continuing until age 48 years.  Hepatitis C blood test.** / For all people born from [birth year] through 1965 and any individual with known risks for hepatitis C.  Skin self-exam. / Monthly.  Influenza vaccine. / Every year.  Tetanus, diphtheria, and acellular pertussis (Tdap/Td) vaccine.** / Consult your health care provider. Pregnant women should receive 1 dose of Tdap vaccine during each pregnancy. 1 dose of Td every 10 years.  Varicella vaccine.** / Consult your health care provider. Pregnant females who do not have evidence of immunity should receive the first dose after pregnancy.  Zoster vaccine.** / 1 dose for adults aged 30 years or older.  Measles, mumps, rubella (MMR) vaccine.** / You need at least 1 dose of MMR if you were born in 1957 or later. You may also need a second dose. For females of childbearing age, rubella immunity should be determined. If there is no evidence of immunity, females who are not pregnant should be vaccinated. If there is no evidence of immunity, females who are pregnant should delay immunization until after pregnancy.  Pneumococcal 13-valent conjugate (PCV13) vaccine.** / Consult your health care provider.  Pneumococcal polysaccharide (PPSV23) vaccine.** / 1 to 2 doses if you smoke cigarettes or if you have certain conditions.  Meningococcal vaccine.** /  Consult your health care provider.  Hepatitis A vaccine.** / Consult your health care provider.  Hepatitis B vaccine.** / Consult your health care provider.  Haemophilus influenzae type b (Hib) vaccine.** / Consult your health care provider. Ages 64 years and over  Blood pressure check.** / Every year.  Lipid and cholesterol check.** / Every 5 years beginning at age 23 years.  Lung cancer screening. / Every year if you  are aged 16-80 years and have a 30-pack-year history of smoking and currently smoke or have quit within the past 15 years. Yearly screening is stopped once you have quit smoking for at least 15 years or develop a health problem that would prevent you from having lung cancer treatment.  Clinical breast exam.** / Every year after age 74 years.  BRCA-related cancer risk assessment.** / For women who have family members with a BRCA-related cancer (breast, ovarian, tubal, or peritoneal cancers).  Mammogram.** / Every year beginning at age 44 years and continuing for as long as you are in good health. Consult with your health care provider.  Pap test.** / Every 3 years starting at age 58 years through age 22 or 39 years with 3 consecutive normal Pap tests. Testing can be stopped between 65 and 70 years with 3 consecutive normal Pap tests and no abnormal Pap or HPV tests in the past 10 years.  HPV screening.** / Every 3 years from ages 64 years through ages 70 or 61 years with a history of 3 consecutive normal Pap tests. Testing can be stopped between 65 and 70 years with 3 consecutive normal Pap tests and no abnormal Pap or HPV tests in the past 10 years.  Fecal occult blood test (FOBT) of stool. / Every year beginning at age 40 years and continuing until age 27 years. You may not need to do this test if you get a colonoscopy every 10 years.  Flexible sigmoidoscopy or colonoscopy.** / Every 5 years for a flexible sigmoidoscopy or every 10 years for a colonoscopy beginning at age 7 years and continuing until age 32 years.  Hepatitis C blood test.** / For all people born from [birth year] through 1966 and any individual with known risks for hepatitis C.  Osteoporosis screening.** / A one-time screening for women ages 30 years and over and women at risk for fractures or osteoporosis.  Skin self-exam. / Monthly.  Influenza vaccine. / Every year.  Tetanus, diphtheria, and acellular pertussis (Tdap/Td)  vaccine.** / 1 dose of Td every 10 years.  Varicella vaccine.** / Consult your health care provider.  Zoster vaccine.** / 1 dose for adults aged 35 years or older.  Pneumococcal 13-valent conjugate (PCV13) vaccine.** / Consult your health care provider.  Pneumococcal polysaccharide (PPSV23) vaccine.** / 1 dose for all adults aged 46 years and older.  Meningococcal vaccine.** / Consult your health care provider.  Hepatitis A vaccine.** / Consult your health care provider.  Hepatitis B vaccine.** / Consult your health care provider.  Haemophilus influenzae type b (Hib) vaccine.** / Consult your health care provider. ** Family history and personal history of risk and conditions may change your health care provider's recommendations.   This information is not intended to replace advice given to you by your health care provider. Make sure you discuss any questions you have with your health care provider.   Document Released: 12/25/2001 Document Revised: 11/19/2014 Document Reviewed: 03/26/2011 Elsevier Interactive Patient Education [employer].

## 2016-07-20 LAB — URINE CULTURE

## 2016-07-20 LAB — HEPATITIS C ANTIBODY: HCV Ab: NEGATIVE

## 2016-09-17 DIAGNOSIS — Z1231 Encounter for screening mammogram for malignant neoplasm of breast: Secondary | ICD-10-CM | POA: Diagnosis not present

## 2016-09-17 DIAGNOSIS — Z01419 Encounter for gynecological examination (general) (routine) without abnormal findings: Secondary | ICD-10-CM | POA: Diagnosis not present

## 2016-09-17 DIAGNOSIS — Z681 Body mass index (BMI) 19 or less, adult: Secondary | ICD-10-CM | POA: Diagnosis not present

## 2016-09-17 DIAGNOSIS — Z124 Encounter for screening for malignant neoplasm of cervix: Secondary | ICD-10-CM | POA: Diagnosis not present

## 2016-10-09 ENCOUNTER — Ambulatory Visit (INDEPENDENT_AMBULATORY_CARE_PROVIDER_SITE_OTHER): Payer: BLUE CROSS/BLUE SHIELD | Admitting: Family Medicine

## 2016-10-09 ENCOUNTER — Encounter: Payer: Self-pay | Admitting: Family Medicine

## 2016-10-09 VITALS — BP 150/88 | HR 73 | Temp 98.0°F | Resp 16 | Ht 66.0 in | Wt 120.4 lb

## 2016-10-09 DIAGNOSIS — J0140 Acute pansinusitis, unspecified: Secondary | ICD-10-CM

## 2016-10-09 MED ORDER — CEFUROXIME AXETIL 500 MG PO TABS: 500.0000 mg | ORAL_TABLET | Freq: Two times a day (BID) | ORAL | 0 refills | Status: DC

## 2016-10-09 NOTE — Patient Instructions (Signed)

## 2016-10-09 NOTE — Progress Notes (Signed)
Pre visit review using our clinic review tool, if applicable. No additional management support is needed unless otherwise documented below in the visit note. 

## 2016-10-09 NOTE — Progress Notes (Signed)
              Subjective:     Sherri Hoffman is a 60 y.o. female who presents for evaluation of sinus pain. Symptoms include: congestion, headaches, nasal congestion, post nasal drip and sinus pressure. Onset of symptoms was 3 weeks ago. Symptoms have been gradually worsening since that time. Past history is significant for no history of pneumonia or bronchitis. Patient is a non-smoker.  The following portions of the patient's history were reviewed and updated as appropriate: She  has a past medical history of Hypertension and Skin cancer, basal cell. She  does not have any pertinent problems on file. She  has a past surgical history that includes Myomectomy. Her family history includes COPD in her father and mother; Cancer in her father; Depression in her brother; Diabetes in her mother; Heart disease in her father and mother; Hyperlipidemia in her mother. She  reports that she has never smoked. She has never used smokeless tobacco. She reports that she drinks about 1.8 oz of alcohol per week . She reports that she does not use drugs. She has a current medication list which includes the following prescription(s): calcium carbonate-vitamin d, cholecalciferol, diovan hct, estradiol, levocetirizine, potassium chloride, and progesterone. Current Outpatient Prescriptions on File Prior to Visit  Medication Sig Dispense Refill  . Calcium Carbonate-Vitamin D (CALCIUM 500/D) 500-125 MG-UNIT TABS Take by mouth.      . cholecalciferol (VITAMIN D) 1000 units tablet Take 1,000 Units by mouth daily.    Marland Kitchen DIOVAN HCT 160-12.5 MG tablet Take 1 tablet by mouth daily. 90 tablet 3  . estradiol (ESTRACE) 1 MG tablet Take 1 tablet (1 mg total) by mouth daily. 90 tablet 3  . levocetirizine (XYZAL) 5 MG tablet TAKE 1 TABLET BY MOUTH DAILY AS NEEDED 90 tablet 3  . potassium chloride (K-DUR) 10 MEQ tablet Take 1 tablet (10 mEq total) by mouth daily. 90 tablet 3  . progesterone (PROMETRIUM) 100 MG capsule Take 1  capsule (100 mg total) by mouth daily. 90 capsule 4  . [DISCONTINUED] triamcinolone (NASACORT AQ) 55 MCG/ACT nasal inhaler Place 2 sprays into the nose daily. 1 Inhaler 12   No current facility-administered medications on file prior to visit.    She is allergic to ace inhibitors and erythromycin..  Review of Systems Pertinent items are noted in HPI.    Objective:    BP (!) 158/82 (BP Location: Left Arm, Patient Position: Sitting, Cuff Size: Normal)   Pulse 73   Temp 98 F (36.7 C) (Oral)   Resp 16   Ht 5\' 6"  (1.676 m)   Wt 120 lb 6.4 oz (54.6 kg)   SpO2 100%   BMI 19.43 kg/m  General appearance: alert, cooperative, appears stated age and no distress Ears: normal TM's and external ear canals both ears Nose: moderate congestion, turbinates red, swollen, sinus tenderness bilateral Throat: abnormal findings: mild oropharyngeal erythema Neck: mild anterior cervical adenopathy, supple, symmetrical, trachea midline and thyroid not enlarged, symmetric, no tenderness/mass/nodules Lungs: clear to auscultation bilaterally Heart: S1, S2 normal    Assessment:    Acute bacterial sinusitis.    Plan:    Nasal steroids per medication orders. Antihistamines per medication orders. Ceftin per medication orders.

## 2017-01-09 DIAGNOSIS — L821 Other seborrheic keratosis: Secondary | ICD-10-CM | POA: Diagnosis not present

## 2017-01-09 DIAGNOSIS — D1801 Hemangioma of skin and subcutaneous tissue: Secondary | ICD-10-CM | POA: Diagnosis not present

## 2017-01-09 DIAGNOSIS — D225 Melanocytic nevi of trunk: Secondary | ICD-10-CM | POA: Diagnosis not present

## 2017-01-09 DIAGNOSIS — L814 Other melanin hyperpigmentation: Secondary | ICD-10-CM | POA: Diagnosis not present

## 2017-01-17 ENCOUNTER — Ambulatory Visit (INDEPENDENT_AMBULATORY_CARE_PROVIDER_SITE_OTHER): Payer: BLUE CROSS/BLUE SHIELD | Admitting: Family Medicine

## 2017-01-17 ENCOUNTER — Telehealth: Payer: Self-pay | Admitting: Family Medicine

## 2017-01-17 ENCOUNTER — Encounter: Payer: Self-pay | Admitting: Family Medicine

## 2017-01-17 VITALS — BP 138/80 | HR 64 | Temp 97.9°F | Resp 16 | Ht 66.0 in | Wt 121.4 lb

## 2017-01-17 DIAGNOSIS — R52 Pain, unspecified: Secondary | ICD-10-CM | POA: Diagnosis not present

## 2017-01-17 DIAGNOSIS — J029 Acute pharyngitis, unspecified: Secondary | ICD-10-CM

## 2017-01-17 DIAGNOSIS — R05 Cough: Secondary | ICD-10-CM

## 2017-01-17 DIAGNOSIS — J324 Chronic pansinusitis: Secondary | ICD-10-CM

## 2017-01-17 DIAGNOSIS — R059 Cough, unspecified: Secondary | ICD-10-CM

## 2017-01-17 LAB — POC INFLUENZA A&B (BINAX/QUICKVUE)
INFLUENZA B, POC: NEGATIVE
Influenza A, POC: NEGATIVE

## 2017-01-17 LAB — POCT RAPID STREP A (OFFICE): RAPID STREP A SCREEN: NEGATIVE

## 2017-01-17 MED ORDER — AMOXICILLIN-POT CLAVULANATE 875-125 MG PO TABS: 1.0000 | ORAL_TABLET | Freq: Two times a day (BID) | ORAL | 0 refills | Status: DC

## 2017-01-17 NOTE — Telephone Encounter (Signed)
Caller name: Relationship to patient: Self Can be reached: [phone]  Pharmacy:  [facility] [place], [address] AT [facility] [address] [place] [phone] (Phone) [phone] (Fax)     Reason for call: Request a Rx for a cough syrup. States provider was suppose to send it in today

## 2017-01-17 NOTE — Progress Notes (Signed)
Pre visit review using our clinic review tool, if applicable. No additional management support is needed unless otherwise documented below in the visit note. 

## 2017-01-17 NOTE — Progress Notes (Signed)
Patient ID: Marquis Diles, female   DOB: 09/02/1956, 61 y.o.   MRN: [id number]     Subjective:  I acted as a scribe for Dr. Royden Purl, LPN    Patient ID: Janeli Lewison, female    DOB: 12-18-1955, 61 y.o.   MRN: [id number]  Chief Complaint  Patient presents with  . Sore Throat  . Cough    HPI  Patient is in today for c/o pressure over her eyes, coughing, sore throat and drainage since Monday.   Patient Care Team: Ann Held, DO as PCP - General Delsa Bern, MD as Consulting Physician (Obstetrics and Gynecology)   Past Medical History:  Diagnosis Date  . Hypertension   . Skin cancer, basal cell     Past Surgical History:  Procedure Laterality Date  . MYOMECTOMY      Family History  Problem Relation Age of Onset  . Heart disease Mother   . Diabetes Mother   . Hyperlipidemia Mother   . COPD Mother   . Cancer Father     melanoma  . Heart disease Father     chf  . COPD Father   . Skin cancer      Melanoma  . Coronary artery disease    . COPD    . Cancer      skin, melanoma  . Depression Brother     Social History   Social History  . Marital status: Married    Spouse name: N/A  . Number of children: N/A  . Years of education: N/A   Occupational History  . [employer] Self   Social History Main Topics  . Smoking status: Never Smoker  . Smokeless tobacco: Never Used  . Alcohol use 1.8 oz/week    3 Glasses of wine per week  . Drug use: No  . Sexual activity: Yes    Partners: Male    Birth control/ protection: Post-menopausal   Other Topics Concern  . Not on file   Social History Narrative  . No narrative on file    Outpatient Medications Prior to Visit  Medication Sig Dispense Refill  . Calcium Carbonate-Vitamin D (CALCIUM 500/D) 500-125 MG-UNIT TABS Take by mouth.      . cefUROXime (CEFTIN) 500 MG tablet Take 1 tablet (500 mg total) by mouth 2 (two) times daily with a meal. 20 tablet 0  . cholecalciferol (VITAMIN D) 1000  units tablet Take 1,000 Units by mouth daily.    Marland Kitchen DIOVAN HCT 160-12.5 MG tablet Take 1 tablet by mouth daily. 90 tablet 3  . estradiol (ESTRACE) 1 MG tablet Take 1 tablet (1 mg total) by mouth daily. 90 tablet 3  . levocetirizine (XYZAL) 5 MG tablet TAKE 1 TABLET BY MOUTH DAILY AS NEEDED 90 tablet 3  . potassium chloride (K-DUR) 10 MEQ tablet Take 1 tablet (10 mEq total) by mouth daily. 90 tablet 3  . progesterone (PROMETRIUM) 100 MG capsule Take 1 capsule (100 mg total) by mouth daily. 90 capsule 4   No facility-administered medications prior to visit.     Allergies  Allergen Reactions  . Ace Inhibitors     REACTION: COUGH  . Erythromycin     REACTION: NAUSEA    Review of Systems  Constitutional: Negative for chills, fever and malaise/fatigue.  HENT: Positive for congestion, sinus pain and sore throat. Negative for hearing loss.   Eyes: Negative for discharge.  Respiratory: Negative for cough, sputum production and shortness of breath.   Cardiovascular:  Negative for chest pain, palpitations and leg swelling.  Gastrointestinal: Negative for abdominal pain, blood in stool, constipation, diarrhea, heartburn, nausea and vomiting.  Genitourinary: Negative for dysuria, frequency, hematuria and urgency.  Musculoskeletal: Negative for back pain, falls and myalgias.  Skin: Negative for rash.  Neurological: Negative for dizziness, sensory change, loss of consciousness, weakness and headaches.  Endo/Heme/Allergies: Negative for environmental allergies. Does not bruise/bleed easily.  Psychiatric/Behavioral: Negative for depression and suicidal ideas. The patient is not nervous/anxious and does not have insomnia.        Objective:    Physical Exam  Constitutional: She is oriented to person, place, and time. She appears well-developed and well-nourished.  HENT:  Right Ear: Hearing, tympanic membrane, external ear and ear canal normal.  Left Ear: Hearing, tympanic membrane, external ear  and ear canal normal.  Nose: Right sinus exhibits maxillary sinus tenderness and frontal sinus tenderness. Left sinus exhibits maxillary sinus tenderness and frontal sinus tenderness.  + PND + errythema  Eyes: Conjunctivae are normal. Right eye exhibits no discharge. Left eye exhibits no discharge.  Cardiovascular: Normal rate, regular rhythm and normal heart sounds.   No murmur heard. Pulmonary/Chest: Effort normal and breath sounds normal. No respiratory distress. She has no wheezes. She has no rales. She exhibits no tenderness.  Musculoskeletal: She exhibits no edema.  Lymphadenopathy:    She has cervical adenopathy.  Neurological: She is alert and oriented to person, place, and time.  Nursing note and vitals reviewed.   BP 138/80 (BP Location: Left Arm, Patient Position: Sitting, Cuff Size: Normal)   Pulse 64   Temp 97.9 F (36.6 C) (Oral)   Resp 16   Ht 5\' 6"  (1.676 m)   Wt 121 lb 6.4 oz (55.1 kg)   SpO2 97%   BMI 19.59 kg/m  Wt Readings from Last 3 Encounters:  01/17/17 121 lb 6.4 oz (55.1 kg)  10/09/16 120 lb 6.4 oz (54.6 kg)  07/19/16 117 lb (53.1 kg)     Lab Results  Component Value Date   WBC 4.0 07/19/2016   HGB 12.8 07/19/2016   HCT 38.0 07/19/2016   PLT 244.0 07/19/2016   GLUCOSE 97 07/19/2016   CHOL 191 07/19/2016   TRIG 109.0 07/19/2016   HDL 70.80 07/19/2016   LDLCALC 98 07/19/2016   ALT 11 07/19/2016   AST 16 07/19/2016   NA 138 07/19/2016   K 3.8 07/19/2016   CL 103 07/19/2016   CREATININE 0.59 07/19/2016   BUN 18 07/19/2016   CO2 29 07/19/2016   TSH 1.78 07/19/2016   HGBA1C 5.5 01/11/2014   MICROALBUR 0.9 04/18/2015    Lab Results  Component Value Date   TSH 1.78 07/19/2016   Lab Results  Component Value Date   WBC 4.0 07/19/2016   HGB 12.8 07/19/2016   HCT 38.0 07/19/2016   MCV 90.3 07/19/2016   PLT 244.0 07/19/2016   Lab Results  Component Value Date   NA 138 07/19/2016   K 3.8 07/19/2016   CO2 29 07/19/2016   GLUCOSE 97  07/19/2016   BUN 18 07/19/2016   CREATININE 0.59 07/19/2016   BILITOT 0.4 07/19/2016   ALKPHOS 36 (L) 07/19/2016   AST 16 07/19/2016   ALT 11 07/19/2016   PROT 7.4 07/19/2016   ALBUMIN 4.2 07/19/2016   CALCIUM 9.1 07/19/2016   GFR 109.95 07/19/2016   Lab Results  Component Value Date   CHOL 191 07/19/2016   Lab Results  Component Value Date   HDL 70.80  07/19/2016   Lab Results  Component Value Date   LDLCALC 98 07/19/2016   Lab Results  Component Value Date   TRIG 109.0 07/19/2016   Lab Results  Component Value Date   CHOLHDL 3 07/19/2016   Lab Results  Component Value Date   HGBA1C 5.5 01/11/2014       Assessment & Plan:   Problem List Items Addressed This Visit      Unprioritized   Pansinusitis - Primary    augmentin flonase Cough meds per orders      Relevant Medications   amoxicillin-clavulanate (AUGMENTIN) 875-125 MG tablet   promethazine-dextromethorphan (PROMETHAZINE-DM) 6.25-15 MG/5ML syrup    Other Visit Diagnoses    Sore throat       Relevant Orders   POCT rapid strep A (Completed)   Body aches       Relevant Orders   POC Influenza A&B (Binax test) (Completed)   Cough       Relevant Medications   promethazine-dextromethorphan (PROMETHAZINE-DM) 6.25-15 MG/5ML syrup      I am having Ms. Cargile start on amoxicillin-clavulanate and promethazine-dextromethorphan. I am also having her maintain her Calcium Carbonate-Vitamin D, progesterone, estradiol, levocetirizine, cholecalciferol, potassium chloride, DIOVAN HCT, and cefUROXime.  Meds ordered this encounter  Medications  . amoxicillin-clavulanate (AUGMENTIN) 875-125 MG tablet    Sig: Take 1 tablet by mouth 2 (two) times daily.    Dispense:  20 tablet    Refill:  0  . promethazine-dextromethorphan (PROMETHAZINE-DM) 6.25-15 MG/5ML syrup    Sig: Take 5 mLs by mouth 4 (four) times daily as needed.    Dispense:  118 mL    Refill:  0    CMA served as scribe during this visit. History,  Physical and Plan performed by medical provider. Documentation and orders reviewed and attested to.  Ann Held, DO

## 2017-01-17 NOTE — Patient Instructions (Signed)

## 2017-01-20 DIAGNOSIS — J324 Chronic pansinusitis: Secondary | ICD-10-CM | POA: Insufficient documentation

## 2017-01-20 MED ORDER — PROMETHAZINE-DM 6.25-15 MG/5ML PO SYRP: 5.0000 mL | ORAL_SOLUTION | Freq: Four times a day (QID) | ORAL | 0 refills | Status: DC | PRN

## 2017-01-20 NOTE — Assessment & Plan Note (Signed)
augmentin flonase Cough meds per orders

## 2017-07-09 ENCOUNTER — Other Ambulatory Visit: Payer: Self-pay | Admitting: Family Medicine

## 2017-07-09 DIAGNOSIS — I10 Essential (primary) hypertension: Secondary | ICD-10-CM

## 2017-07-30 DIAGNOSIS — L811 Chloasma: Secondary | ICD-10-CM | POA: Diagnosis not present

## 2017-08-11 ENCOUNTER — Other Ambulatory Visit: Payer: Self-pay | Admitting: Family Medicine

## 2017-08-11 DIAGNOSIS — I10 Essential (primary) hypertension: Secondary | ICD-10-CM

## 2017-08-28 ENCOUNTER — Other Ambulatory Visit: Payer: Self-pay | Admitting: Family Medicine

## 2017-08-28 DIAGNOSIS — I10 Essential (primary) hypertension: Secondary | ICD-10-CM

## 2017-09-20 ENCOUNTER — Other Ambulatory Visit: Payer: Self-pay | Admitting: Family Medicine

## 2017-09-20 DIAGNOSIS — I10 Essential (primary) hypertension: Secondary | ICD-10-CM

## 2017-09-24 DIAGNOSIS — Z1231 Encounter for screening mammogram for malignant neoplasm of breast: Secondary | ICD-10-CM | POA: Diagnosis not present

## 2017-09-24 DIAGNOSIS — Z01419 Encounter for gynecological examination (general) (routine) without abnormal findings: Secondary | ICD-10-CM | POA: Diagnosis not present

## 2017-09-24 DIAGNOSIS — Z681 Body mass index (BMI) 19 or less, adult: Secondary | ICD-10-CM | POA: Diagnosis not present

## 2017-09-24 DIAGNOSIS — Z13820 Encounter for screening for osteoporosis: Secondary | ICD-10-CM | POA: Diagnosis not present

## 2017-10-13 ENCOUNTER — Other Ambulatory Visit: Payer: Self-pay | Admitting: Family Medicine

## 2017-10-13 DIAGNOSIS — I10 Essential (primary) hypertension: Secondary | ICD-10-CM

## 2017-10-15 ENCOUNTER — Encounter: Payer: Self-pay | Admitting: Family Medicine

## 2017-10-15 ENCOUNTER — Ambulatory Visit: Payer: BLUE CROSS/BLUE SHIELD | Admitting: Family Medicine

## 2017-10-15 VITALS — BP 120/86 | HR 77 | Temp 98.2°F | Resp 16 | Ht 66.0 in | Wt 119.4 lb

## 2017-10-15 DIAGNOSIS — J301 Allergic rhinitis due to pollen: Secondary | ICD-10-CM

## 2017-10-15 DIAGNOSIS — J324 Chronic pansinusitis: Secondary | ICD-10-CM | POA: Diagnosis not present

## 2017-10-15 DIAGNOSIS — R05 Cough: Secondary | ICD-10-CM

## 2017-10-15 DIAGNOSIS — R059 Cough, unspecified: Secondary | ICD-10-CM

## 2017-10-15 MED ORDER — FLUTICASONE PROPIONATE 50 MCG/ACT NA SUSP: 2.0000 | Freq: Every day | NASAL | 6 refills | Status: DC

## 2017-10-15 MED ORDER — PROMETHAZINE-DM 6.25-15 MG/5ML PO SYRP: 5.0000 mL | ORAL_SOLUTION | Freq: Four times a day (QID) | ORAL | 0 refills | Status: DC | PRN

## 2017-10-15 MED ORDER — LEVOCETIRIZINE DIHYDROCHLORIDE 5 MG PO TABS: 5.0000 mg | ORAL_TABLET | Freq: Every day | ORAL | 1 refills | Status: DC | PRN

## 2017-10-15 MED ORDER — AMOXICILLIN-POT CLAVULANATE 875-125 MG PO TABS: 1.0000 | ORAL_TABLET | Freq: Two times a day (BID) | ORAL | 0 refills | Status: DC

## 2017-10-15 NOTE — Progress Notes (Signed)
Patient ID: Sherri Hoffman, female   DOB: August 22, 1956, 61 y.o.   MRN: [id number]    Subjective:  I acted as a Education administrator for Dr. Carollee Herter.  Sherri Hoffman, Sherri Hoffman   Patient ID: Sherri Hoffman, female    DOB: 02/11/1956, 61 y.o.   MRN: [id number]  Chief Complaint  Patient presents with  . Sinusitis    Sinusitis  This is a new problem. Episode onset: 3 weeks. Associated symptoms include congestion, coughing, sinus pressure and sneezing. Pertinent negatives include no chills, diaphoresis, ear pain, headaches, hoarse voice, neck pain, shortness of breath, sore throat or swollen glands. (Ears feels like she is in a tunnel and itchy) Treatments tried: flonase.    Patient is in today for possible sinus infection.  Patient Care Team: Carollee Herter, Alferd Apa, DO as PCP - General Delsa Bern, MD as Consulting Physician (Obstetrics and Gynecology)   Past Medical History:  Diagnosis Date  . Hypertension   . Skin cancer, basal cell     Past Surgical History:  Procedure Laterality Date  . MYOMECTOMY      Family History  Problem Relation Age of Onset  . Heart disease Mother   . Diabetes Mother   . Hyperlipidemia Mother   . COPD Mother   . Cancer Father        melanoma  . Heart disease Father        chf  . COPD Father   . Skin cancer Unknown        Melanoma  . Coronary artery disease Unknown   . COPD Unknown   . Cancer Unknown        skin, melanoma  . Depression Brother     Social History   Socioeconomic History  . Marital status: Married    Spouse name: Not on file  . Number of children: Not on file  . Years of education: Not on file  . Highest education level: Not on file  Social Needs  . Financial resource strain: Not on file  . Food insecurity - worry: Not on file  . Food insecurity - inability: Not on file  . Transportation needs - medical: Not on file  . Transportation needs - non-medical: Not on file  Occupational History  . 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    Tobacco Use  . Smoking status: Never Smoker  . Smokeless tobacco: Never Used  Substance and Sexual Activity  . Alcohol use: Yes    Alcohol/week: 1.8 oz    Types: 3 Glasses of wine per week  . Drug use: No  . Sexual activity: Yes    Partners: Male    Birth control/protection: Post-menopausal  Other Topics Concern  . Not on file  Social History Narrative  . Not on file    Outpatient Medications Prior to Visit  Medication Sig Dispense Refill  . Calcium Carbonate-Vitamin D (CALCIUM 500/D) 500-125 MG-UNIT TABS Take by mouth.      . cholecalciferol (VITAMIN D) 1000 units tablet Take 1,000 Units by mouth daily.    Marland Kitchen DIOVAN HCT 160-12.5 MG tablet TAKE 1 TABLET BY MOUTH EVERY DAY 90 tablet 0  . estradiol (ESTRACE) 1 MG tablet Take 1 tablet (1 mg total) by mouth daily. 90 tablet 3  . potassium chloride (K-DUR) 10 MEQ tablet TAKE 1 TABLET BY MOUTH EVERY DAY 30 tablet 0  . progesterone (PROMETRIUM) 100 MG capsule Take 1 capsule (100 mg total) by mouth daily. 90 capsule 4  . levocetirizine (XYZAL) 5  MG tablet TAKE 1 TABLET BY MOUTH DAILY AS NEEDED 90 tablet 3  . amoxicillin-clavulanate (AUGMENTIN) 875-125 MG tablet Take 1 tablet by mouth 2 (two) times daily. 20 tablet 0  . cefUROXime (CEFTIN) 500 MG tablet Take 1 tablet (500 mg total) by mouth 2 (two) times daily with a meal. 20 tablet 0  . promethazine-dextromethorphan (PROMETHAZINE-DM) 6.25-15 MG/5ML syrup Take 5 mLs by mouth 4 (four) times daily as needed. 118 mL 0   No facility-administered medications prior to visit.     Allergies  Allergen Reactions  . Ace Inhibitors     REACTION: COUGH  . Erythromycin     REACTION: NAUSEA    Review of Systems  Constitutional: Negative for chills, diaphoresis, fever and malaise/fatigue.  HENT: Positive for congestion, sinus pressure and sneezing. Negative for ear pain, hoarse voice and sore throat.   Eyes: Negative for blurred vision.  Respiratory: Positive for cough. Negative for shortness of  breath.   Cardiovascular: Negative for chest pain, palpitations and leg swelling.  Gastrointestinal: Negative for vomiting.  Musculoskeletal: Negative for back pain and neck pain.  Skin: Negative for rash.  Neurological: Negative for loss of consciousness and headaches.       Objective:    Physical Exam  Constitutional: She is oriented to person, place, and time. She appears well-developed and well-nourished.  HENT:  Right Ear: External ear normal.  Left Ear: External ear normal.  Nose: Right sinus exhibits maxillary sinus tenderness and frontal sinus tenderness. Left sinus exhibits maxillary sinus tenderness and frontal sinus tenderness.  Mouth/Throat: Oropharyngeal exudate and posterior oropharyngeal erythema present.  + PND + errythema  Eyes: Conjunctivae are normal. Right eye exhibits no discharge. Left eye exhibits no discharge.  Cardiovascular: Normal rate, regular rhythm and normal heart sounds.  No murmur heard. Pulmonary/Chest: Effort normal and breath sounds normal. No respiratory distress. She has no wheezes. She has no rales. She exhibits no tenderness.  Musculoskeletal: She exhibits no edema.  Lymphadenopathy:    She has cervical adenopathy.  Neurological: She is alert and oriented to person, place, and time.  Nursing note and vitals reviewed.   BP 120/86 (BP Location: Right Arm, Cuff Size: Normal)   Pulse 77   Temp 98.2 F (36.8 C) (Oral)   Resp 16   Ht 5\' 6"  (1.676 m)   Wt 119 lb 6.4 oz (54.2 kg)   SpO2 98%   BMI 19.27 kg/m  Wt Readings from Last 3 Encounters:  10/15/17 119 lb 6.4 oz (54.2 kg)  01/17/17 121 lb 6.4 oz (55.1 kg)  10/09/16 120 lb 6.4 oz (54.6 kg)   BP Readings from Last 3 Encounters:  10/15/17 120/86  01/17/17 138/80  10/09/16 (!) 150/88     Immunization History  Administered Date(s) Administered  . Influenza Whole 08/09/2010, 08/30/2011, 08/26/2012  . Influenza-Unspecified 08/12/2013  . Td 03/18/2007  . Zoster 04/18/2015      Health Maintenance  Topic Date Due  . HIV Screening  01/13/1970  . TETANUS/TDAP  03/17/2017  . INFLUENZA VACCINE  06/12/2017  . MAMMOGRAM  10/05/2017  . COLONOSCOPY  09/26/2018  . PAP SMEAR  10/05/2018  . Hepatitis C Screening  Completed    Lab Results  Component Value Date   WBC 4.0 07/19/2016   HGB 12.8 07/19/2016   HCT 38.0 07/19/2016   PLT 244.0 07/19/2016   GLUCOSE 97 07/19/2016   CHOL 191 07/19/2016   TRIG 109.0 07/19/2016   HDL 70.80 07/19/2016   LDLCALC 98 07/19/2016  ALT 11 07/19/2016   AST 16 07/19/2016   NA 138 07/19/2016   K 3.8 07/19/2016   CL 103 07/19/2016   CREATININE 0.59 07/19/2016   BUN 18 07/19/2016   CO2 29 07/19/2016   TSH 1.78 07/19/2016   HGBA1C 5.5 01/11/2014   MICROALBUR 0.9 04/18/2015    Lab Results  Component Value Date   TSH 1.78 07/19/2016   Lab Results  Component Value Date   WBC 4.0 07/19/2016   HGB 12.8 07/19/2016   HCT 38.0 07/19/2016   MCV 90.3 07/19/2016   PLT 244.0 07/19/2016   Lab Results  Component Value Date   NA 138 07/19/2016   K 3.8 07/19/2016   CO2 29 07/19/2016   GLUCOSE 97 07/19/2016   BUN 18 07/19/2016   CREATININE 0.59 07/19/2016   BILITOT 0.4 07/19/2016   ALKPHOS 36 (L) 07/19/2016   AST 16 07/19/2016   ALT 11 07/19/2016   PROT 7.4 07/19/2016   ALBUMIN 4.2 07/19/2016   CALCIUM 9.1 07/19/2016   GFR 109.95 07/19/2016   Lab Results  Component Value Date   CHOL 191 07/19/2016   Lab Results  Component Value Date   HDL 70.80 07/19/2016   Lab Results  Component Value Date   LDLCALC 98 07/19/2016   Lab Results  Component Value Date   TRIG 109.0 07/19/2016   Lab Results  Component Value Date   CHOLHDL 3 07/19/2016   Lab Results  Component Value Date   HGBA1C 5.5 01/11/2014         Assessment & Plan:   Problem List Items Addressed This Visit      Unprioritized   Pansinusitis   Relevant Medications   levocetirizine (XYZAL) 5 MG tablet   fluticasone (FLONASE) 50 MCG/ACT nasal  spray   amoxicillin-clavulanate (AUGMENTIN) 875-125 MG tablet   promethazine-dextromethorphan (PROMETHAZINE-DM) 6.25-15 MG/5ML syrup    Other Visit Diagnoses    Seasonal allergic rhinitis due to pollen    -  Primary   Relevant Medications   levocetirizine (XYZAL) 5 MG tablet   fluticasone (FLONASE) 50 MCG/ACT nasal spray   Cough       Relevant Medications   promethazine-dextromethorphan (PROMETHAZINE-DM) 6.25-15 MG/5ML syrup      I have discontinued Olin Hauser Diffley's cefUROXime. I have also changed her levocetirizine. Additionally, I am having her start on fluticasone. Lastly, I am having her maintain her Calcium Carbonate-Vitamin D, progesterone, estradiol, cholecalciferol, DIOVAN HCT, potassium chloride, amoxicillin-clavulanate, and promethazine-dextromethorphan.  Meds ordered this encounter  Medications  . levocetirizine (XYZAL) 5 MG tablet    Sig: Take 1 tablet (5 mg total) by mouth daily as needed.    Dispense:  90 tablet    Refill:  1  . fluticasone (FLONASE) 50 MCG/ACT nasal spray    Sig: Place 2 sprays into both nostrils daily.    Dispense:  16 g    Refill:  6  . amoxicillin-clavulanate (AUGMENTIN) 875-125 MG tablet    Sig: Take 1 tablet by mouth 2 (two) times daily.    Dispense:  20 tablet    Refill:  0  . promethazine-dextromethorphan (PROMETHAZINE-DM) 6.25-15 MG/5ML syrup    Sig: Take 5 mLs by mouth 4 (four) times daily as needed.    Dispense:  118 mL    Refill:  0    CMA served as scribe during this visit. History, Physical and Plan performed by medical provider. Documentation and orders reviewed and attested to.  Ann Held, DO

## 2017-10-15 NOTE — Patient Instructions (Signed)

## 2017-11-18 ENCOUNTER — Other Ambulatory Visit: Payer: Self-pay | Admitting: Family Medicine

## 2017-11-18 ENCOUNTER — Telehealth: Payer: Self-pay | Admitting: Family Medicine

## 2017-11-18 DIAGNOSIS — I10 Essential (primary) hypertension: Secondary | ICD-10-CM

## 2017-11-18 DIAGNOSIS — J324 Chronic pansinusitis: Secondary | ICD-10-CM

## 2017-11-18 MED ORDER — LEVOFLOXACIN 500 MG PO TABS: 500.0000 mg | ORAL_TABLET | Freq: Every day | ORAL | 0 refills | Status: DC

## 2017-11-18 NOTE — Telephone Encounter (Signed)
Patient notified

## 2017-11-18 NOTE — Telephone Encounter (Signed)
levaquin sent to pharmacy

## 2017-11-18 NOTE — Telephone Encounter (Signed)
Copied from [place]. Topic: Quick Communication - See Telephone Encounter >> Nov 18, 2017 10:02 AM Bea Graff, NT wrote: CRM for notification. See Telephone encounter for: Pt calling wanting to see if something can be called in for a sinus infection? She was seen 3 weeks ago for the same thing but it hasn't gone away and is getting worse again. Uses [facility] on [address]  11/18/17.

## 2017-11-26 ENCOUNTER — Ambulatory Visit (INDEPENDENT_AMBULATORY_CARE_PROVIDER_SITE_OTHER): Payer: BLUE CROSS/BLUE SHIELD | Admitting: Family Medicine

## 2017-11-26 ENCOUNTER — Encounter: Payer: Self-pay | Admitting: Family Medicine

## 2017-11-26 VITALS — BP 124/80 | HR 66 | Temp 98.7°F | Ht 66.0 in | Wt 120.0 lb

## 2017-11-26 DIAGNOSIS — Z0000 Encounter for general adult medical examination without abnormal findings: Secondary | ICD-10-CM

## 2017-11-26 DIAGNOSIS — I10 Essential (primary) hypertension: Secondary | ICD-10-CM

## 2017-11-26 LAB — LIPID PANEL
Cholesterol: 185 mg/dL (ref 0–200)
HDL: 70 mg/dL (ref >39.00)
LDL Cholesterol: 82 mg/dL (ref 0–99)
NONHDL: 115.38
Total CHOL/HDL Ratio: 3
Triglycerides: 165 mg/dL — ABNORMAL HIGH (ref 0.0–149.0)
VLDL: 33 mg/dL (ref 0.0–40.0)

## 2017-11-26 LAB — CBC WITH DIFFERENTIAL/PLATELET
BASOS ABS: 0 10*3/uL (ref 0.0–0.1)
Basophils Relative: 0.6 % (ref 0.0–3.0)
Eosinophils Absolute: 0.1 10*3/uL (ref 0.0–0.7)
Eosinophils Relative: 2.5 % (ref 0.0–5.0)
HCT: 39.8 % (ref 36.0–46.0)
Hemoglobin: 13.1 g/dL (ref 12.0–15.0)
LYMPHS PCT: 42.9 % (ref 12.0–46.0)
Lymphs Abs: 2.1 10*3/uL (ref 0.7–4.0)
MCHC: 32.9 g/dL (ref 30.0–36.0)
MCV: 92.9 fl (ref 78.0–100.0)
MONOS PCT: 8.4 % (ref 3.0–12.0)
Monocytes Absolute: 0.4 10*3/uL (ref 0.1–1.0)
NEUTROS PCT: 45.6 % (ref 43.0–77.0)
Neutro Abs: 2.3 10*3/uL (ref 1.4–7.7)
Platelets: 320 10*3/uL (ref 150.0–400.0)
RBC: 4.29 Mil/uL (ref 3.87–5.11)
RDW: 13 % (ref 11.5–15.5)
WBC: 5 10*3/uL (ref 4.0–10.5)

## 2017-11-26 LAB — COMPREHENSIVE METABOLIC PANEL
ALK PHOS: 36 U/L — AB (ref 39–117)
ALT: 9 U/L (ref 0–35)
AST: 15 U/L (ref 0–37)
Albumin: 4.2 g/dL (ref 3.5–5.2)
BILIRUBIN TOTAL: 0.5 mg/dL (ref 0.2–1.2)
BUN: 20 mg/dL (ref 6–23)
CO2: 28 mEq/L (ref 19–32)
CREATININE: 0.58 mg/dL (ref 0.40–1.20)
Calcium: 9.3 mg/dL (ref 8.4–10.5)
Chloride: 103 mEq/L (ref 96–112)
GFR: 111.65 mL/min (ref >60.00)
GLUCOSE: 94 mg/dL (ref 70–99)
Potassium: 4.6 mEq/L (ref 3.5–5.1)
Sodium: 138 mEq/L (ref 135–145)
TOTAL PROTEIN: 7.4 g/dL (ref 6.0–8.3)

## 2017-11-26 LAB — TSH: TSH: 2.53 u[IU]/mL (ref 0.35–4.50)

## 2017-11-26 MED ORDER — POTASSIUM CHLORIDE ER 10 MEQ PO TBCR: 10.0000 meq | EXTENDED_RELEASE_TABLET | Freq: Every day | ORAL | 3 refills | Status: DC

## 2017-11-26 MED ORDER — DIOVAN HCT 160-12.5 MG PO TABS: 1.0000 | ORAL_TABLET | Freq: Every day | ORAL | 1 refills | Status: DC

## 2017-11-26 NOTE — Assessment & Plan Note (Signed)
Well controlled, no changes to meds. Encouraged heart healthy diet such as the DASH diet and exercise as tolerated.  °

## 2017-11-26 NOTE — Patient Instructions (Signed)
Preventive Care 40-64 Years, Female Preventive care refers to lifestyle choices and visits with your health care provider that can promote health and wellness. What does preventive care include?  A yearly physical exam. This is also called an annual well check.  Dental exams once or twice a year.  Routine eye exams. Ask your health care provider how often you should have your eyes checked.  Personal lifestyle choices, including: ? Daily care of your teeth and gums. ? Regular physical activity. ? Eating a healthy diet. ? Avoiding tobacco and drug use. ? Limiting alcohol use. ? Practicing safe sex. ? Taking low-dose aspirin daily starting at age 58. ? Taking vitamin and mineral supplements as recommended by your health care provider. What happens during an annual well check? The services and screenings done by your health care provider during your annual well check will depend on your age, overall health, lifestyle risk factors, and family history of disease. Counseling Your health care provider may ask you questions about your:  Alcohol use.  Tobacco use.  Drug use.  Emotional well-being.  Home and relationship well-being.  Sexual activity.  Eating habits.  Work and work Statistician.  Method of birth control.  Menstrual cycle.  Pregnancy history.  Screening You may have the following tests or measurements:  Height, weight, and BMI.  Blood pressure.  Lipid and cholesterol levels. These may be checked every 5 years, or more frequently if you are over 81 years old.  Skin check.  Lung cancer screening. You may have this screening every year starting at age 78 if you have a 30-pack-year history of smoking and currently smoke or have quit within the past 15 years.  Fecal occult blood test (FOBT) of the stool. You may have this test every year starting at age 65.  Flexible sigmoidoscopy or colonoscopy. You may have a sigmoidoscopy every 5 years or a colonoscopy  every 10 years starting at age 30.  Hepatitis C blood test.  Hepatitis B blood test.  Sexually transmitted disease (STD) testing.  Diabetes screening. This is done by checking your blood sugar (glucose) after you have not eaten for a while (fasting). You may have this done every 1-3 years.  Mammogram. This may be done every 1-2 years. Talk to your health care provider about when you should start having regular mammograms. This may depend on whether you have a family history of breast cancer.  BRCA-related cancer screening. This may be done if you have a family history of breast, ovarian, tubal, or peritoneal cancers.  Pelvic exam and Pap test. This may be done every 3 years starting at age 80. Starting at age 36, this may be done every 5 years if you have a Pap test in combination with an HPV test.  Bone density scan. This is done to screen for osteoporosis. You may have this scan if you are at high risk for osteoporosis.  Discuss your test results, treatment options, and if necessary, the need for more tests with your health care provider. Vaccines Your health care provider may recommend certain vaccines, such as:  Influenza vaccine. This is recommended every year.  Tetanus, diphtheria, and acellular pertussis (Tdap, Td) vaccine. You may need a Td booster every 10 years.  Varicella vaccine. You may need this if you have not been vaccinated.  Zoster vaccine. You may need this after age 5.  Measles, mumps, and rubella (MMR) vaccine. You may need at least one dose of MMR if you were born in  1958 or later. You may also need a second dose.  Pneumococcal 13-valent conjugate (PCV13) vaccine. You may need this if you have certain conditions and were not previously vaccinated.  Pneumococcal polysaccharide (PPSV23) vaccine. You may need one or two doses if you smoke cigarettes or if you have certain conditions.  Meningococcal vaccine. You may need this if you have certain  conditions.  Hepatitis A vaccine. You may need this if you have certain conditions or if you travel or work in places where you may be exposed to hepatitis A.  Hepatitis B vaccine. You may need this if you have certain conditions or if you travel or work in places where you may be exposed to hepatitis B.  Haemophilus influenzae type b (Hib) vaccine. You may need this if you have certain conditions.  Talk to your health care provider about which screenings and vaccines you need and how often you need them. This information is not intended to replace advice given to you by your health care provider. Make sure you discuss any questions you have with your health care provider. Document Released: 11/25/2015 Document Revised: 07/18/2016 Document Reviewed: 08/30/2015 Elsevier Interactive Patient Education  2018 [employer]  

## 2017-11-26 NOTE — Assessment & Plan Note (Addendum)
ghm utd Check labs  See avs  

## 2017-11-26 NOTE — Progress Notes (Signed)
Subjective:  I acted as a Education administrator for [employer], [place]   Patient ID: Sherri Hoffman, female    DOB: [birth year], 62 y.o.   MRN: [id number]  Chief Complaint  Patient presents with  . Annual Exam    HPI  Patient is in today for an annual exam.  No complaints Patient Care Team: Carollee Herter, Alferd Apa, DO as PCP - General Delsa Bern, MD as Consulting Physician ([facility]ne[facility]) Center, [facility]   Past Medical History:  Diagnosis Date  . Hypertension   . Skin cancer, basal cell     Past Surgical History:  Procedure Laterality Date  . MYOMECTOMY      Family History  Problem Relation Age of Onset  . Heart disease Mother   . Diabetes Mother   . Hyperlipidemia Mother   . COPD Mother   . Cancer Father        melanoma  . Heart disease Father        chf  . COPD Father   . Skin cancer Unknown        Melanoma  . Coronary artery disease Unknown   . COPD Unknown   . Cancer Unknown        skin, melanoma  . Depression Brother     Social History   Socioeconomic History  . Marital status: Married    Spouse name: Not on file  . Number of children: Not on file  . Years of education: Not on file  . Highest education level: Not on file  Social Needs  . Financial resource strain: Not on file  . Food insecurity - worry: Not on file  . Food insecurity - inability: Not on file  . Transportation needs - medical: Not on file  . Transportation needs - non-medical: Not on file  Occupational History  . 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  Tobacco Use  . Smoking status: Never Smoker  . Smokeless tobacco: Never Used  Substance and Sexual Activity  . Alcohol use: Yes    Alcohol/week: 1.8 oz    Types: 3 Glasses of wine per week  . Drug use: No  . Sexual activity: Yes    Partners: Male    Birth control/protection: Post-menopausal  Other Topics Concern  . Not on file  Social History Narrative  . Not on file    Outpatient Medications Prior  to Visit  Medication Sig Dispense Refill  . Calcium Carbonate-Vitamin D (CALCIUM 500/D) 500-125 MG-UNIT TABS Take by mouth.      . cholecalciferol (VITAMIN D) 1000 units tablet Take 1,000 Units by mouth daily.    Marland Kitchen estradiol (ESTRACE) 1 MG tablet Take 1 tablet (1 mg total) by mouth daily. 90 tablet 3  . fluticasone (FLONASE) 50 MCG/ACT nasal spray Place 2 sprays into both nostrils daily. 16 g 6  . levocetirizine (XYZAL) 5 MG tablet Take 1 tablet (5 mg total) by mouth daily as needed. 90 tablet 1  . levofloxacin (LEVAQUIN) 500 MG tablet Take 1 tablet (500 mg total) by mouth daily. 7 tablet 0  . magnesium 30 MG tablet Take 30 mg by mouth 2 (two) times daily.    . progesterone (PROMETRIUM) 100 MG capsule Take 1 capsule (100 mg total) by mouth daily. 90 capsule 4  . promethazine-dextromethorphan (PROMETHAZINE-DM) 6.25-15 MG/5ML syrup Take 5 mLs by mouth 4 (four) times daily as needed. 118 mL 0  . amoxicillin-clavulanate (AUGMENTIN) 875-125 MG tablet Take 1 tablet by mouth  2 (two) times daily. 20 tablet 0  . DIOVAN HCT 160-12.5 MG tablet TAKE 1 TABLET BY MOUTH EVERY DAY 90 tablet 0  . potassium chloride (K-DUR) 10 MEQ tablet TAKE 1 TABLET BY MOUTH EVERY DAY 30 tablet 0   No facility-administered medications prior to visit.     Allergies  Allergen Reactions  . Ace Inhibitors     REACTION: COUGH  . Erythromycin     REACTION: NAUSEA    Review of Systems  Constitutional: Negative for chills, fever and malaise/fatigue.  HENT: Negative for congestion and hearing loss.   Eyes: Negative for blurred vision and discharge.  Respiratory: Negative for cough, sputum production and shortness of breath.   Cardiovascular: Negative for chest pain, palpitations and leg swelling.  Gastrointestinal: Negative for abdominal pain, blood in stool, constipation, diarrhea, heartburn, nausea and vomiting.  Genitourinary: Negative for dysuria, frequency, hematuria and urgency.  Musculoskeletal: Negative for back  pain, falls and myalgias.  Skin: Negative for rash.  Neurological: Negative for dizziness, sensory change, loss of consciousness, weakness and headaches.  Endo/Heme/Allergies: Negative for environmental allergies. Does not bruise/bleed easily.  Psychiatric/Behavioral: Negative for depression and suicidal ideas. The patient is not nervous/anxious and does not have insomnia.        Objective:    Physical Exam  Constitutional: She is oriented to person, place, and time. She appears well-developed and well-nourished. No distress.  HENT:  Head: Normocephalic and atraumatic.  Right Ear: External ear normal.  Left Ear: External ear normal.  Nose: Nose normal.  Mouth/Throat: Oropharynx is clear and moist.  Eyes: Conjunctivae and EOM are normal. Pupils are equal, round, and reactive to light. Right eye exhibits no discharge. Left eye exhibits no discharge.  Neck: Normal range of motion. Neck supple. No JVD present. No thyromegaly present.  Cardiovascular: Normal rate, regular rhythm, normal heart sounds and intact distal pulses.  No murmur heard. Pulmonary/Chest: Effort normal and breath sounds normal. No respiratory distress. She has no wheezes. She has no rales. She exhibits no tenderness.  Abdominal: Soft. Bowel sounds are normal. She exhibits no distension and no mass. There is no tenderness. There is no rebound and no guarding.  Musculoskeletal: Normal range of motion. She exhibits no edema or tenderness.  Lymphadenopathy:    She has no cervical adenopathy.  Neurological: She is alert and oriented to person, place, and time. She has normal reflexes. No cranial nerve deficit.  Skin: Skin is warm and dry. No rash noted. She is not diaphoretic. No erythema.  Psychiatric: She has a normal mood and affect. Her behavior is normal. Judgment and thought content normal.  Nursing note and vitals reviewed.   BP 124/80   Pulse 66   Temp 98.7 F (37.1 C) (Oral)   Ht 5\' 6"  (1.676 m)   Wt 120 lb  (54.4 kg)   SpO2 98%   BMI 19.37 kg/m  Wt Readings from Last 3 Encounters:  11/26/17 120 lb (54.4 kg)  10/15/17 119 lb 6.4 oz (54.2 kg)  01/17/17 121 lb 6.4 oz (55.1 kg)   BP Readings from Last 3 Encounters:  11/26/17 124/80  10/15/17 120/86  01/17/17 138/80     Immunization History  Administered Date(s) Administered  . Influenza Whole 08/09/2010, 08/30/2011, 08/26/2012  . Influenza-Unspecified 08/12/2013  . Td 03/18/2007  . Zoster 04/18/2015    Health Maintenance  Topic Date Due  . HIV Screening  01/13/1970  . TETANUS/TDAP  03/17/2017  . INFLUENZA VACCINE  08/13/2019 (Originally 06/12/2017)  .  COLONOSCOPY  09/26/2018  . PAP SMEAR  10/05/2018  . MAMMOGRAM  10/05/2019  . Hepatitis C Screening  Completed    Lab Results  Component Value Date   WBC 4.0 07/19/2016   HGB 12.8 07/19/2016   HCT 38.0 07/19/2016   PLT 244.0 07/19/2016   GLUCOSE 97 07/19/2016   CHOL 191 07/19/2016   TRIG 109.0 07/19/2016   HDL 70.80 07/19/2016   LDLCALC 98 07/19/2016   ALT 11 07/19/2016   AST 16 07/19/2016   NA 138 07/19/2016   K 3.8 07/19/2016   CL 103 07/19/2016   CREATININE 0.59 07/19/2016   BUN 18 07/19/2016   CO2 29 07/19/2016   TSH 1.78 07/19/2016   HGBA1C 5.5 01/11/2014   MICROALBUR 0.9 04/18/2015    Lab Results  Component Value Date   TSH 1.78 07/19/2016   Lab Results  Component Value Date   WBC 4.0 07/19/2016   HGB 12.8 07/19/2016   HCT 38.0 07/19/2016   MCV 90.3 07/19/2016   PLT 244.0 07/19/2016   Lab Results  Component Value Date   NA 138 07/19/2016   K 3.8 07/19/2016   CO2 29 07/19/2016   GLUCOSE 97 07/19/2016   BUN 18 07/19/2016   CREATININE 0.59 07/19/2016   BILITOT 0.4 07/19/2016   ALKPHOS 36 (L) 07/19/2016   AST 16 07/19/2016   ALT 11 07/19/2016   PROT 7.4 07/19/2016   ALBUMIN 4.2 07/19/2016   CALCIUM 9.1 07/19/2016   GFR 109.95 07/19/2016   Lab Results  Component Value Date   CHOL 191 07/19/2016   Lab Results  Component Value Date   HDL  70.80 07/19/2016   Lab Results  Component Value Date   LDLCALC 98 07/19/2016   Lab Results  Component Value Date   TRIG 109.0 07/19/2016   Lab Results  Component Value Date   CHOLHDL 3 07/19/2016   Lab Results  Component Value Date   HGBA1C 5.5 01/11/2014         Assessment & Plan:   Problem List Items Addressed This Visit      Unprioritized   Essential hypertension    Well controlled, no changes to meds. Encouraged heart healthy diet such as the DASH diet and exercise as tolerated.       Relevant Medications   DIOVAN HCT 160-12.5 MG tablet   potassium chloride (K-DUR) 10 MEQ tablet   Other Relevant Orders   Lipid panel   CBC with Differential/Platelet   Comprehensive metabolic panel   TSH   POCT Urinalysis Dipstick (Automated)   Preventative health care - Primary    ghm utd  Check labs  See avs       Relevant Orders   Lipid panel   CBC with Differential/Platelet   Comprehensive metabolic panel   TSH   POCT Urinalysis Dipstick (Automated)      I have discontinued Olin Hauser Pandolfi's amoxicillin-clavulanate. I have also changed her DIOVAN HCT and potassium chloride. Additionally, I am having her maintain her Calcium Carbonate-Vitamin D, progesterone, estradiol, cholecalciferol, levocetirizine, fluticasone, promethazine-dextromethorphan, levofloxacin, and magnesium.  Meds ordered this encounter  Medications  . DIOVAN HCT 160-12.5 MG tablet    Sig: Take 1 tablet by mouth daily.    Dispense:  90 tablet    Refill:  1  . potassium chloride (K-DUR) 10 MEQ tablet    Sig: Take 1 tablet (10 mEq total) by mouth daily.    Dispense:  90 tablet    Refill:  3  CMA served as Education administrator during this visit. History, Physical and Plan performed by medical provider. Documentation and orders reviewed and attested to.  Ann Held, DO

## 2017-11-27 ENCOUNTER — Other Ambulatory Visit: Payer: Self-pay

## 2017-11-27 DIAGNOSIS — Z0000 Encounter for general adult medical examination without abnormal findings: Secondary | ICD-10-CM

## 2017-11-27 DIAGNOSIS — I10 Essential (primary) hypertension: Secondary | ICD-10-CM

## 2017-12-27 ENCOUNTER — Encounter: Payer: Self-pay | Admitting: Family Medicine

## 2017-12-27 ENCOUNTER — Ambulatory Visit: Payer: BLUE CROSS/BLUE SHIELD | Admitting: Family Medicine

## 2017-12-27 VITALS — BP 110/82 | HR 68 | Temp 97.9°F | Ht 66.0 in | Wt 121.5 lb

## 2017-12-27 DIAGNOSIS — R05 Cough: Secondary | ICD-10-CM

## 2017-12-27 DIAGNOSIS — J0100 Acute maxillary sinusitis, unspecified: Secondary | ICD-10-CM

## 2017-12-27 DIAGNOSIS — R059 Cough, unspecified: Secondary | ICD-10-CM

## 2017-12-27 MED ORDER — AMOXICILLIN-POT CLAVULANATE 875-125 MG PO TABS: 1.0000 | ORAL_TABLET | Freq: Two times a day (BID) | ORAL | 0 refills | Status: DC

## 2017-12-27 MED ORDER — PROMETHAZINE-DM 6.25-15 MG/5ML PO SYRP: 5.0000 mL | ORAL_SOLUTION | Freq: Four times a day (QID) | ORAL | 0 refills | Status: DC | PRN

## 2017-12-27 NOTE — Progress Notes (Signed)
Chief Complaint  Patient presents with  . Sinusitis    Sherri Hoffman here for URI complaints.  Duration: 5 days  Associated symptoms: sinus congestion, sinus pain, rhinorrhea, ear fullness and cough Denies: itchy watery eyes, ear pain, ear drainage, sore throat, wheezing, shortness of breath, myalgia and fevers Treatment to date: Mucinex, Claritin, INCS Sick contacts: Yes - airplane  ROS:  Const: Denies fevers HEENT: As noted in HPI Lungs: No SOB  Past Medical History:  Diagnosis Date  . Hypertension   . Skin cancer, basal cell    Family History  Problem Relation Age of Onset  . Heart disease Mother   . Diabetes Mother   . Hyperlipidemia Mother   . COPD Mother   . Cancer Father        melanoma  . Heart disease Father        chf  . COPD Father   . Skin cancer Unknown        Melanoma  . Coronary artery disease Unknown   . COPD Unknown   . Cancer Unknown        skin, melanoma  . Depression Brother     BP 110/82 (BP Location: Left Arm, Patient Position: Sitting, Cuff Size: Normal)   Pulse 68   Temp 97.9 F (36.6 C) (Oral)   Ht 5\' 6"  (1.676 m)   Wt 121 lb 8 oz (55.1 kg)   SpO2 97%   BMI 19.61 kg/m  General: Awake, alert, appears stated age HEENT: AT, [place], ears patent b/l and TM's neg, +TTP over max sinuses b/l, nares patent w/o discharge, pharynx pink and without exudates, MMM Neck: No masses or asymmetry Heart: RRR Lungs: CTAB, no accessory muscle use Psych: Age appropriate judgment and insight, normal mood and affect  Acute maxillary sinusitis, recurrence not specified - Plan: amoxicillin-clavulanate (AUGMENTIN) 875-125 MG tablet  Cough - Plan: promethazine-dextromethorphan (PROMETHAZINE-DM) 6.25-15 MG/5ML syrup  Orders as above. Pocket rx instructions given. Tylenol/ibuprofen for sinus pain. Continue to push fluids, practice good hand hygiene, cover mouth when coughing. F/u prn. If starting to experience fevers, shaking, or shortness of breath, seek  immediate care. Pt voiced understanding and agreement to the plan.  [place], DO 12/27/17 9:53 AM

## 2017-12-27 NOTE — Patient Instructions (Addendum)
Most sinus infections are viral in etiology and antibiotics will not be helpful. That being said, if you start having worsening symptoms over 3 days, you are worsening by day 10 or not improving by day 14, go ahead and take it. You are on Day 5 as of now.   Continue to push fluids, practice good hand hygiene, and cover your mouth if you cough.  If you start having fevers, shaking or shortness of breath, seek immediate care.  Ibuprofen 400-600 mg (2-3 over the counter strength tabs) every 6 hours as needed for pain.  OK to take Tylenol 1000 mg (2 extra strength tabs) or 975 mg (3 regular strength tabs) every 6 hours as needed.  Let us know if you need anything.

## 2017-12-27 NOTE — Progress Notes (Signed)
Pre visit review using our clinic review tool, if applicable. No additional management support is needed unless otherwise documented below in the visit note. 

## 2018-03-13 ENCOUNTER — Other Ambulatory Visit: Payer: Self-pay | Admitting: Family Medicine

## 2018-03-13 DIAGNOSIS — J301 Allergic rhinitis due to pollen: Secondary | ICD-10-CM

## 2018-05-11 ENCOUNTER — Other Ambulatory Visit: Payer: Self-pay | Admitting: Family Medicine

## 2018-05-11 DIAGNOSIS — I10 Essential (primary) hypertension: Secondary | ICD-10-CM

## 2018-06-01 ENCOUNTER — Other Ambulatory Visit: Payer: Self-pay | Admitting: Family Medicine

## 2018-06-01 DIAGNOSIS — J301 Allergic rhinitis due to pollen: Secondary | ICD-10-CM

## 2018-06-24 DIAGNOSIS — K219 Gastro-esophageal reflux disease without esophagitis: Secondary | ICD-10-CM | POA: Diagnosis not present

## 2018-06-24 DIAGNOSIS — Z1211 Encounter for screening for malignant neoplasm of colon: Secondary | ICD-10-CM | POA: Diagnosis not present

## 2018-06-24 DIAGNOSIS — K5730 Diverticulosis of large intestine without perforation or abscess without bleeding: Secondary | ICD-10-CM | POA: Diagnosis not present

## 2018-08-07 DIAGNOSIS — L821 Other seborrheic keratosis: Secondary | ICD-10-CM | POA: Diagnosis not present

## 2018-08-07 DIAGNOSIS — L814 Other melanin hyperpigmentation: Secondary | ICD-10-CM | POA: Diagnosis not present

## 2018-08-07 DIAGNOSIS — D1801 Hemangioma of skin and subcutaneous tissue: Secondary | ICD-10-CM | POA: Diagnosis not present

## 2018-08-07 DIAGNOSIS — D225 Melanocytic nevi of trunk: Secondary | ICD-10-CM | POA: Diagnosis not present

## 2018-08-11 DIAGNOSIS — K5730 Diverticulosis of large intestine without perforation or abscess without bleeding: Secondary | ICD-10-CM | POA: Diagnosis not present

## 2018-08-11 DIAGNOSIS — Z1211 Encounter for screening for malignant neoplasm of colon: Secondary | ICD-10-CM | POA: Diagnosis not present

## 2018-08-11 LAB — HM COLONOSCOPY

## 2018-08-21 ENCOUNTER — Other Ambulatory Visit: Payer: Self-pay | Admitting: Family Medicine

## 2018-08-21 DIAGNOSIS — I10 Essential (primary) hypertension: Secondary | ICD-10-CM

## 2018-08-31 ENCOUNTER — Other Ambulatory Visit: Payer: Self-pay | Admitting: Family Medicine

## 2018-08-31 DIAGNOSIS — I10 Essential (primary) hypertension: Secondary | ICD-10-CM

## 2018-09-03 ENCOUNTER — Other Ambulatory Visit: Payer: Self-pay | Admitting: Family Medicine

## 2018-09-03 DIAGNOSIS — I10 Essential (primary) hypertension: Secondary | ICD-10-CM

## 2018-09-04 ENCOUNTER — Encounter: Payer: Self-pay | Admitting: Family Medicine

## 2018-09-04 ENCOUNTER — Ambulatory Visit: Payer: BLUE CROSS/BLUE SHIELD | Admitting: Family Medicine

## 2018-09-04 VITALS — BP 126/82 | HR 70 | Temp 98.0°F | Resp 16 | Ht 66.0 in | Wt 119.8 lb

## 2018-09-04 DIAGNOSIS — J0140 Acute pansinusitis, unspecified: Secondary | ICD-10-CM | POA: Diagnosis not present

## 2018-09-04 MED ORDER — AMOXICILLIN-POT CLAVULANATE 875-125 MG PO TABS: 1.0000 | ORAL_TABLET | Freq: Two times a day (BID) | ORAL | 0 refills | Status: DC

## 2018-09-04 MED ORDER — PROMETHAZINE-DM 6.25-15 MG/5ML PO SYRP: 5.0000 mL | ORAL_SOLUTION | Freq: Four times a day (QID) | ORAL | 0 refills | Status: DC | PRN

## 2018-09-04 NOTE — Patient Instructions (Signed)

## 2018-09-04 NOTE — Progress Notes (Signed)
Patient ID: Sherri Hoffman, female    DOB: [birth year]  Age: 62 y.o. MRN: [id number]    Subjective:  Subjective  HPI Hajer Dwyer presents for sinus infection x few weeks.  + sinus pressure   Review of Systems  Constitutional: Positive for chills. Negative for fever.  HENT: Positive for congestion, postnasal drip, rhinorrhea and sinus pressure.   Respiratory: Positive for cough. Negative for chest tightness, shortness of breath and wheezing.   Cardiovascular: Negative for chest pain, palpitations and leg swelling.  Allergic/Immunologic: Negative for environmental allergies.    History Past Medical History:  Diagnosis Date  . Hypertension   . Skin cancer, basal cell     She has a past surgical history that includes Myomectomy.   Her family history includes COPD in her father, mother, and unknown relative; Cancer in her father and unknown relative; Coronary artery disease in her unknown relative; Depression in her brother; Diabetes in her mother; Heart disease in her father and mother; Hyperlipidemia in her mother; Skin cancer in her unknown relative.She reports that she has never smoked. She has never used smokeless tobacco. She reports that she drinks about 3.0 standard drinks of alcohol per week. She reports that she does not use drugs.  Current Outpatient Medications on File Prior to Visit  Medication Sig Dispense Refill  . Calcium Carbonate-Vitamin D (CALCIUM 500/D) 500-125 MG-UNIT TABS Take by mouth.      . cholecalciferol (VITAMIN D) 1000 units tablet Take 1,000 Units by mouth daily.    Marland Kitchen DIOVAN HCT 160-12.5 MG tablet TAKE 1 TABLET BY MOUTH DAILY 90 tablet 0  . estradiol (ESTRACE) 1 MG tablet Take 1 tablet (1 mg total) by mouth daily. 90 tablet 3  . fluticasone (FLONASE) 50 MCG/ACT nasal spray Place 2 sprays into both nostrils daily. 16 g 6  . levocetirizine (XYZAL) 5 MG tablet TAKE 1 TABLET(5 MG) BY MOUTH DAILY AS NEEDED 90 tablet 1  . magnesium 30 MG tablet Take 30 mg by  mouth 2 (two) times daily.    . potassium chloride (K-DUR) 10 MEQ tablet TAKE 1 TABLET(10 MEQ) BY MOUTH DAILY 90 tablet 0  . progesterone (PROMETRIUM) 100 MG capsule Take 1 capsule (100 mg total) by mouth daily. 90 capsule 4  . [DISCONTINUED] triamcinolone (NASACORT AQ) 55 MCG/ACT nasal inhaler Place 2 sprays into the nose daily. 1 Inhaler 12   No current facility-administered medications on file prior to visit.      Objective:  Objective  Physical Exam  Constitutional: She is oriented to person, place, and time. She appears well-developed and well-nourished.  HENT:  Right Ear: External ear normal.  Left Ear: External ear normal.  Nose: Right sinus exhibits maxillary sinus tenderness and frontal sinus tenderness. Left sinus exhibits maxillary sinus tenderness and frontal sinus tenderness.  + PND + errythema  Eyes: Conjunctivae are normal. Right eye exhibits no discharge. Left eye exhibits no discharge.  Cardiovascular: Normal rate, regular rhythm and normal heart sounds.  No murmur heard. Pulmonary/Chest: Effort normal and breath sounds normal. No respiratory distress. She has no wheezes. She has no rales. She exhibits no tenderness.  Musculoskeletal: She exhibits no edema.  Lymphadenopathy:    She has cervical adenopathy.  Neurological: She is alert and oriented to person, place, and time.  Nursing note and vitals reviewed.  BP 126/82 (BP Location: Right Arm, Cuff Size: Normal)   Pulse 70   Temp 98 F (36.7 C) (Oral)   Resp 16   Ht 5\' 6"  (1.676  m)   Wt 119 lb 12.8 oz (54.3 kg)   SpO2 98%   BMI 19.34 kg/m  Wt Readings from Last 3 Encounters:  09/04/18 119 lb 12.8 oz (54.3 kg)  12/27/17 121 lb 8 oz (55.1 kg)  11/26/17 120 lb (54.4 kg)     Lab Results  Component Value Date   WBC 5.0 11/26/2017   HGB 13.1 11/26/2017   HCT 39.8 11/26/2017   PLT 320.0 11/26/2017   GLUCOSE 94 11/26/2017   CHOL 185 11/26/2017   TRIG 165.0 (H) 11/26/2017   HDL 70.00 11/26/2017   LDLCALC  82 11/26/2017   ALT 9 11/26/2017   AST 15 11/26/2017   NA 138 11/26/2017   K 4.6 11/26/2017   CL 103 11/26/2017   CREATININE 0.58 11/26/2017   BUN 20 11/26/2017   CO2 28 11/26/2017   TSH 2.53 11/26/2017   HGBA1C 5.5 01/11/2014   MICROALBUR 0.9 04/18/2015    No results found.   Assessment & Plan:  Plan  I have discontinued Shari Heritage promethazine-dextromethorphan and amoxicillin-clavulanate. I am also having her start on amoxicillin-clavulanate and promethazine-dextromethorphan. Additionally, I am having her maintain her Calcium Carbonate-Vitamin D, progesterone, estradiol, cholecalciferol, fluticasone, magnesium, levocetirizine, DIOVAN HCT, and potassium chloride.  Meds ordered this encounter  Medications  . amoxicillin-clavulanate (AUGMENTIN) 875-125 MG tablet    Sig: Take 1 tablet by mouth 2 (two) times daily.    Dispense:  20 tablet    Refill:  0  . promethazine-dextromethorphan (PROMETHAZINE-DM) 6.25-15 MG/5ML syrup    Sig: Take 5 mLs by mouth 4 (four) times daily as needed.    Dispense:  118 mL    Refill:  0    Problem List Items Addressed This Visit      Unprioritized   Pansinusitis - Primary   Relevant Medications   amoxicillin-clavulanate (AUGMENTIN) 875-125 MG tablet   promethazine-dextromethorphan (PROMETHAZINE-DM) 6.25-15 MG/5ML syrup    con't nasal sprays and antihistamine rto prn   Follow-up: Return if symptoms worsen or fail to improve.  Ann Held, DO

## 2018-09-09 ENCOUNTER — Encounter: Payer: Self-pay | Admitting: Family Medicine

## 2018-10-01 DIAGNOSIS — Z79890 Hormone replacement therapy (postmenopausal): Secondary | ICD-10-CM | POA: Diagnosis not present

## 2018-10-01 DIAGNOSIS — Z1231 Encounter for screening mammogram for malignant neoplasm of breast: Secondary | ICD-10-CM | POA: Diagnosis not present

## 2018-10-01 DIAGNOSIS — Z6820 Body mass index (BMI) 20.0-20.9, adult: Secondary | ICD-10-CM | POA: Diagnosis not present

## 2018-10-01 DIAGNOSIS — Z01419 Encounter for gynecological examination (general) (routine) without abnormal findings: Secondary | ICD-10-CM | POA: Diagnosis not present

## 2018-11-02 ENCOUNTER — Other Ambulatory Visit: Payer: Self-pay | Admitting: Family Medicine

## 2018-11-02 DIAGNOSIS — J301 Allergic rhinitis due to pollen: Secondary | ICD-10-CM

## 2018-11-18 ENCOUNTER — Other Ambulatory Visit: Payer: Self-pay | Admitting: Family Medicine

## 2018-11-18 DIAGNOSIS — I10 Essential (primary) hypertension: Secondary | ICD-10-CM

## 2018-11-28 ENCOUNTER — Other Ambulatory Visit: Payer: Self-pay | Admitting: Family Medicine

## 2018-11-28 DIAGNOSIS — J301 Allergic rhinitis due to pollen: Secondary | ICD-10-CM

## 2018-12-03 ENCOUNTER — Other Ambulatory Visit: Payer: Self-pay | Admitting: Family Medicine

## 2018-12-03 DIAGNOSIS — I10 Essential (primary) hypertension: Secondary | ICD-10-CM

## 2018-12-12 ENCOUNTER — Ambulatory Visit: Payer: Self-pay

## 2018-12-12 ENCOUNTER — Other Ambulatory Visit: Payer: Self-pay | Admitting: Family Medicine

## 2018-12-12 DIAGNOSIS — J324 Chronic pansinusitis: Secondary | ICD-10-CM

## 2018-12-12 MED ORDER — DOXYCYCLINE HYCLATE 100 MG PO TABS: 100.0000 mg | ORAL_TABLET | Freq: Two times a day (BID) | ORAL | 0 refills | Status: DC

## 2018-12-12 NOTE — Telephone Encounter (Signed)
Patient called and she says she is heading into the funeral and asks if Dr. Carollee Herter would call her in something for a sinus infection. She says she will not be able to make [facility] [place]. I advised I will send to her and someone will call with her recommendation. I was unable to triage the patient at this time due to her walking into the funeral when I called.   Summary: sinus pressure- antibiotic request    Patient called with complaints of sinus pressure and congestion. Patient states that she gets sinus infections often and would like to know if an antibiotic can be sent into pharmacy on file w/o office visit as she has a funeral to attend today. Advised patient that she likely needs appointment, no availability with PCP today.

## 2018-12-12 NOTE — Telephone Encounter (Signed)
I sent in doxy due to pt having funeral today Will need ov if no better

## 2018-12-15 DIAGNOSIS — M7742 Metatarsalgia, left foot: Secondary | ICD-10-CM | POA: Diagnosis not present

## 2018-12-15 DIAGNOSIS — G5762 Lesion of plantar nerve, left lower limb: Secondary | ICD-10-CM | POA: Diagnosis not present

## 2018-12-15 NOTE — Telephone Encounter (Signed)
Patient did get medication and advised her that she will need an ov if no better.

## 2018-12-25 ENCOUNTER — Encounter: Payer: Self-pay | Admitting: Medical

## 2018-12-25 ENCOUNTER — Ambulatory Visit: Payer: BLUE CROSS/BLUE SHIELD | Admitting: Medical

## 2018-12-25 VITALS — BP 146/75 | HR 76 | Temp 98.5°F | Resp 16 | Ht 66.0 in | Wt 122.2 lb

## 2018-12-25 DIAGNOSIS — R0982 Postnasal drip: Secondary | ICD-10-CM | POA: Diagnosis not present

## 2018-12-25 DIAGNOSIS — R0981 Nasal congestion: Secondary | ICD-10-CM | POA: Diagnosis not present

## 2018-12-25 DIAGNOSIS — J3489 Other specified disorders of nose and nasal sinuses: Secondary | ICD-10-CM | POA: Diagnosis not present

## 2018-12-25 DIAGNOSIS — J0100 Acute maxillary sinusitis, unspecified: Secondary | ICD-10-CM | POA: Diagnosis not present

## 2018-12-25 MED ORDER — FLUCONAZOLE 150 MG PO TABS: 150.0000 mg | ORAL_TABLET | Freq: Once | ORAL | 0 refills | Status: AC

## 2018-12-25 MED ORDER — AZELASTINE HCL 0.1 % NA SOLN: 2.0000 | Freq: Two times a day (BID) | NASAL | 12 refills | Status: DC

## 2018-12-25 MED ORDER — HYDROCODONE-HOMATROPINE 5-1.5 MG/5ML PO SYRP: 5.0000 mL | ORAL_SOLUTION | Freq: Four times a day (QID) | ORAL | 0 refills | Status: DC | PRN

## 2018-12-25 MED ORDER — CLARITHROMYCIN ER 500 MG PO TB24: 1000.0000 mg | ORAL_TABLET | Freq: Every day | ORAL | 0 refills | Status: DC

## 2018-12-25 NOTE — Patient Instructions (Addendum)
You do appear to have probable sinus infection not resolving with doxycycline.  Recommend discontinuing doxycycline and starting Biaxin XL.  You were about to travel to the [place] on Saturday so you could use Afrin preflight to help with nasal congestion and potential ear discomfort.  Would recommend that you continue Flonase and add Astelin.  We discussed potentially giving you Depo-Medrol steroid injection or starting taper prednisone today.  You do not want to go that route presently.  But will make taper dose of prednisone available if [place]  Prescribing Hycodan for moderate to severe cough.  Side effect Rx advisement given.  I just want to use the medication at night.  Diflucan prescription given.  In the event of yeast infection you can use this.  Recommend using probiotic tabs while antibiotic or eat probiotic rich foods.  Follow-up in 10 days or as needed.

## 2018-12-25 NOTE — Progress Notes (Signed)
Subjective:    Patient ID: Sherri Hoffman, female    DOB: 01/26/1956, 63 y.o.   MRN: [id number]  HPI  Pt in for some nasal congestion for about one week. Also some sinus pressure. Pt called around 12/12/2018 and got doxy called in. She waited about 7 days ago and started doxy. She still has some sinus pressure. Some pnd. Pt has been sneezing a lot. Pt does report some sinus issues always in fall and end of winter/possible early spring.  Pt is using flonase.  Pt did have cough that kept her up last night.  Pt reluctant to take oral prednisone or steroid injection due to side effect fluid retention.   Review of Systems  Constitutional: Negative for chills, fatigue and fever.  HENT: Positive for congestion and sinus pressure. Negative for ear pain, hearing loss, postnasal drip, sinus pain, sore throat and trouble swallowing.   Respiratory: Positive for cough. Negative for chest tightness, shortness of breath and wheezing.   Cardiovascular: Negative for chest pain and palpitations.  Gastrointestinal: Negative for abdominal pain.  Genitourinary: Negative for dysuria.  Musculoskeletal: Negative for back pain.  Skin: Negative for rash.  Neurological: Negative for dizziness, speech difficulty, weakness, light-headedness and headaches.  Hematological: Negative for adenopathy. Does not bruise/bleed easily.  Psychiatric/Behavioral: Negative for behavioral problems and confusion.    Past Medical History:  Diagnosis Date  . Hypertension   . Skin cancer, basal cell      Social History   Socioeconomic History  . Marital status: Married    Spouse name: Not on file  . Number of children: Not on file  . Years of education: Not on file  . Highest education level: Not on file  Occupational History  . 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  Social Needs  . Financial resource strain: Not on file  . Food insecurity:    Worry: Not on file    Inability: Not on file  . Transportation  needs:    Medical: Not on file    Non-medical: Not on file  Tobacco Use  . Smoking status: Never Smoker  . Smokeless tobacco: Never Used  Substance and Sexual Activity  . Alcohol use: Yes    Alcohol/week: 3.0 standard drinks    Types: 3 Glasses of wine per week  . Drug use: No  . Sexual activity: Yes    Partners: Male    Birth control/protection: Post-menopausal  Lifestyle  . Physical activity:    Days per week: Not on file    Minutes per session: Not on file  . Stress: Not on file  Relationships  . Social connections:    Talks on phone: Not on file    Gets together: Not on file    Attends religious service: Not on file    Active member of club or organization: Not on file    Attends meetings of clubs or organizations: Not on file    Relationship status: Not on file  . Intimate partner violence:    Fear of current or ex partner: Not on file    Emotionally abused: Not on file    Physically abused: Not on file    Forced sexual activity: Not on file  Other Topics Concern  . Not on file  Social History Narrative  . Not on file    Past Surgical History:  Procedure Laterality Date  . MYOMECTOMY      Family History  Problem Relation Age of Onset  .  Heart disease Mother   . Diabetes Mother   . Hyperlipidemia Mother   . COPD Mother   . Cancer Father        melanoma  . Heart disease Father        chf  . COPD Father   . Skin cancer Unknown        Melanoma  . Coronary artery disease Unknown   . COPD Unknown   . Cancer Unknown        skin, melanoma  . Depression Brother     Allergies  Allergen Reactions  . Ace Inhibitors     REACTION: COUGH  . Erythromycin     REACTION: NAUSEA    Current Outpatient Medications on File Prior to Visit  Medication Sig Dispense Refill  . Calcium Carbonate-Vitamin D (CALCIUM 500/D) 500-125 MG-UNIT TABS Take by mouth.      . cholecalciferol (VITAMIN D) 1000 units tablet Take 1,000 Units by mouth daily.    Marland Kitchen DIOVAN HCT 160-12.5  MG tablet TAKE 1 TABLET BY MOUTH DAILY 90 tablet 1  . doxycycline (VIBRA-TABS) 100 MG tablet Take 1 tablet (100 mg total) by mouth 2 (two) times daily. 20 tablet 0  . estradiol (ESTRACE) 1 MG tablet Take 1 tablet (1 mg total) by mouth daily. 90 tablet 3  . fluticasone (FLONASE) 50 MCG/ACT nasal spray SHAKE LIQUID AND USE 2 SPRAYS IN EACH NOSTRIL DAILY 16 g 6  . KLOR-CON M10 10 MEQ tablet TAKE 1 TABLET(10 MEQ) BY MOUTH DAILY 90 tablet 1  . levocetirizine (XYZAL) 5 MG tablet TAKE 1 TABLET(5 MG) BY MOUTH DAILY AS NEEDED 90 tablet 1  . magnesium 30 MG tablet Take 30 mg by mouth 2 (two) times daily.    . progesterone (PROMETRIUM) 100 MG capsule Take 1 capsule (100 mg total) by mouth daily. 90 capsule 4  . promethazine-dextromethorphan (PROMETHAZINE-DM) 6.25-15 MG/5ML syrup Take 5 mLs by mouth 4 (four) times daily as needed. 118 mL 0  . [DISCONTINUED] triamcinolone (NASACORT AQ) 55 MCG/ACT nasal inhaler Place 2 sprays into the nose daily. 1 Inhaler 12   No current facility-administered medications on file prior to visit.     BP (!) 146/75   Pulse 76   Temp 98.5 F (36.9 C) (Oral)   Resp 16   Ht 5\' 6"  (1.676 m)   Wt 122 lb 3.2 oz (55.4 kg)   SpO2 100%   BMI 19.72 kg/m       Objective:   Physical Exam  General  Mental Status - Alert. General Appearance - Well groomed. Not in acute distress.  Skin Rashes- No Rashes.  HEENT Head- Normal. Ear Auditory Canal - Left- Normal. Right - Normal.Tympanic Membrane- Left- Normal. Right- Normal. Eye Sclera/Conjunctiva- Left- Normal. Right- Normal. Nose & Sinuses Nasal Mucosa- Left-  Boggy and Congested. Right-  Boggy and  Congested.Bilateral maxillary and frontal sinus pressure. Mouth & Throat Lips: Upper Lip- Normal: no dryness, cracking, pallor, cyanosis, or vesicular eruption. Lower Lip-Normal: no dryness, cracking, pallor, cyanosis or vesicular eruption. Buccal Mucosa- Bilateral- No Aphthous ulcers. Oropharynx- No Discharge or Erythema.  +pnd. Tonsils: Characteristics- Bilateral- No Erythema or Congestion. Size/Enlargement- Bilateral- No enlargement. Discharge- bilateral-None.  Neck Neck- Supple. No Masses.   Chest and Lung Exam Auscultation: Breath Sounds:-Clear even and unlabored.  Cardiovascular Auscultation:Rythm- Regular, rate and rhythm. Murmurs & Other Heart Sounds:Ausculatation of the heart reveal- No Murmurs.  Lymphatic Head & Neck General Head & Neck Lymphatics: Bilateral: Description- No Localized lymphadenopathy.  Assessment & Plan:  You do appear to have probable sinus infection not resolving with doxycycline.  Recommend discontinuing doxycycline and starting Biaxin XL.  You were about to travel to the [place] on Saturday so you could use Afrin preflight to help with nasal congestion and potential ear discomfort.  Would recommend that you continue Flonase and add Astelin.(Considered possible underlying allergy component.)  We discussed potentially giving you Depo-Medrol steroid injection or starting taper prednisone today.  You do not want to go that route presently.  But will make taper dose of prednisone available if [place]  Prescribing Hycodan for moderate to severe cough.  Side effect Rx advisement given.  I just want to use the medication at night.  Diflucan prescription given.  In the event of yeast infection you can use this.  Recommend using probiotic tabs while antibiotic or eat probiotic rich foods.  Follow-up in 10 days or as needed.  Note patient does have history of prior Biaxin use.  Or side effect issues with erythromycin and not allergy signs and symptoms.  Discussed this with patient.

## 2019-01-21 ENCOUNTER — Other Ambulatory Visit: Payer: Self-pay

## 2019-01-21 ENCOUNTER — Ambulatory Visit: Payer: BLUE CROSS/BLUE SHIELD | Admitting: Family

## 2019-01-21 ENCOUNTER — Encounter: Payer: Self-pay | Admitting: Family

## 2019-01-21 VITALS — BP 169/75 | HR 79 | Temp 98.8°F | Resp 16 | Wt 121.0 lb

## 2019-01-21 DIAGNOSIS — J019 Acute sinusitis, unspecified: Secondary | ICD-10-CM | POA: Diagnosis not present

## 2019-01-21 MED ORDER — AMOXICILLIN-POT CLAVULANATE 875-125 MG PO TABS: 1.0000 | ORAL_TABLET | Freq: Two times a day (BID) | ORAL | 0 refills | Status: DC

## 2019-01-21 NOTE — Patient Instructions (Signed)
Please start augmentin for sinusitus.

## 2019-01-21 NOTE — Progress Notes (Signed)
Subjective:    Patient ID: Sherri Hoffman, female    DOB: 04-02-1956, 63 y.o.   MRN: [id number]  HPI  Patient is a 63 year old female who presents today with chief complaint of sinus pressure.  Reports that she has been been dealing with sinus congestion around the 10th of February.  + frontal and maxillary pain.  Reports that she has a lot of drainage which has now gone down into her chest. She reports that she has developed cough.  She denies fever.    HTN- reports good compliance with bp meds.  BP Readings from Last 3 Encounters:  01/21/19 (!) 169/75  12/25/18 (!) 146/75  09/04/18 126/82     Review of Systems    see HPI  Past Medical History:  Diagnosis Date  . Hypertension   . Skin cancer, basal cell      Social History   Socioeconomic History  . Marital status: Married    Spouse name: Not on file  . Number of children: Not on file  . Years of education: Not on file  . Highest education level: Not on file  Occupational History  . 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  Social Needs  . Financial resource strain: Not on file  . Food insecurity:    Worry: Not on file    Inability: Not on file  . Transportation needs:    Medical: Not on file    Non-medical: Not on file  Tobacco Use  . Smoking status: Never Smoker  . Smokeless tobacco: Never Used  Substance and Sexual Activity  . Alcohol use: Yes    Alcohol/week: 3.0 standard drinks    Types: 3 Glasses of wine per week  . Drug use: No  . Sexual activity: Yes    Partners: Male    Birth control/protection: Post-menopausal  Lifestyle  . Physical activity:    Days per week: Not on file    Minutes per session: Not on file  . Stress: Not on file  Relationships  . Social connections:    Talks on phone: Not on file    Gets together: Not on file    Attends religious service: Not on file    Active member of club or organization: Not on file    Attends meetings of clubs or organizations: Not on file   Relationship status: Not on file  . Intimate partner violence:    Fear of current or ex partner: Not on file    Emotionally abused: Not on file    Physically abused: Not on file    Forced sexual activity: Not on file  Other Topics Concern  . Not on file  Social History Narrative  . Not on file    Past Surgical History:  Procedure Laterality Date  . MYOMECTOMY      Family History  Problem Relation Age of Onset  . Heart disease Mother   . Diabetes Mother   . Hyperlipidemia Mother   . COPD Mother   . Cancer Father        melanoma  . Heart disease Father        chf  . COPD Father   . Skin cancer Unknown        Melanoma  . Coronary artery disease Unknown   . COPD Unknown   . Cancer Unknown        skin, melanoma  . Depression Brother     Allergies  Allergen Reactions  . Ace Inhibitors  REACTION: COUGH  . Erythromycin     REACTION: NAUSEA    Current Outpatient Medications on File Prior to Visit  Medication Sig Dispense Refill  . azelastine (ASTELIN) 0.1 % nasal spray Place 2 sprays into both nostrils 2 (two) times daily. Use in each nostril as directed 30 mL 12  . Calcium Carbonate-Vitamin D (CALCIUM 500/D) 500-125 MG-UNIT TABS Take by mouth.      . cholecalciferol (VITAMIN D) 1000 units tablet Take 1,000 Units by mouth daily.    . clarithromycin (BIAXIN XL) 500 MG 24 hr tablet Take 2 tablets (1,000 mg total) by mouth daily. 20 tablet 0  . DIOVAN HCT 160-12.5 MG tablet TAKE 1 TABLET BY MOUTH DAILY 90 tablet 1  . estradiol (ESTRACE) 1 MG tablet Take 1 tablet (1 mg total) by mouth daily. 90 tablet 3  . fluticasone (FLONASE) 50 MCG/ACT nasal spray SHAKE LIQUID AND USE 2 SPRAYS IN EACH NOSTRIL DAILY 16 g 6  . HYDROcodone-homatropine (HYCODAN) 5-1.5 MG/5ML syrup Take 5 mLs by mouth every 6 (six) hours as needed for cough. 100 mL 0  . KLOR-CON M10 10 MEQ tablet TAKE 1 TABLET(10 MEQ) BY MOUTH DAILY 90 tablet 1  . levocetirizine (XYZAL) 5 MG tablet TAKE 1 TABLET(5 MG) BY  MOUTH DAILY AS NEEDED 90 tablet 1  . magnesium 30 MG tablet Take 30 mg by mouth 2 (two) times daily.    . progesterone (PROMETRIUM) 100 MG capsule Take 1 capsule (100 mg total) by mouth daily. 90 capsule 4  . promethazine-dextromethorphan (PROMETHAZINE-DM) 6.25-15 MG/5ML syrup Take 5 mLs by mouth 4 (four) times daily as needed. 118 mL 0  . [DISCONTINUED] triamcinolone (NASACORT AQ) 55 MCG/ACT nasal inhaler Place 2 sprays into the nose daily. 1 Inhaler 12   No current facility-administered medications on file prior to visit.     BP (!) 169/75 (BP Location: Right Arm, Patient Position: Sitting, Cuff Size: Small)   Pulse 79   Temp 98.8 F (37.1 C) (Oral)   Resp 16   Wt 121 lb (54.9 kg)   SpO2 99%   BMI 19.53 kg/m    Objective:   Physical Exam Constitutional:      Appearance: She is well-developed.  HENT:     Right Ear: Ear canal and external ear normal.     Left Ear: There is impacted cerumen.     Nose:     Right Sinus: Maxillary sinus tenderness and frontal sinus tenderness present.     Left Sinus: Maxillary sinus tenderness and frontal sinus tenderness present.  Neck:     Musculoskeletal: Neck supple.     Thyroid: No thyromegaly.  Cardiovascular:     Rate and Rhythm: Normal rate and regular rhythm.     Heart sounds: Normal heart sounds. No murmur.  Pulmonary:     Effort: Pulmonary effort is normal. No respiratory distress.     Breath sounds: Normal breath sounds. No wheezing.  Skin:    General: Skin is warm and dry.  Neurological:     Mental Status: She is alert and oriented to person, place, and time.  Psychiatric:        Behavior: Behavior normal.        Thought Content: Thought content normal.        Judgment: Judgment normal.           Assessment & Plan:  Sinusitis- will rx with Augmentin.  Pt is advised to call if symptoms worsen or if not improved in 3-4  days.

## 2019-02-04 ENCOUNTER — Ambulatory Visit: Payer: BLUE CROSS/BLUE SHIELD

## 2019-02-09 ENCOUNTER — Telehealth: Payer: Self-pay | Admitting: Family Medicine

## 2019-02-09 NOTE — Telephone Encounter (Signed)
FYI

## 2019-02-09 NOTE — Telephone Encounter (Signed)
3/29- 147/83 8:50-pm143/863  3/28   134/81  3/27 149/89          136/87 3/26142/90 3/25  150/87 3/24   137/87 3/22    145/88 3/22     153/90 3/21     118/78  116/76 3/19     112/73              11570 3/17      122/79 3/15     131/80 3/13      144/88 3/11      149/90 The above blood Pressures are the values that the Patient called in and reported.  Patient awaits further recommendations.

## 2019-02-09 NOTE — Telephone Encounter (Signed)
This encounter was created in error - please disregard.

## 2019-02-09 NOTE — Telephone Encounter (Signed)
    [place] Female, 63 y.o., 10/09/1956 MRN:  [id number] Phone:  [phone] Jerilynn Mages) PCP:  Carollee Herter, Alferd Apa, DO Coverage:  BLUE CROSS BLUE SHIELD/BCBS OTHER Next Appt With Family Medicine 03/12/2019 at 2:45 PM Message from Rayann Heman sent at 02/09/2019 8:43 AM EDT   Summary: BP readings    Pt called and stated that she would like a call back from the nurse regarding BP. Pt states that the last time she was in the office it was running high. Pt states that she has been keeping a record of her BP readings. Please advise         Call History    Type Contact Phone  02/09/2019 08:41 AM Phone (Incoming) Toniesha Zellner (Self) [phone]  User: Rayann Heman

## 2019-02-09 NOTE — Telephone Encounter (Signed)
Inc diovan 320/ 12.5 #30  1 po qd  F/u 2-3 weeks or sooner prn

## 2019-02-10 NOTE — Telephone Encounter (Signed)
lmom to call back 

## 2019-02-11 ENCOUNTER — Telehealth: Payer: Self-pay | Admitting: Family Medicine

## 2019-02-11 MED ORDER — VALSARTAN-HYDROCHLOROTHIAZIDE 320-12.5 MG PO TABS: 1.0000 | ORAL_TABLET | Freq: Every day | ORAL | 0 refills | Status: DC

## 2019-02-11 NOTE — Telephone Encounter (Signed)
Pt called about Diovan not being at the pharmacy / please advise

## 2019-02-11 NOTE — Addendum Note (Signed)
Addended byDamita Dunnings D on: 02/11/2019 10:06 AM   Modules accepted: Orders

## 2019-02-11 NOTE — Telephone Encounter (Signed)
Copied from [place] [id number]. Topic: Quick Communication - Rx Refill/Question >> Feb 11, 2019  9:34 AM Alanda Slim E wrote: Medication: DIOVAN HCT 160-12.5 MG tablet was suppose to be called in to pharmacy with an increase in dosage  See Dr. Etter Sjogren telephone note from 3.30.20

## 2019-02-11 NOTE — Telephone Encounter (Signed)
New dose sent to pharmacy. Appt scheduled 03/03/2019 for webex.

## 2019-02-11 NOTE — Telephone Encounter (Signed)
Rx sent. See other telephone note.

## 2019-03-03 ENCOUNTER — Ambulatory Visit (INDEPENDENT_AMBULATORY_CARE_PROVIDER_SITE_OTHER): Payer: BLUE CROSS/BLUE SHIELD | Admitting: Family Medicine

## 2019-03-03 ENCOUNTER — Encounter: Payer: Self-pay | Admitting: Family Medicine

## 2019-03-03 ENCOUNTER — Other Ambulatory Visit: Payer: Self-pay

## 2019-03-03 DIAGNOSIS — I10 Essential (primary) hypertension: Secondary | ICD-10-CM

## 2019-03-03 MED ORDER — POTASSIUM CHLORIDE CRYS ER 10 MEQ PO TBCR: EXTENDED_RELEASE_TABLET | ORAL | 1 refills | Status: DC

## 2019-03-03 NOTE — Progress Notes (Signed)
Virtual Visit via Video Note  I connected with Sherri Hoffman on 03/03/19 at  2:30 PM EDT by a video enabled telemedicine application and verified that I am speaking with the correct person using two identifiers.   I discussed the limitations of evaluation and management by telemedicine and the availability of in person appointments. The patient expressed understanding and agreed to proceed.  History of Present Illness: Pt is home and needing refills on bp meds    No complaints.     Provider --- working in office  Observations/Objective: 134/84 p 68 afebrile  Pt in NAD No sob, no palp No cp  Assessment and Plan: 1. Essential hypertension con't diovan Check labs with cpe  - potassium chloride (KLOR-CON M10) 10 MEQ tablet; TAKE 1 TABLET(10 MEQ) BY MOUTH DAILY  Dispense: 90 tablet; Refill: 1   Follow Up Instructions:    I discussed the assessment and treatment plan with the patient. The patient was provided an opportunity to ask questions and all were answered. The patient agreed with the plan and demonstrated an understanding of the instructions.   The patient was advised to call back or seek an in-person evaluation if the symptoms worsen or if the condition fails to improve as anticipated.  I provided 15 minutes of non-face-to-face time during this encounter.   Ann Held, DO

## 2019-03-12 ENCOUNTER — Encounter: Payer: BLUE CROSS/BLUE SHIELD | Admitting: Family Medicine

## 2019-04-11 ENCOUNTER — Other Ambulatory Visit: Payer: Self-pay | Admitting: Family Medicine

## 2019-05-30 ENCOUNTER — Other Ambulatory Visit: Payer: Self-pay | Admitting: Family Medicine

## 2019-05-30 DIAGNOSIS — I10 Essential (primary) hypertension: Secondary | ICD-10-CM

## 2019-06-01 ENCOUNTER — Encounter: Payer: Self-pay | Admitting: Family Medicine

## 2019-06-01 ENCOUNTER — Ambulatory Visit (INDEPENDENT_AMBULATORY_CARE_PROVIDER_SITE_OTHER): Payer: BC Managed Care – PPO | Admitting: Family Medicine

## 2019-06-01 ENCOUNTER — Other Ambulatory Visit: Payer: Self-pay | Admitting: Family Medicine

## 2019-06-01 ENCOUNTER — Other Ambulatory Visit: Payer: Self-pay

## 2019-06-01 VITALS — BP 154/70 | HR 62 | Temp 98.2°F | Resp 18 | Ht 66.0 in | Wt 121.4 lb

## 2019-06-01 DIAGNOSIS — I10 Essential (primary) hypertension: Secondary | ICD-10-CM | POA: Diagnosis not present

## 2019-06-01 DIAGNOSIS — J301 Allergic rhinitis due to pollen: Secondary | ICD-10-CM | POA: Diagnosis not present

## 2019-06-01 DIAGNOSIS — Z0000 Encounter for general adult medical examination without abnormal findings: Secondary | ICD-10-CM | POA: Diagnosis not present

## 2019-06-01 MED ORDER — VALSARTAN-HYDROCHLOROTHIAZIDE 320-25 MG PO TABS: 1.0000 | ORAL_TABLET | Freq: Every day | ORAL | 1 refills | Status: DC

## 2019-06-01 MED ORDER — POTASSIUM CHLORIDE CRYS ER 10 MEQ PO TBCR: EXTENDED_RELEASE_TABLET | ORAL | 1 refills | Status: DC

## 2019-06-01 MED ORDER — LEVOCETIRIZINE DIHYDROCHLORIDE 5 MG PO TABS: ORAL_TABLET | ORAL | 1 refills | Status: DC

## 2019-06-01 MED ORDER — VALSARTAN-HYDROCHLOROTHIAZIDE 320-12.5 MG PO TABS: 1.0000 | ORAL_TABLET | Freq: Every day | ORAL | 1 refills | Status: DC

## 2019-06-01 NOTE — Patient Instructions (Signed)

## 2019-06-01 NOTE — Progress Notes (Signed)
Subjective:     Sherri Hoffman is a 63 y.o. female and is here for a comprehensive physical exam. The patient reports no problems.  Social History   Socioeconomic History  . Marital status: Married    Spouse name: Not on file  . Number of children: Not on file  . Years of education: Not on file  . Highest education level: Not on file  Occupational History  . 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  Social Needs  . Financial resource strain: Not on file  . Food insecurity    Worry: Not on file    Inability: Not on file  . Transportation needs    Medical: Not on file    Non-medical: Not on file  Tobacco Use  . Smoking status: Never Smoker  . Smokeless tobacco: Never Used  Substance and Sexual Activity  . Alcohol use: Yes    Alcohol/week: 3.0 standard drinks    Types: 3 Glasses of wine per week  . Drug use: No  . Sexual activity: Yes    Partners: Male    Birth control/protection: Post-menopausal  Lifestyle  . Physical activity    Days per week: Not on file    Minutes per session: Not on file  . Stress: Not on file  Relationships  . Social Herbalist on phone: Not on file    Gets together: Not on file    Attends religious service: Not on file    Active member of club or organization: Not on file    Attends meetings of clubs or organizations: Not on file    Relationship status: Not on file  . Intimate partner violence    Fear of current or ex partner: Not on file    Emotionally abused: Not on file    Physically abused: Not on file    Forced sexual activity: Not on file  Other Topics Concern  . Not on file  Social History Narrative  . Not on file   Health Maintenance  Topic Date Due  . HIV Screening  01/13/1971  . TETANUS/TDAP  03/17/2017  . INFLUENZA VACCINE  06/13/2019  . MAMMOGRAM  10/05/2019  . PAP SMEAR-Modifier  10/31/2021  . COLONOSCOPY  08/11/2028  . Hepatitis C Screening  Completed    The following portions of the patient's history  were reviewed and updated as appropriate: She  has a past medical history of Hypertension and Skin cancer, basal cell. She does not have any pertinent problems on file. She  has a past surgical history that includes Myomectomy. Her family history includes COPD in her father, mother, and another family member; Cancer in her father and another family member; Coronary artery disease in an other family member; Depression in her brother; Diabetes in her mother; Heart disease in her father and mother; Hyperlipidemia in her mother; Skin cancer in an other family member. She  reports that she has never smoked. She has never used smokeless tobacco. She reports current alcohol use of about 3.0 standard drinks of alcohol per week. She reports that she does not use drugs. She has a current medication list which includes the following prescription(s): calcium carbonate-vitamin d, cholecalciferol, estradiol, fluticasone, levocetirizine, magnesium, potassium chloride, progesterone, valsartan-hydrochlorothiazide, azelastine, and triamcinolone. Current Outpatient Medications on File Prior to Visit  Medication Sig Dispense Refill  . Calcium Carbonate-Vitamin D (CALCIUM 500/D) 500-125 MG-UNIT TABS Take by mouth.      . cholecalciferol (VITAMIN D) 1000 units tablet Take  2,000 Units by mouth daily.     Marland Kitchen estradiol (ESTRACE) 1 MG tablet Take 1 tablet (1 mg total) by mouth daily. 90 tablet 3  . fluticasone (FLONASE) 50 MCG/ACT nasal spray SHAKE LIQUID AND USE 2 SPRAYS IN EACH NOSTRIL DAILY 16 g 6  . magnesium 30 MG tablet Take 30 mg by mouth 2 (two) times daily.    . progesterone (PROMETRIUM) 100 MG capsule Take 1 capsule (100 mg total) by mouth daily. 90 capsule 4  . azelastine (ASTELIN) 0.1 % nasal spray Place 2 sprays into both nostrils 2 (two) times daily. Use in each nostril as directed (Patient not taking: Reported on 03/03/2019) 30 mL 12  . [DISCONTINUED] triamcinolone (NASACORT AQ) 55 MCG/ACT nasal inhaler Place 2  sprays into the nose daily. 1 Inhaler 12   No current facility-administered medications on file prior to visit.    She is allergic to ace inhibitors; erythromycin; hydrocodone-homatropine; and tussionex pennkinetic er [hydrocod polst-cpm polst er]..  Review of Systems Review of Systems  Constitutional: Negative for activity change, appetite change and fatigue.  HENT: Negative for hearing loss, congestion, tinnitus and ear discharge.  dentist q63m Eyes: Negative for visual disturbance (see optho q1y -- vision corrected to 20/20 with glasses).  Respiratory: Negative for cough, chest tightness and shortness of breath.   Cardiovascular: Negative for chest pain, palpitations and leg swelling.  Gastrointestinal: Negative for abdominal pain, diarrhea, constipation and abdominal distention.  Genitourinary: Negative for urgency, frequency, decreased urine volume and difficulty urinating.  Musculoskeletal: Negative for back pain, arthralgias and gait problem.  Skin: Negative for color change, pallor and rash.  Neurological: Negative for dizziness, light-headedness, numbness and headaches.  Hematological: Negative for adenopathy. Does not bruise/bleed easily.  Psychiatric/Behavioral: Negative for suicidal ideas, confusion, sleep disturbance, self-injury, dysphoric mood, decreased concentration and agitation.       Objective:    BP (!) 154/70 (BP Location: Left Arm, Patient Position: Sitting, Cuff Size: Normal)   Pulse 62   Temp 98.2 F (36.8 C) (Oral)   Resp 18   Ht 5\' 6"  (1.676 m)   Wt 121 lb 6.4 oz (55.1 kg)   SpO2 100%   BMI 19.59 kg/m  General appearance: alert, cooperative, appears stated age and no distress Head: Normocephalic, without obvious abnormality, atraumatic Eyes: conjunctivae/corneas clear. PERRL, EOM's intact. Fundi benign. Ears: normal TM's and external ear canals both ears Nose: Nares normal. Septum midline. Mucosa normal. No drainage or sinus tenderness. Throat:  lips, mucosa, and tongue normal; teeth and gums normal Neck: no adenopathy, no carotid bruit, no JVD, supple, symmetrical, trachea midline and thyroid not enlarged, symmetric, no tenderness/mass/nodules Back: symmetric, no curvature. ROM normal. No CVA tenderness. Lungs: clear to auscultation bilaterally Breasts: gyn Heart: regular rate and rhythm, S1, S2 normal, no murmur, click, rub or gallop Abdomen: soft, non-tender; bowel sounds normal; no masses,  no organomegaly Pelvic: deferred--gyn Extremities: extremities normal, atraumatic, no cyanosis or edema Pulses: 2+ and symmetric Skin: Skin color, texture, turgor normal. No rashes or lesions Lymph nodes: Cervical, supraclavicular, and axillary nodes normal. Neurologic: Alert and oriented X 3, normal strength and tone. Normal symmetric reflexes. Normal coordination and gait    Assessment:    Healthy female exam.      Plan:    ghm utd Check labs  Pt wants to wait in shingrix See After Visit Summary for Counseling Recommendations    1. Seasonal allergic rhinitis due to pollen stable - levocetirizine (XYZAL) 5 MG tablet; TAKE 1 TABLET(5 MG)  BY MOUTH DAILY AS NEEDED  Dispense: 90 tablet; Refill: 1  2. Essential hypertension Poorly controlled will alter medications, encouraged DASH diet, minimize caffeine and obtain adequate sleep. Report concerning symptoms and follow up as directed and as needed - valsartan-hydrochlorothiazide (DIOVAN HCT) 320-12.5 MG tablet; Take 1 tablet by mouth daily.  Dispense: 90 tablet; Refill: 1 - potassium chloride (KLOR-CON M10) 10 MEQ tablet; TAKE 1 TABLET(10 MEQ) BY MOUTH DAILY  Dispense: 90 tablet; Refill: 1 - Lipid panel - CBC with Differential/Platelet - Comprehensive metabolic panel - valsartan-hydrochlorothiazide (DIOVAN HCT) 320-25 MG tablet; Take 1 tablet by mouth daily.  Dispense: 90 tablet; Refill: 1  3. Preventative health care ghm utd Check labs  See above Pt wants to wait on shingrix   Flu shot in sept  - Lipid panel - CBC with Differential/Platelet - TSH - Comprehensive metabolic panel

## 2019-06-02 LAB — COMPREHENSIVE METABOLIC PANEL
ALT: 12 U/L (ref 0–35)
AST: 17 U/L (ref 0–37)
Albumin: 4.4 g/dL (ref 3.5–5.2)
Alkaline Phosphatase: 34 U/L — ABNORMAL LOW (ref 39–117)
BUN: 22 mg/dL (ref 6–23)
CO2: 27 mEq/L (ref 19–32)
Calcium: 9.4 mg/dL (ref 8.4–10.5)
Chloride: 100 mEq/L (ref 96–112)
Creatinine, Ser: 0.66 mg/dL (ref 0.40–1.20)
GFR: 90.06 mL/min (ref >60.00)
Glucose, Bld: 92 mg/dL (ref 70–99)
Potassium: 4.1 mEq/L (ref 3.5–5.1)
Sodium: 137 mEq/L (ref 135–145)
Total Bilirubin: 0.4 mg/dL (ref 0.2–1.2)
Total Protein: 6.9 g/dL (ref 6.0–8.3)

## 2019-06-02 LAB — TSH: TSH: 1.64 u[IU]/mL (ref 0.35–4.50)

## 2019-06-02 LAB — CBC WITH DIFFERENTIAL/PLATELET
Basophils Absolute: 0 10*3/uL (ref 0.0–0.1)
Basophils Relative: 0.8 % (ref 0.0–3.0)
Eosinophils Absolute: 0.1 10*3/uL (ref 0.0–0.7)
Eosinophils Relative: 1.4 % (ref 0.0–5.0)
HCT: 39.3 % (ref 36.0–46.0)
Hemoglobin: 12.9 g/dL (ref 12.0–15.0)
Lymphocytes Relative: 32 % (ref 12.0–46.0)
Lymphs Abs: 1.9 10*3/uL (ref 0.7–4.0)
MCHC: 32.9 g/dL (ref 30.0–36.0)
MCV: 94.5 fl (ref 78.0–100.0)
Monocytes Absolute: 0.5 10*3/uL (ref 0.1–1.0)
Monocytes Relative: 8.2 % (ref 3.0–12.0)
Neutro Abs: 3.4 10*3/uL (ref 1.4–7.7)
Neutrophils Relative %: 57.6 % (ref 43.0–77.0)
Platelets: 258 10*3/uL (ref 150.0–400.0)
RBC: 4.17 Mil/uL (ref 3.87–5.11)
RDW: 13.1 % (ref 11.5–15.5)
WBC: 6 10*3/uL (ref 4.0–10.5)

## 2019-06-02 LAB — LIPID PANEL
Cholesterol: 187 mg/dL (ref 0–200)
HDL: 75.6 mg/dL (ref >39.00)
LDL Cholesterol: 84 mg/dL (ref 0–99)
NonHDL: 111.87
Total CHOL/HDL Ratio: 2
Triglycerides: 140 mg/dL (ref 0.0–149.0)
VLDL: 28 mg/dL (ref 0.0–40.0)

## 2019-06-06 ENCOUNTER — Other Ambulatory Visit: Payer: Self-pay | Admitting: Family Medicine

## 2019-06-06 DIAGNOSIS — J301 Allergic rhinitis due to pollen: Secondary | ICD-10-CM

## 2019-06-27 ENCOUNTER — Encounter: Payer: Self-pay | Admitting: Family Medicine

## 2019-06-29 ENCOUNTER — Encounter: Payer: Self-pay | Admitting: Family Medicine

## 2019-08-12 ENCOUNTER — Ambulatory Visit (INDEPENDENT_AMBULATORY_CARE_PROVIDER_SITE_OTHER): Payer: BC Managed Care – PPO

## 2019-08-12 ENCOUNTER — Other Ambulatory Visit: Payer: Self-pay

## 2019-08-12 DIAGNOSIS — Z23 Encounter for immunization: Secondary | ICD-10-CM | POA: Diagnosis not present

## 2019-08-12 NOTE — Progress Notes (Signed)
Flu shot

## 2019-09-17 DIAGNOSIS — H5213 Myopia, bilateral: Secondary | ICD-10-CM | POA: Diagnosis not present

## 2019-09-17 DIAGNOSIS — H52223 Regular astigmatism, bilateral: Secondary | ICD-10-CM | POA: Diagnosis not present

## 2019-09-17 DIAGNOSIS — H524 Presbyopia: Secondary | ICD-10-CM | POA: Diagnosis not present

## 2019-09-17 DIAGNOSIS — H04123 Dry eye syndrome of bilateral lacrimal glands: Secondary | ICD-10-CM | POA: Diagnosis not present

## 2019-10-07 DIAGNOSIS — Z20828 Contact with and (suspected) exposure to other viral communicable diseases: Secondary | ICD-10-CM | POA: Diagnosis not present

## 2019-11-07 DIAGNOSIS — Z20828 Contact with and (suspected) exposure to other viral communicable diseases: Secondary | ICD-10-CM | POA: Diagnosis not present

## 2019-11-11 DIAGNOSIS — Z20828 Contact with and (suspected) exposure to other viral communicable diseases: Secondary | ICD-10-CM | POA: Diagnosis not present

## 2019-11-19 ENCOUNTER — Ambulatory Visit: Payer: BC Managed Care – PPO | Attending: Internal Medicine

## 2019-11-19 DIAGNOSIS — Z20822 Contact with and (suspected) exposure to covid-19: Secondary | ICD-10-CM

## 2019-11-21 LAB — NOVEL CORONAVIRUS, NAA: SARS-CoV-2, NAA: NOT DETECTED

## 2019-11-26 DIAGNOSIS — Z20828 Contact with and (suspected) exposure to other viral communicable diseases: Secondary | ICD-10-CM | POA: Diagnosis not present

## 2019-12-03 ENCOUNTER — Other Ambulatory Visit: Payer: Self-pay

## 2019-12-03 ENCOUNTER — Encounter: Payer: Self-pay | Admitting: Family Medicine

## 2019-12-03 ENCOUNTER — Ambulatory Visit (INDEPENDENT_AMBULATORY_CARE_PROVIDER_SITE_OTHER): Payer: BC Managed Care – PPO | Admitting: Family Medicine

## 2019-12-03 VITALS — BP 125/79 | HR 97 | Temp 97.7°F | Ht 66.0 in

## 2019-12-03 DIAGNOSIS — R05 Cough: Secondary | ICD-10-CM

## 2019-12-03 DIAGNOSIS — J0140 Acute pansinusitis, unspecified: Secondary | ICD-10-CM | POA: Diagnosis not present

## 2019-12-03 DIAGNOSIS — R059 Cough, unspecified: Secondary | ICD-10-CM

## 2019-12-03 MED ORDER — AMOXICILLIN-POT CLAVULANATE 875-125 MG PO TABS: 1.0000 | ORAL_TABLET | Freq: Two times a day (BID) | ORAL | 0 refills | Status: DC

## 2019-12-03 MED ORDER — PROMETHAZINE-DM 6.25-15 MG/5ML PO SYRP: 5.0000 mL | ORAL_SOLUTION | Freq: Four times a day (QID) | ORAL | 0 refills | Status: DC | PRN

## 2019-12-03 NOTE — Progress Notes (Signed)
Virtual Visit via Video Note  I connected with Sherri Hoffman on 12/03/19 at  2:00 PM EST by a video enabled telemedicine application and verified that I am speaking with the correct person using two identifiers.  Location: Patient: home alone  Provider: office    I discussed the limitations of evaluation and management by telemedicine and the availability of in person appointments. The patient expressed understanding and agreed to proceed.  History of Present Illness: Pt is home c/o sinus congestion   Her covid was negative    Observations/Objective: Vitals:   12/03/19 1407  BP: 125/79  Pulse: 97  Temp: 97.7 F (36.5 C)   Pt is in NAD   Assessment and Plan: 1. Cough Cough med per orders Call back if symptoms persist  - promethazine-dextromethorphan (PROMETHAZINE-DM) 6.25-15 MG/5ML syrup; Take 5 mLs by mouth 4 (four) times daily as needed.  Dispense: 118 mL; Refill: 0  2. Acute non-recurrent pansinusitis abx -- con't flonase  - amoxicillin-clavulanate (AUGMENTIN) 875-125 MG tablet; Take 1 tablet by mouth 2 (two) times daily.  Dispense: 20 tablet; Refill: 0   Follow Up Instructions:    I discussed the assessment and treatment plan with the patient. The patient was provided an opportunity to ask questions and all were answered. The patient agreed with the plan and demonstrated an understanding of the instructions.   The patient was advised to call back or seek an in-person evaluation if the symptoms worsen or if the condition fails to improve as anticipated.  I provided 20 minutes of non-face-to-face time during this encounter.   Ann Held, DO

## 2020-01-14 ENCOUNTER — Other Ambulatory Visit: Payer: Self-pay | Admitting: *Deleted

## 2020-01-14 DIAGNOSIS — J301 Allergic rhinitis due to pollen: Secondary | ICD-10-CM

## 2020-01-14 MED ORDER — LEVOCETIRIZINE DIHYDROCHLORIDE 5 MG PO TABS: ORAL_TABLET | ORAL | 1 refills | Status: DC

## 2020-02-11 LAB — HM PAP SMEAR: HM Pap smear: NORMAL

## 2020-02-22 DIAGNOSIS — Z1231 Encounter for screening mammogram for malignant neoplasm of breast: Secondary | ICD-10-CM | POA: Diagnosis not present

## 2020-02-22 DIAGNOSIS — Z124 Encounter for screening for malignant neoplasm of cervix: Secondary | ICD-10-CM | POA: Diagnosis not present

## 2020-02-22 DIAGNOSIS — Z6820 Body mass index (BMI) 20.0-20.9, adult: Secondary | ICD-10-CM | POA: Diagnosis not present

## 2020-02-22 DIAGNOSIS — Z01419 Encounter for gynecological examination (general) (routine) without abnormal findings: Secondary | ICD-10-CM | POA: Diagnosis not present

## 2020-03-17 DIAGNOSIS — N6489 Other specified disorders of breast: Secondary | ICD-10-CM | POA: Diagnosis not present

## 2020-03-17 DIAGNOSIS — R922 Inconclusive mammogram: Secondary | ICD-10-CM | POA: Diagnosis not present

## 2020-05-17 ENCOUNTER — Telehealth: Payer: Self-pay | Admitting: Family Medicine

## 2020-05-17 DIAGNOSIS — I10 Essential (primary) hypertension: Secondary | ICD-10-CM

## 2020-05-17 MED ORDER — VALSARTAN-HYDROCHLOROTHIAZIDE 320-12.5 MG PO TABS: 1.0000 | ORAL_TABLET | Freq: Every day | ORAL | 0 refills | Status: DC

## 2020-05-17 MED ORDER — POTASSIUM CHLORIDE CRYS ER 10 MEQ PO TBCR: EXTENDED_RELEASE_TABLET | ORAL | 0 refills | Status: DC

## 2020-05-17 NOTE — Telephone Encounter (Signed)
Medication:potassium chloride (KLOR-CON M10) 10 MEQ tablet  valsartan-hydrochlorothiazide (DIOVAN HCT) 320-25 MG tablet    valsartan-hydrochlorothiazide (DIOVAN HCT) 320-12.5 MG tablet   Has the patient contacted their pharmacy? No. (If no, request that the patient contact the pharmacy for the refill.) (If yes, when and what did the pharmacy advise?)  Preferred Pharmacy (with phone number or street name): [id number] [address], [address]  [place], [place] [address]  Phone:  [phone] Fax:  [phone]  [place] #:  [id number]  Agent: Please be advised that RX refills may take up to 3 business days. We ask that you follow-up with your pharmacy.

## 2020-05-17 NOTE — Telephone Encounter (Signed)
Refills sent

## 2020-06-09 ENCOUNTER — Ambulatory Visit (INDEPENDENT_AMBULATORY_CARE_PROVIDER_SITE_OTHER): Payer: Medicare Other | Admitting: Family Medicine

## 2020-06-09 ENCOUNTER — Other Ambulatory Visit: Payer: Self-pay

## 2020-06-09 ENCOUNTER — Encounter: Payer: Self-pay | Admitting: Family Medicine

## 2020-06-09 VITALS — BP 122/74 | HR 71 | Temp 98.1°F | Resp 16 | Ht 66.0 in | Wt 118.0 lb

## 2020-06-09 DIAGNOSIS — I10 Essential (primary) hypertension: Secondary | ICD-10-CM

## 2020-06-09 DIAGNOSIS — J0140 Acute pansinusitis, unspecified: Secondary | ICD-10-CM

## 2020-06-09 DIAGNOSIS — Z0000 Encounter for general adult medical examination without abnormal findings: Secondary | ICD-10-CM

## 2020-06-09 LAB — LIPID PANEL
Cholesterol: 157 mg/dL (ref 0–200)
HDL: 78.5 mg/dL (ref >39.00)
LDL Cholesterol: 48 mg/dL (ref 0–99)
NonHDL: 78.45
Total CHOL/HDL Ratio: 2
Triglycerides: 151 mg/dL — ABNORMAL HIGH (ref 0.0–149.0)
VLDL: 30.2 mg/dL (ref 0.0–40.0)

## 2020-06-09 LAB — CBC WITH DIFFERENTIAL/PLATELET
Basophils Absolute: 0 10*3/uL (ref 0.0–0.1)
Basophils Relative: 0.6 % (ref 0.0–3.0)
Eosinophils Absolute: 0.1 10*3/uL (ref 0.0–0.7)
Eosinophils Relative: 2.8 % (ref 0.0–5.0)
HCT: 38.4 % (ref 36.0–46.0)
Hemoglobin: 12.8 g/dL (ref 12.0–15.0)
Lymphocytes Relative: 42.1 % (ref 12.0–46.0)
Lymphs Abs: 1.6 10*3/uL (ref 0.7–4.0)
MCHC: 33.3 g/dL (ref 30.0–36.0)
MCV: 94.5 fl (ref 78.0–100.0)
Monocytes Absolute: 0.4 10*3/uL (ref 0.1–1.0)
Monocytes Relative: 11.2 % (ref 3.0–12.0)
Neutro Abs: 1.6 10*3/uL (ref 1.4–7.7)
Neutrophils Relative %: 43.3 % (ref 43.0–77.0)
Platelets: 229 10*3/uL (ref 150.0–400.0)
RBC: 4.06 Mil/uL (ref 3.87–5.11)
RDW: 13.2 % (ref 11.5–15.5)
WBC: 3.7 10*3/uL — ABNORMAL LOW (ref 4.0–10.5)

## 2020-06-09 LAB — COMPREHENSIVE METABOLIC PANEL
ALT: 12 U/L (ref 0–35)
AST: 17 U/L (ref 0–37)
Albumin: 4.2 g/dL (ref 3.5–5.2)
Alkaline Phosphatase: 29 U/L — ABNORMAL LOW (ref 39–117)
BUN: 16 mg/dL (ref 6–23)
CO2: 28 mEq/L (ref 19–32)
Calcium: 9.2 mg/dL (ref 8.4–10.5)
Chloride: 105 mEq/L (ref 96–112)
Creatinine, Ser: 0.58 mg/dL (ref 0.40–1.20)
GFR: 104.2 mL/min (ref >60.00)
Glucose, Bld: 99 mg/dL (ref 70–99)
Potassium: 4.2 mEq/L (ref 3.5–5.1)
Sodium: 137 mEq/L (ref 135–145)
Total Bilirubin: 0.5 mg/dL (ref 0.2–1.2)
Total Protein: 7 g/dL (ref 6.0–8.3)

## 2020-06-09 MED ORDER — VALSARTAN-HYDROCHLOROTHIAZIDE 320-25 MG PO TABS: 1.0000 | ORAL_TABLET | Freq: Every day | ORAL | 1 refills | Status: DC

## 2020-06-09 MED ORDER — AMOXICILLIN-POT CLAVULANATE 875-125 MG PO TABS: 1.0000 | ORAL_TABLET | Freq: Two times a day (BID) | ORAL | 0 refills | Status: DC

## 2020-06-09 MED ORDER — POTASSIUM CHLORIDE CRYS ER 10 MEQ PO TBCR: EXTENDED_RELEASE_TABLET | ORAL | 1 refills | Status: DC

## 2020-06-09 NOTE — Progress Notes (Signed)
Subjective:     Sherri Hoffman is a 64 y.o. female and is here for a comprehensive physical exam. The patient reports +sinus infection starting and she has pressure in the head-- she is using her flonase and antihistamine.    Social History   Socioeconomic History  . Marital status: Married    Spouse name: Not on file  . Number of children: Not on file  . Years of education: Not on file  . Highest education level: Not on file  Occupational History  . 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  Tobacco Use  . Smoking status: Never Smoker  . Smokeless tobacco: Never Used  Substance and Sexual Activity  . Alcohol use: Yes    Alcohol/week: 3.0 standard drinks    Types: 3 Glasses of wine per week  . Drug use: No  . Sexual activity: Yes    Partners: Male    Birth control/protection: Post-menopausal  Other Topics Concern  . Not on file  Social History Narrative  . Not on file   Social Determinants of Health   Financial Resource Strain:   . Difficulty of Paying Living Expenses:   Food Insecurity:   . Worried About Charity fundraiser in the Last Year:   . Arboriculturist in the Last Year:   Transportation Needs:   . Film/video editor (Medical):   Marland Kitchen Lack of Transportation (Non-Medical):   Physical Activity:   . Days of Exercise per Week:   . Minutes of Exercise per Session:   Stress:   . Feeling of Stress :   Social Connections:   . Frequency of Communication with Friends and Family:   . Frequency of Social Gatherings with Friends and Family:   . Attends Religious Services:   . Active Member of Clubs or Organizations:   . Attends Archivist Meetings:   Marland Kitchen Marital Status:   Intimate Partner Violence:   . Fear of Current or Ex-Partner:   . Emotionally Abused:   Marland Kitchen Physically Abused:   . Sexually Abused:    Health Maintenance  Topic Date Due  . COVID-19 Vaccine (1) Never done  . HIV Screening  Never done  . TETANUS/TDAP  03/17/2017  . MAMMOGRAM   10/05/2019  . DEXA SCAN  Never done  . PNA vac Low Risk Adult (1 of 2 - PCV13) Never done  . INFLUENZA VACCINE  06/12/2020  . PAP SMEAR-Modifier  10/31/2021  . COLONOSCOPY  08/11/2028  . Hepatitis C Screening  Completed    The following portions of the patient's history were reviewed and updated as appropriate:  She  has a past medical history of Hypertension and Skin cancer, basal cell. She does not have any pertinent problems on file. She  has a past surgical history that includes Myomectomy. Her family history includes COPD in her father, mother, and another family member; Cancer in her father and another family member; Coronary artery disease in an other family member; Depression in her brother; Diabetes in her mother; Heart disease in her father and mother; Hyperlipidemia in her mother; Skin cancer in an other family member. She  reports that she has never smoked. She has never used smokeless tobacco. She reports current alcohol use of about 3.0 standard drinks of alcohol per week. She reports that she does not use drugs. She has a current medication list which includes the following prescription(s): azelastine, calcium carbonate-vitamin d, cholecalciferol, estradiol, fluticasone, levocetirizine, magnesium, potassium chloride, progesterone, valsartan-hydrochlorothiazide,  valsartan-hydrochlorothiazide, and [DISCONTINUED] triamcinolone. Current Outpatient Medications on File Prior to Visit  Medication Sig Dispense Refill  . azelastine (ASTELIN) 0.1 % nasal spray Place 2 sprays into both nostrils 2 (two) times daily. Use in each nostril as directed 30 mL 12  . Calcium Carbonate-Vitamin D (CALCIUM 500/D) 500-125 MG-UNIT TABS Take by mouth.      . cholecalciferol (VITAMIN D) 1000 units tablet Take 2,000 Units by mouth daily.     Marland Kitchen estradiol (ESTRACE) 1 MG tablet Take 1 tablet (1 mg total) by mouth daily. 90 tablet 3  . fluticasone (FLONASE) 50 MCG/ACT nasal spray SHAKE LIQUID AND USE 2 SPRAYS  IN EACH NOSTRIL DAILY 16 g 6  . levocetirizine (XYZAL) 5 MG tablet TAKE 1 TABLET(5 MG) BY MOUTH DAILY AS NEEDED 90 tablet 1  . magnesium 30 MG tablet Take 30 mg by mouth 2 (two) times daily.    . potassium chloride (KLOR-CON M10) 10 MEQ tablet TAKE 1 TABLET(10 MEQ) BY MOUTH DAILY. Pt needs OV for further refills 30 tablet 0  . progesterone (PROMETRIUM) 100 MG capsule Take 1 capsule (100 mg total) by mouth daily. 90 capsule 4  . valsartan-hydrochlorothiazide (DIOVAN HCT) 320-12.5 MG tablet Take 1 tablet by mouth daily. Pt needs OV for further refills. 30 tablet 0  . valsartan-hydrochlorothiazide (DIOVAN HCT) 320-25 MG tablet Take 1 tablet by mouth daily. 90 tablet 1  . [DISCONTINUED] triamcinolone (NASACORT AQ) 55 MCG/ACT nasal inhaler Place 2 sprays into the nose daily. 1 Inhaler 12   No current facility-administered medications on file prior to visit.   She is allergic to ace inhibitors, erythromycin, hydrocodone-homatropine, and tussionex pennkinetic er [hydrocod polst-cpm polst er]..  Review of Systems Review of Systems  Constitutional: Negative for activity change, appetite change and fatigue.  HENT: Negative for hearing loss, congestion, tinnitus and ear discharge.  dentist q9m Eyes: Negative for visual disturbance (see optho q1y -- vision corrected to 20/20 with glasses).  Respiratory: Negative for cough, chest tightness and shortness of breath.   Cardiovascular: Negative for chest pain, palpitations and leg swelling.  Gastrointestinal: Negative for abdominal pain, diarrhea, constipation and abdominal distention.  Genitourinary: Negative for urgency, frequency, decreased urine volume and difficulty urinating.  Musculoskeletal: Negative for back pain, arthralgias and gait problem.  Skin: Negative for color change, pallor and rash.  Neurological: Negative for dizziness, light-headedness, numbness and headaches.  Hematological: Negative for adenopathy. Does not bruise/bleed easily.   Psychiatric/Behavioral: Negative for suicidal ideas, confusion, sleep disturbance, self-injury, dysphoric mood, decreased concentration and agitation.       Objective:    BP 122/74 (BP Location: Left Arm, Patient Position: Sitting, Cuff Size: Normal)   Pulse 71   Temp 98.1 F (36.7 C)   Resp 16   Ht 5\' 6"  (1.676 m)   Wt 118 lb (53.5 kg)   SpO2 98%   BMI 19.05 kg/m  General appearance: alert, cooperative, appears stated age and no distress Head: Normocephalic, without obvious abnormality, atraumatic--- +frontal sinus pressure and max  Eyes: negative findings: lids and lashes normal, conjunctivae and sclerae normal, corneas clear and pupils equal, round, reactive to light and accomodation---  Watery eyes  Ears: normal TM's and external ear canals both ears Neck: no adenopathy, no carotid bruit, no JVD, supple, symmetrical, trachea midline and thyroid not enlarged, symmetric, no tenderness/mass/nodules Back: symmetric, no curvature. ROM normal. No CVA tenderness. Lungs: clear to auscultation bilaterally Breasts: gyn Heart: regular rate and rhythm, S1, S2 normal, no murmur, click, rub or gallop  Abdomen: soft, non-tender; bowel sounds normal; no masses,  no organomegaly Pelvic: deferred --gyn Extremities: extremities normal, atraumatic, no cyanosis or edema Pulses: 2+ and symmetric Skin: Skin color, texture, turgor normal. No rashes or lesions Lymph nodes: Cervical, supraclavicular, and axillary nodes normal. Neurologic: Alert and oriented X 3, normal strength and tone. Normal symmetric reflexes. Normal coordination and gait    Assessment:    Healthy female exam.      Plan:    ghm utd  See above  See After Visit Summary for Counseling Recommendations    1. Essential hypertension Well controlled, no changes to meds. Encouraged heart healthy diet such as the DASH diet and exercise as tolerated.  - valsartan-hydrochlorothiazide (DIOVAN HCT) 320-25 MG tablet; Take 1 tablet by  mouth daily.  Dispense: 90 tablet; Refill: 1 - potassium chloride (KLOR-CON M10) 10 MEQ tablet; TAKE 1 TABLET(10 MEQ) BY MOUTH DAILY.  Dispense: 90 tablet; Refill: 1 - Lipid panel - CBC with Differential/Platelet - Comprehensive metabolic panel  2. Preventative health care See above   3. Acute non-recurrent pansinusitis con't flonase and antihistamine abx per orders  - amoxicillin-clavulanate (AUGMENTIN) 875-125 MG tablet; Take 1 tablet by mouth 2 (two) times daily.  Dispense: 20 tablet; Refill: 0  4. Welcome to Medicare preventive visit  - EKG 12-Lead

## 2020-06-09 NOTE — Patient Instructions (Signed)

## 2020-06-09 NOTE — Progress Notes (Signed)
Subjective:    Sherri Hoffman is a 64 y.o. female who presents for a Welcome to Medicare exam.   Review of Systems  Review of Systems  Constitutional: Negative for activity change, appetite change and fatigue.  HENT: Negative for hearing loss, congestion, tinnitus and ear discharge.   Eyes: Negative for visual disturbance (see optho q1y -- vision corrected to 20/20 with glasses).  Respiratory: Negative for cough, chest tightness and shortness of breath.   Cardiovascular: Negative for chest pain, palpitations and leg swelling.  Gastrointestinal: Negative for abdominal pain, diarrhea, constipation and abdominal distention.  Genitourinary: Negative for urgency, frequency, decreased urine volume and difficulty urinating.  Musculoskeletal: Negative for back pain, arthralgias and gait problem.  Skin: Negative for color change, pallor and rash.  Neurological: Negative for dizziness, light-headedness, numbness and headaches.  Hematological: Negative for adenopathy. Does not bruise/bleed easily.  Psychiatric/Behavioral: Negative for suicidal ideas, confusion, sleep disturbance, self-injury, dysphoric mood, decreased concentration and agitation.  Pt is able to read and write and can do all ADLs No risk for falling No abuse/ violence in home          Objective:    Today's Vitals   06/09/20 0804  BP: 122/74  Pulse: 71  Resp: 16  Temp: 98.1 F (36.7 C)  SpO2: 98%  Weight: 118 lb (53.5 kg)  Height: 5\' 6"  (1.676 m)  Body mass index is 19.05 kg/m.  Medications Outpatient Encounter Medications as of 06/09/2020  Medication Sig  . azelastine (ASTELIN) 0.1 % nasal spray Place 2 sprays into both nostrils 2 (two) times daily. Use in each nostril as directed  . Calcium Carbonate-Vitamin D (CALCIUM 500/D) 500-125 MG-UNIT TABS Take by mouth.    . cholecalciferol (VITAMIN D) 1000 units tablet Take 2,000 Units by mouth daily.   Marland Kitchen estradiol (ESTRACE) 1 MG tablet Take 1 tablet (1 mg total) by  mouth daily.  . fluticasone (FLONASE) 50 MCG/ACT nasal spray SHAKE LIQUID AND USE 2 SPRAYS IN EACH NOSTRIL DAILY  . levocetirizine (XYZAL) 5 MG tablet TAKE 1 TABLET(5 MG) BY MOUTH DAILY AS NEEDED  . magnesium 30 MG tablet Take 30 mg by mouth 2 (two) times daily.  . potassium chloride (KLOR-CON M10) 10 MEQ tablet TAKE 1 TABLET(10 MEQ) BY MOUTH DAILY.  . progesterone (PROMETRIUM) 100 MG capsule Take 1 capsule (100 mg total) by mouth daily.  . valsartan-hydrochlorothiazide (DIOVAN HCT) 320-25 MG tablet Take 1 tablet by mouth daily.  . [DISCONTINUED] potassium chloride (KLOR-CON M10) 10 MEQ tablet TAKE 1 TABLET(10 MEQ) BY MOUTH DAILY. Pt needs OV for further refills  . [DISCONTINUED] valsartan-hydrochlorothiazide (DIOVAN HCT) 320-12.5 MG tablet Take 1 tablet by mouth daily. Pt needs OV for further refills.  . [DISCONTINUED] valsartan-hydrochlorothiazide (DIOVAN HCT) 320-25 MG tablet Take 1 tablet by mouth daily.  Marland Kitchen amoxicillin-clavulanate (AUGMENTIN) 875-125 MG tablet Take 1 tablet by mouth 2 (two) times daily.  . [DISCONTINUED] amoxicillin-clavulanate (AUGMENTIN) 875-125 MG tablet Take 1 tablet by mouth 2 (two) times daily.  . [DISCONTINUED] promethazine-dextromethorphan (PROMETHAZINE-DM) 6.25-15 MG/5ML syrup Take 5 mLs by mouth 4 (four) times daily as needed.  . [DISCONTINUED] triamcinolone (NASACORT AQ) 55 MCG/ACT nasal inhaler Place 2 sprays into the nose daily.   No facility-administered encounter medications on file as of 06/09/2020.     History: Past Medical History:  Diagnosis Date  . Hypertension   . Skin cancer, basal cell    Past Surgical History:  Procedure Laterality Date  . MYOMECTOMY      Family History  Problem Relation Age of Onset  . Heart disease Mother   . Diabetes Mother   . Hyperlipidemia Mother   . COPD Mother   . Cancer Father        melanoma  . Heart disease Father        chf  . COPD Father   . Skin cancer Other        Melanoma  . Coronary artery disease  Other   . COPD Other   . Cancer Other        skin, melanoma  . Depression Brother    Social History   Occupational History  . 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  Tobacco Use  . Smoking status: Never Smoker  . Smokeless tobacco: Never Used  Substance and Sexual Activity  . Alcohol use: Yes    Alcohol/week: 3.0 standard drinks    Types: 3 Glasses of wine per week  . Drug use: No  . Sexual activity: Yes    Partners: Male    Birth control/protection: Post-menopausal    Tobacco Counseling Counseling given: No   Immunizations and Health Maintenance Immunization History  Administered Date(s) Administered  . Influenza Whole 08/09/2010, 08/30/2011, 08/26/2012  . Influenza,inj,Quad PF,6+ Mos 08/12/2019  . Influenza-Unspecified 08/12/2013  . Td 03/18/2007  . Zoster 04/18/2015   Health Maintenance Due  Topic Date Due  . COVID-19 Vaccine (1) Never done  . HIV Screening  Never done  . TETANUS/TDAP  03/17/2017  . MAMMOGRAM  10/05/2019  . DEXA SCAN  Never done  . PNA vac Low Risk Adult (1 of 2 - PCV13) Never done    Activities of Daily Living In your present state of health, do you have any difficulty performing the following activities: 06/09/2020  Hearing? N  Vision? N  Difficulty concentrating or making decisions? N  Walking or climbing stairs? N  Dressing or bathing? N  Doing errands, shopping? N  Some recent data might be hidden         Assessment:    This is a routine wellness examination for this patient .   Vision/Hearing screen  Hearing Screening   125Hz  250Hz  500Hz  1000Hz  2000Hz  3000Hz  4000Hz  6000Hz  8000Hz   Right ear:           Left ear:           Comments: Normal whisper    Visual Acuity Screening   Right eye Left eye Both eyes  Without correction:   20/20  With correction:       Dietary issues and exercise activities discussed:  Current Exercise Habits: Structured exercise class, Time (Minutes): 60, Frequency (Times/Week): 5, Weekly  Exercise (Minutes/Week): 300, Intensity: Moderate, Exercise limited by: None identified  Goals   None    Depression Screen PHQ 2/9 Scores 06/09/2020 06/09/2020 11/26/2017 11/21/2012  PHQ - 2 Score 0 0 0 0  PHQ- 9 Score 0 - - -     Fall Risk Fall Risk  06/09/2020  Falls in the past year? 0    Cognitive Function: MMSE - Mini Mental State Exam 06/09/2020  Orientation to time 5  Orientation to Place 5  Registration 3  Attention/ Calculation 5  Recall 3  Language- name 2 objects 2  Language- repeat 1  Language- follow 3 step command 3  Language- read & follow direction 1  Write a sentence 1  Copy design 1  Total score 30        Patient Care Team: Roma Schanz  R, DO as PCP - General Delsa Bern, MD as Consulting Physician (Obstetrics and Gynecology) [facility]     Plan:   f/u 1 year with RN for AWV I have personally reviewed and noted the following in the patient's chart:   . Medical and social history . Use of alcohol, tobacco or illicit drugs  . Current medications and supplements . Functional ability and status . Nutritional status . Physical activity . Advanced directives . List of other physicians . Hospitalizations, surgeries, and ER visits in previous 12 months . Vitals . Screenings to include cognitive, depression, and falls . Referrals and appointments  In addition, I have reviewed and discussed with patient certain preventive protocols, quality metrics, and best practice recommendations. A written personalized care plan for preventive services as well as general preventive health recommendations were provided to patient.     [place], DO 06/09/2020

## 2020-06-14 ENCOUNTER — Other Ambulatory Visit: Payer: Self-pay | Admitting: Family Medicine

## 2020-06-14 DIAGNOSIS — I10 Essential (primary) hypertension: Secondary | ICD-10-CM

## 2020-09-28 DIAGNOSIS — H5213 Myopia, bilateral: Secondary | ICD-10-CM | POA: Diagnosis not present

## 2020-12-12 ENCOUNTER — Ambulatory Visit: Payer: Medicare Other | Admitting: Family Medicine

## 2020-12-15 ENCOUNTER — Other Ambulatory Visit: Payer: Self-pay

## 2020-12-15 ENCOUNTER — Telehealth (INDEPENDENT_AMBULATORY_CARE_PROVIDER_SITE_OTHER): Payer: Medicare Other | Admitting: Internal Medicine

## 2020-12-15 ENCOUNTER — Encounter: Payer: Self-pay | Admitting: Internal Medicine

## 2020-12-15 ENCOUNTER — Other Ambulatory Visit: Payer: Self-pay | Admitting: Family Medicine

## 2020-12-15 VITALS — BP 127/73 | Ht 66.0 in | Wt 120.0 lb

## 2020-12-15 DIAGNOSIS — J069 Acute upper respiratory infection, unspecified: Secondary | ICD-10-CM

## 2020-12-15 DIAGNOSIS — I10 Essential (primary) hypertension: Secondary | ICD-10-CM

## 2020-12-15 MED ORDER — BENZONATATE 200 MG PO CAPS
200.0000 mg | ORAL_CAPSULE | Freq: Three times a day (TID) | ORAL | 0 refills | Status: DC | PRN
Start: 2020-12-15 — End: 2020-12-22

## 2020-12-15 MED ORDER — AZELASTINE HCL 0.1 % NA SOLN: 2.0000 | Freq: Two times a day (BID) | NASAL | 12 refills | Status: DC

## 2020-12-15 NOTE — Progress Notes (Signed)
Pre visit review using our clinic review tool, if applicable. No additional management support is needed unless otherwise documented below in the visit note. 

## 2020-12-15 NOTE — Progress Notes (Signed)
Subjective:    Patient ID: Sherri Hoffman, female    DOB: [birth year], 65 y.o.   MRN: [id number]  DOS:  12/15/2020 Type of visit - description: Virtual Visit via Video Note  I connected with the above patient  by a video enabled telemedicine application and verified that I am speaking with the correct person using two identifiers.   THIS ENCOUNTER IS A VIRTUAL VISIT DUE TO COVID-19 - PATIENT WAS NOT SEEN IN THE OFFICE. PATIENT HAS CONSENTED TO VIRTUAL VISIT / TELEMEDICINE VISIT   Location of patient: home  Location of provider: office  Persons participating in the virtual visit: patient, provider   I discussed the limitations of evaluation and management by telemedicine and the availability of in person appointments. The patient expressed understanding and agreed to proceed.  Acute Symptoms a started a week ago: Sinus pressure located at the forehead, behind the eyes, also at the ears. + Postnasal dripping. Using Flonase and occasional Mucinex DM.   Review of Systems Denies fever chills No nausea vomiting Some fatigue but no myalgias Denies chest pain, cough or difficulty breathing  Past Medical History:  Diagnosis Date  . Hypertension   . Skin cancer, basal cell     Past Surgical History:  Procedure Laterality Date  . MYOMECTOMY      Allergies as of 12/15/2020      Reactions   Ace Inhibitors    REACTION: COUGH   Erythromycin    REACTION: NAUSEA   Hydrocodone-homatropine    Nausea/vomitting   Tussionex Pennkinetic Er [hydrocod Polst-cpm Polst Er]    nausea      Medication List       Accurate as of December 15, 2020 11:39 AM. If you have any questions, ask your nurse or doctor.        STOP taking these medications   amoxicillin-clavulanate 875-125 MG tablet Commonly known as: Augmentin Stopped by: Kathlene November, MD     TAKE these medications   azelastine 0.1 % nasal spray Commonly known as: ASTELIN Place 2 sprays into both nostrils 2 (two) times daily.  Use in each nostril as directed   Calcium Carbonate-Vitamin D 500-125 MG-UNIT Tabs Take by mouth.   cholecalciferol 1000 units tablet Commonly known as: VITAMIN D Take 2,000 Units by mouth daily.   estradiol 1 MG tablet Commonly known as: ESTRACE Take 1 tablet (1 mg total) by mouth daily.   fluticasone 50 MCG/ACT nasal spray Commonly known as: FLONASE SHAKE LIQUID AND USE 2 SPRAYS IN EACH NOSTRIL DAILY   levocetirizine 5 MG tablet Commonly known as: XYZAL TAKE 1 TABLET(5 MG) BY MOUTH DAILY AS NEEDED   magnesium 30 MG tablet Take 30 mg by mouth 2 (two) times daily.   potassium chloride 10 MEQ tablet Commonly known as: Klor-Con M10 TAKE 1 TABLET(10 MEQ) BY MOUTH DAILY.   progesterone 100 MG capsule Commonly known as: PROMETRIUM Take 1 capsule (100 mg total) by mouth daily.   valsartan-hydrochlorothiazide 320-25 MG tablet Commonly known as: Diovan HCT Take 1 tablet by mouth daily.          Objective:   Physical Exam BP 127/73   Ht 5\' 6"  (1.676 m)   Wt 120 lb (54.4 kg)   BMI 19.37 kg/m  This is a virtual video visit, she is alert oriented x3, in no distress, no cough noted, she looks healthy.    Assessment    65 year old female, PMH includes HTN, menopausal on HRT, presents with:  URI Respiratory symptoms  a started 1 week ago.  She had 2 COVID vaccines and a flu shot. Symptoms are not severe, does not look toxic. Recommend to check for Covid, patient declined thus recommend to quarantine for the next 3 days to be sure she does not pass the virus to other people. Otherwise recommend rest, fluids, continue Flonase, refill Astelin, continue Mucinex DM, send a prescription for Tessalon Perles. A summary of my recommendations sent. I do not think she needs antibiotics unless she is not better in the next few days.    I discussed the assessment and treatment plan with the patient. The patient was provided an opportunity to ask questions and all were answered. The  patient agreed with the plan and demonstrated an understanding of the instructions.   The patient was advised to call back or seek an in-person evaluation if the symptoms worsen or if the condition fails to improve as anticipated.

## 2020-12-16 DIAGNOSIS — J0100 Acute maxillary sinusitis, unspecified: Secondary | ICD-10-CM | POA: Diagnosis not present

## 2020-12-16 DIAGNOSIS — Z20822 Contact with and (suspected) exposure to covid-19: Secondary | ICD-10-CM | POA: Diagnosis not present

## 2020-12-16 DIAGNOSIS — J069 Acute upper respiratory infection, unspecified: Secondary | ICD-10-CM | POA: Diagnosis not present

## 2020-12-16 DIAGNOSIS — R0981 Nasal congestion: Secondary | ICD-10-CM | POA: Diagnosis not present

## 2020-12-19 ENCOUNTER — Telehealth: Payer: Medicare Other | Admitting: Family Medicine

## 2020-12-22 ENCOUNTER — Encounter: Payer: Self-pay | Admitting: Family Medicine

## 2020-12-22 ENCOUNTER — Ambulatory Visit (INDEPENDENT_AMBULATORY_CARE_PROVIDER_SITE_OTHER): Payer: Medicare Other | Admitting: Family Medicine

## 2020-12-22 ENCOUNTER — Other Ambulatory Visit: Payer: Self-pay

## 2020-12-22 VITALS — BP 136/90 | HR 71 | Temp 98.1°F | Resp 18 | Ht 66.0 in | Wt 124.2 lb

## 2020-12-22 DIAGNOSIS — I10 Essential (primary) hypertension: Secondary | ICD-10-CM | POA: Diagnosis not present

## 2020-12-22 DIAGNOSIS — H6122 Impacted cerumen, left ear: Secondary | ICD-10-CM

## 2020-12-22 LAB — LIPID PANEL
Cholesterol: 189 mg/dL (ref 0–200)
HDL: 78.6 mg/dL (ref >39.00)
LDL Cholesterol: 92 mg/dL (ref 0–99)
NonHDL: 110.04
Total CHOL/HDL Ratio: 2
Triglycerides: 92 mg/dL (ref 0.0–149.0)
VLDL: 18.4 mg/dL (ref 0.0–40.0)

## 2020-12-22 LAB — COMPREHENSIVE METABOLIC PANEL
ALT: 12 U/L (ref 0–35)
AST: 16 U/L (ref 0–37)
Albumin: 4.4 g/dL (ref 3.5–5.2)
Alkaline Phosphatase: 35 U/L — ABNORMAL LOW (ref 39–117)
BUN: 21 mg/dL (ref 6–23)
CO2: 31 mEq/L (ref 19–32)
Calcium: 10.1 mg/dL (ref 8.4–10.5)
Chloride: 101 mEq/L (ref 96–112)
Creatinine, Ser: 0.63 mg/dL (ref 0.40–1.20)
GFR: 92.75 mL/min (ref >60.00)
Glucose, Bld: 95 mg/dL (ref 70–99)
Potassium: 5.5 mEq/L — ABNORMAL HIGH (ref 3.5–5.1)
Sodium: 140 mEq/L (ref 135–145)
Total Bilirubin: 0.4 mg/dL (ref 0.2–1.2)
Total Protein: 7.2 g/dL (ref 6.0–8.3)

## 2020-12-22 NOTE — Progress Notes (Signed)
Patient ID: Sherri Hoffman, female    DOB: 1956/01/16  Age: 65 y.o. MRN: [id number]    Subjective:  Subjective  HPI Sherri Hoffman presents for f/u bp and is getting over a sinus infection .Marland Kitchen    She was seen and uc and tested neg for covid.  She was put on augmentiin and is getting better but her L ear still feels full.  No other complaints   Review of Systems  Constitutional: Negative for appetite change, diaphoresis, fatigue and unexpected weight change.  HENT: Positive for ear pain and hearing loss. Negative for congestion, facial swelling, postnasal drip and rhinorrhea.   Eyes: Negative for pain, redness and visual disturbance.  Respiratory: Negative for cough, chest tightness, shortness of breath and wheezing.   Cardiovascular: Negative for chest pain, palpitations and leg swelling.  Endocrine: Negative for cold intolerance, heat intolerance, polydipsia, polyphagia and polyuria.  Genitourinary: Negative for difficulty urinating, dysuria and frequency.  Neurological: Negative for dizziness, light-headedness, numbness and headaches.    History Past Medical History:  Diagnosis Date  . Hypertension   . Skin cancer, basal cell     She has a past surgical history that includes Myomectomy.   Her family history includes COPD in her father, mother, and another family member; Cancer in her father and another family member; Coronary artery disease in an other family member; Depression in her brother; Diabetes in her mother; Heart disease in her father and mother; Hyperlipidemia in her mother; Skin cancer in an other family member.She reports that she has never smoked. She has never used smokeless tobacco. She reports current alcohol use of about 3.0 standard drinks of alcohol per week. She reports that she does not use drugs.  Current Outpatient Medications on File Prior to Visit  Medication Sig Dispense Refill  . azelastine (ASTELIN) 0.1 % nasal spray Place 2 sprays into both nostrils 2  (two) times daily. Use in each nostril as directed 30 mL 12  . Calcium Carbonate-Vitamin D 500-125 MG-UNIT TABS Take by mouth.    . cholecalciferol (VITAMIN D) 1000 units tablet Take 2,000 Units by mouth daily.     Marland Kitchen estradiol (ESTRACE) 1 MG tablet Take 1 tablet (1 mg total) by mouth daily. 90 tablet 3  . fluticasone (FLONASE) 50 MCG/ACT nasal spray SHAKE LIQUID AND USE 2 SPRAYS IN EACH NOSTRIL DAILY 16 g 6  . levocetirizine (XYZAL) 5 MG tablet TAKE 1 TABLET(5 MG) BY MOUTH DAILY AS NEEDED 90 tablet 1  . magnesium 30 MG tablet Take 30 mg by mouth 2 (two) times daily.    . potassium chloride (KLOR-CON) 10 MEQ tablet TAKE 1 TABLET(10 MEQ) BY MOUTH DAILY 90 tablet 1  . progesterone (PROMETRIUM) 100 MG capsule Take 1 capsule (100 mg total) by mouth daily. 90 capsule 4  . valsartan-hydrochlorothiazide (DIOVAN-HCT) 320-25 MG tablet TAKE 1 TABLET BY MOUTH DAILY 90 tablet 1  . [DISCONTINUED] triamcinolone (NASACORT AQ) 55 MCG/ACT nasal inhaler Place 2 sprays into the nose daily. 1 Inhaler 12   No current facility-administered medications on file prior to visit.     Objective:  Objective  Physical Exam Vitals and nursing note reviewed.  Constitutional:      Appearance: She is well-developed and well-nourished.  HENT:     Head: Normocephalic and atraumatic.     Right Ear: Tympanic membrane, ear canal and external ear normal. There is no impacted cerumen.     Left Ear: Tympanic membrane, ear canal and external ear normal. There  is impacted cerumen.     Ears:   Eyes:     Extraocular Movements: EOM normal.     Conjunctiva/sclera: Conjunctivae normal.  Neck:     Thyroid: No thyromegaly.     Vascular: No carotid bruit or JVD.  Cardiovascular:     Rate and Rhythm: Normal rate and regular rhythm.     Heart sounds: Normal heart sounds. No murmur heard.   Pulmonary:     Effort: Pulmonary effort is normal. No respiratory distress.     Breath sounds: Normal breath sounds. No wheezing or rales.   Chest:     Chest wall: No tenderness.  Musculoskeletal:        General: No edema.     Cervical back: Normal range of motion and neck supple.  Neurological:     Mental Status: She is alert and oriented to person, place, and time.  Psychiatric:        Mood and Affect: Mood and affect normal.    BP 136/90 (BP Location: Right Arm, Patient Position: Sitting, Cuff Size: Normal)   Pulse 71   Temp 98.1 F (36.7 C) (Oral)   Resp 18   Ht 5\' 6"  (1.676 m)   Wt 124 lb 3.2 oz (56.3 kg)   SpO2 98%   BMI 20.05 kg/m  Wt Readings from Last 3 Encounters:  12/22/20 124 lb 3.2 oz (56.3 kg)  12/15/20 120 lb (54.4 kg)  06/09/20 118 lb (53.5 kg)     Lab Results  Component Value Date   WBC 3.7 (L) 06/09/2020   HGB 12.8 06/09/2020   HCT 38.4 06/09/2020   PLT 229.0 06/09/2020   GLUCOSE 99 06/09/2020   CHOL 157 06/09/2020   TRIG 151.0 (H) 06/09/2020   HDL 78.50 06/09/2020   LDLCALC 48 06/09/2020   ALT 12 06/09/2020   AST 17 06/09/2020   NA 137 06/09/2020   K 4.2 06/09/2020   CL 105 06/09/2020   CREATININE 0.58 06/09/2020   BUN 16 06/09/2020   CO2 28 06/09/2020   TSH 1.64 06/01/2019   HGBA1C 5.5 01/11/2014   MICROALBUR 0.9 04/18/2015    No results found.   Assessment & Plan:  Plan  I have discontinued Charitie Hinote. Rueb's benzonatate. I am also having her maintain her Calcium Carbonate-Vitamin D, progesterone, estradiol, cholecalciferol, magnesium, fluticasone, levocetirizine, azelastine, valsartan-hydrochlorothiazide, and potassium chloride.  No orders of the defined types were placed in this encounter.   Problem List Items Addressed This Visit      Unprioritized   Essential hypertension    Well controlled, no changes to meds. Encouraged heart healthy diet such as the DASH diet and exercise as tolerated.       Left ear impacted cerumen - Primary    Ear irrigated successfully  Pt got a lot of relief of pressure after irrigation        Other Visit Diagnoses    Primary  hypertension       Relevant Orders   Lipid panel   Comprehensive metabolic panel      Follow-up: Return in about 6 months (around 06/21/2021) for hypertension.  Ann Held, DO

## 2020-12-22 NOTE — Patient Instructions (Signed)

## 2020-12-22 NOTE — Assessment & Plan Note (Signed)
Well controlled, no changes to meds. Encouraged heart healthy diet such as the DASH diet and exercise as tolerated.  °

## 2020-12-22 NOTE — Assessment & Plan Note (Signed)
Ear irrigated successfully  Pt got a lot of relief of pressure after irrigation

## 2021-01-30 ENCOUNTER — Ambulatory Visit (INDEPENDENT_AMBULATORY_CARE_PROVIDER_SITE_OTHER): Payer: Medicare Other | Admitting: Family Medicine

## 2021-01-30 ENCOUNTER — Other Ambulatory Visit: Payer: Self-pay

## 2021-01-30 ENCOUNTER — Encounter: Payer: Self-pay | Admitting: Family Medicine

## 2021-01-30 VITALS — BP 140/90 | HR 71 | Temp 98.5°F | Resp 16 | Ht 66.0 in | Wt 125.8 lb

## 2021-01-30 DIAGNOSIS — J0140 Acute pansinusitis, unspecified: Secondary | ICD-10-CM | POA: Diagnosis not present

## 2021-01-30 MED ORDER — AMOXICILLIN-POT CLAVULANATE 875-125 MG PO TABS: 1.0000 | ORAL_TABLET | Freq: Two times a day (BID) | ORAL | 0 refills | Status: DC

## 2021-01-30 MED ORDER — AZELASTINE HCL 0.1 % NA SOLN: 2.0000 | Freq: Two times a day (BID) | NASAL | 12 refills | Status: DC

## 2021-01-30 NOTE — Patient Instructions (Signed)
Sinusitis, Adult Sinusitis is inflammation of your sinuses. Sinuses are hollow spaces in the bones around your face. Your sinuses are located:  Around your eyes.  In the middle of your forehead.  Behind your nose.  In your cheekbones. Mucus normally drains out of your sinuses. When your nasal tissues become inflamed or swollen, mucus can become trapped or blocked. This allows bacteria, viruses, and fungi to grow, which leads to infection. Most infections of the sinuses are caused by a virus. Sinusitis can develop quickly. It can last for up to 4 weeks (acute) or for more than 12 weeks (chronic). Sinusitis often develops after a cold. What are the causes? This condition is caused by anything that creates swelling in the sinuses or stops mucus from draining. This includes:  Allergies.  Asthma.  Infection from bacteria or viruses.  Deformities or blockages in your nose or sinuses.  Abnormal growths in the nose (nasal polyps).  Pollutants, such as chemicals or irritants in the air.  Infection from fungi (rare). What increases the risk? You are more likely to develop this condition if you:  Have a weak body defense system (immune system).  Do a lot of swimming or diving.  Overuse nasal sprays.  Smoke. What are the signs or symptoms? The main symptoms of this condition are pain and a feeling of pressure around the affected sinuses. Other symptoms include:  Stuffy nose or congestion.  Thick drainage from your nose.  Swelling and warmth over the affected sinuses.  Headache.  Upper toothache.  A cough that may get worse at night.  Extra mucus that collects in the throat or the back of the nose (postnasal drip).  Decreased sense of smell and taste.  Fatigue.  A fever.  Sore throat.  Bad breath. How is this diagnosed? This condition is diagnosed based on:  Your symptoms.  Your medical history.  A physical exam.  Tests to find out if your condition is  acute or chronic. This may include: ? Checking your nose for nasal polyps. ? Viewing your sinuses using a device that has a light (endoscope). ? Testing for allergies or bacteria. ? Imaging tests, such as an MRI or CT scan. In rare cases, a bone biopsy may be done to rule out more serious types of fungal sinus disease. How is this treated? Treatment for sinusitis depends on the cause and whether your condition is chronic or acute.  If caused by a virus, your symptoms should go away on their own within 10 days. You may be given medicines to relieve symptoms. They include: ? Medicines that shrink swollen nasal passages (topical intranasal decongestants). ? Medicines that treat allergies (antihistamines). ? A spray that eases inflammation of the nostrils (topical intranasal corticosteroids). ? Rinses that help get rid of thick mucus in your nose (nasal saline washes).  If caused by bacteria, your health care provider may recommend waiting to see if your symptoms improve. Most bacterial infections will get better without antibiotic medicine. You may be given antibiotics if you have: ? A severe infection. ? A weak immune system.  If caused by narrow nasal passages or nasal polyps, you may need to have surgery. Follow these instructions at home: Medicines  Take, use, or apply over-the-counter and prescription medicines only as told by your health care provider. These may include nasal sprays.  If you were prescribed an antibiotic medicine, take it as told by your health care provider. Do not stop taking the antibiotic even if you start   to feel better. Hydrate and humidify  Drink enough fluid to keep your urine pale yellow. Staying hydrated will help to thin your mucus.  Use a cool mist humidifier to keep the humidity level in your home above 50%.  Inhale steam for 10-15 minutes, 3-4 times a day, or as told by your health care provider. You can do this in the bathroom while a hot shower is  running.  Limit your exposure to cool or dry air.   Rest  Rest as much as possible.  Sleep with your head raised (elevated).  Make sure you get enough sleep each night. General instructions  Apply a warm, moist washcloth to your face 3-4 times a day or as told by your health care provider. This will help with discomfort.  Wash your hands often with soap and water to reduce your exposure to germs. If soap and water are not available, use hand sanitizer.  Do not smoke. Avoid being around people who are smoking (secondhand smoke).  Keep all follow-up visits as told by your health care provider. This is important.   Contact a health care provider if:  You have a fever.  Your symptoms get worse.  Your symptoms do not improve within 10 days. Get help right away if:  You have a severe headache.  You have persistent vomiting.  You have severe pain or swelling around your face or eyes.  You have vision problems.  You develop confusion.  Your neck is stiff.  You have trouble breathing. Summary  Sinusitis is soreness and inflammation of your sinuses. Sinuses are hollow spaces in the bones around your face.  This condition is caused by nasal tissues that become inflamed or swollen. The swelling traps or blocks the flow of mucus. This allows bacteria, viruses, and fungi to grow, which leads to infection.  If you were prescribed an antibiotic medicine, take it as told by your health care provider. Do not stop taking the antibiotic even if you start to feel better.  Keep all follow-up visits as told by your health care provider. This is important. This information is not intended to replace advice given to you by your health care provider. Make sure you discuss any questions you have with your health care provider. Document Revised: 03/31/2018 Document Reviewed: 03/31/2018 [employer]vier Patient Education  2021 [employer]  

## 2021-01-30 NOTE — Progress Notes (Signed)
Patient ID: Sherri Hoffman, female    DOB: May 31, 1956  Age: 65 y.o. MRN: [id number]    Subjective:  Subjective  HPI Sherri Hoffman presents for an office visit today. She complains of sinus pain, sinus pressure and teeth pain that started 4 days. She notes she recently woke up with facial swelling and applied warm compressed to relieve her symptoms. She states that she has been using Flonase and Azelastine to relieve her symptoms, however she is still experiencing sinus pain and sinus pressure. She relates her recent high blood pressure to her sinus pressure. She denies any chest pain, SOB, fever, abdominal pain, cough, chills, sore throat, dysuria, urinary incontinence, back pain, HA, or N/VD at this time.    Review of Systems  Constitutional: Negative for chills and fever.  HENT: Positive for facial swelling, sinus pressure and sinus pain. Negative for ear pain and sore throat.        (+)teeth pain  Eyes: Negative for pain.  Respiratory: Negative for cough and shortness of breath.   Cardiovascular: Negative for chest pain, palpitations and leg swelling.  Gastrointestinal: Negative for abdominal pain, blood in stool, constipation, diarrhea, nausea and vomiting.  Genitourinary: Negative for dysuria, frequency, hematuria and urgency.  Musculoskeletal: Negative for back pain and myalgias.  Skin: Negative for rash.  Neurological: Negative for headaches.    History Past Medical History:  Diagnosis Date  . Hypertension   . Skin cancer, basal cell     She has a past surgical history that includes Myomectomy.   Her family history includes COPD in her father, mother, and another family member; Cancer in her father and another family member; Coronary artery disease in an other family member; Depression in her brother; Diabetes in her mother; Heart disease in her father and mother; Hyperlipidemia in her mother; Skin cancer in an other family member.She reports that she has never smoked. She  has never used smokeless tobacco. She reports current alcohol use of about 3.0 standard drinks of alcohol per week. She reports that she does not use drugs.  Current Outpatient Medications on File Prior to Visit  Medication Sig Dispense Refill  . Calcium Carbonate-Vitamin D 500-125 MG-UNIT TABS Take by mouth.    . cholecalciferol (VITAMIN D) 1000 units tablet Take 2,000 Units by mouth daily.     Marland Kitchen estradiol (ESTRACE) 1 MG tablet Take 1 tablet (1 mg total) by mouth daily. 90 tablet 3  . fluticasone (FLONASE) 50 MCG/ACT nasal spray SHAKE LIQUID AND USE 2 SPRAYS IN EACH NOSTRIL DAILY 16 g 6  . levocetirizine (XYZAL) 5 MG tablet TAKE 1 TABLET(5 MG) BY MOUTH DAILY AS NEEDED 90 tablet 1  . magnesium 30 MG tablet Take 30 mg by mouth 2 (two) times daily.    . potassium chloride (KLOR-CON) 10 MEQ tablet TAKE 1 TABLET(10 MEQ) BY MOUTH DAILY 90 tablet 1  . progesterone (PROMETRIUM) 100 MG capsule Take 1 capsule (100 mg total) by mouth daily. 90 capsule 4  . valsartan-hydrochlorothiazide (DIOVAN-HCT) 320-25 MG tablet TAKE 1 TABLET BY MOUTH DAILY 90 tablet 1  . [DISCONTINUED] triamcinolone (NASACORT AQ) 55 MCG/ACT nasal inhaler Place 2 sprays into the nose daily. 1 Inhaler 12   No current facility-administered medications on file prior to visit.     Objective:  Objective  Physical Exam Vitals and nursing note reviewed.  Constitutional:      General: She is not in acute distress.    Appearance: Normal appearance. She is well-developed. She is not  ill-appearing.  HENT:     Head: Normocephalic and atraumatic.     Right Ear: External ear normal.     Left Ear: External ear normal.     Nose:     Right Sinus: Maxillary sinus tenderness present.     Left Sinus: Maxillary sinus tenderness present.     Comments: maxillary sinus pressure bilaterally present   Eyes:     Extraocular Movements: Extraocular movements intact.     Pupils: Pupils are equal, round, and reactive to light.  Cardiovascular:      Rate and Rhythm: Normal rate and regular rhythm.     Pulses: Normal pulses.     Heart sounds: Normal heart sounds. No murmur heard. No gallop.   Pulmonary:     Effort: Pulmonary effort is normal. No respiratory distress.     Breath sounds: Normal breath sounds. No wheezing, rhonchi or rales.  Abdominal:     General: Bowel sounds are normal. There is no distension.     Palpations: Abdomen is soft.     Tenderness: There is no abdominal tenderness. There is no guarding.     Hernia: No hernia is present.  Musculoskeletal:     Cervical back: Normal range of motion and neck supple.  Skin:    General: Skin is warm and dry.  Neurological:     Mental Status: She is alert and oriented to person, place, and time.  Psychiatric:        Behavior: Behavior normal.    BP 140/90 (BP Location: Left Arm, Patient Position: Sitting, Cuff Size: Normal)   Pulse 71   Temp 98.5 F (36.9 C) (Oral)   Resp 16   Ht 5\' 6"  (1.676 m)   Wt 125 lb 12.8 oz (57.1 kg)   SpO2 97%   BMI 20.30 kg/m  Wt Readings from Last 3 Encounters:  01/30/21 125 lb 12.8 oz (57.1 kg)  12/22/20 124 lb 3.2 oz (56.3 kg)  12/15/20 120 lb (54.4 kg)     Lab Results  Component Value Date   WBC 3.7 (L) 06/09/2020   HGB 12.8 06/09/2020   HCT 38.4 06/09/2020   PLT 229.0 06/09/2020   GLUCOSE 95 12/22/2020   CHOL 189 12/22/2020   TRIG 92.0 12/22/2020   HDL 78.60 12/22/2020   LDLCALC 92 12/22/2020   ALT 12 12/22/2020   AST 16 12/22/2020   NA 140 12/22/2020   K 5.5 No hemolysis seen (H) 12/22/2020   CL 101 12/22/2020   CREATININE 0.63 12/22/2020   BUN 21 12/22/2020   CO2 31 12/22/2020   TSH 1.64 06/01/2019   HGBA1C 5.5 01/11/2014   MICROALBUR 0.9 04/18/2015    No results found.   Assessment & Plan:  Plan    Meds ordered this encounter  Medications  . amoxicillin-clavulanate (AUGMENTIN) 875-125 MG tablet    Sig: Take 1 tablet by mouth 2 (two) times daily.    Dispense:  20 tablet    Refill:  0  . azelastine  (ASTELIN) 0.1 % nasal spray    Sig: Place 2 sprays into both nostrils 2 (two) times daily. Use in each nostril as directed    Dispense:  30 mL    Refill:  12    Problem List Items Addressed This Visit      Unprioritized   Pansinusitis - Primary   Relevant Medications   amoxicillin-clavulanate (AUGMENTIN) 875-125 MG tablet   azelastine (ASTELIN) 0.1 % nasal spray      con't flonase and  astelin as well as antihistamine abx send in per orders   Follow-up: Return if symptoms worsen or fail to improve.   I,Gordon Zheng,acting as a Education administrator for [employer], DO.,have documented all relevant documentation on the behalf of Ann Held, DO,as directed by  Ann Held, DO while in the presence of [place], DO, have reviewed all documentation for this visit. The documentation on 01/30/21 for the exam, diagnosis, procedures, and orders are all accurate and complete. Ann Held, DO

## 2021-03-16 DIAGNOSIS — M25562 Pain in left knee: Secondary | ICD-10-CM | POA: Diagnosis not present

## 2021-03-23 DIAGNOSIS — L814 Other melanin hyperpigmentation: Secondary | ICD-10-CM | POA: Diagnosis not present

## 2021-03-23 DIAGNOSIS — D225 Melanocytic nevi of trunk: Secondary | ICD-10-CM | POA: Diagnosis not present

## 2021-03-23 DIAGNOSIS — L905 Scar conditions and fibrosis of skin: Secondary | ICD-10-CM | POA: Diagnosis not present

## 2021-03-23 DIAGNOSIS — Z85828 Personal history of other malignant neoplasm of skin: Secondary | ICD-10-CM | POA: Diagnosis not present

## 2021-03-28 DIAGNOSIS — Z1231 Encounter for screening mammogram for malignant neoplasm of breast: Secondary | ICD-10-CM | POA: Diagnosis not present

## 2021-03-28 LAB — HM MAMMOGRAPHY

## 2021-04-05 DIAGNOSIS — M810 Age-related osteoporosis without current pathological fracture: Secondary | ICD-10-CM | POA: Diagnosis not present

## 2021-04-05 DIAGNOSIS — Z79890 Hormone replacement therapy (postmenopausal): Secondary | ICD-10-CM | POA: Diagnosis not present

## 2021-04-05 DIAGNOSIS — N950 Postmenopausal bleeding: Secondary | ICD-10-CM | POA: Diagnosis not present

## 2021-04-05 DIAGNOSIS — Z01419 Encounter for gynecological examination (general) (routine) without abnormal findings: Secondary | ICD-10-CM | POA: Diagnosis not present

## 2021-04-11 ENCOUNTER — Other Ambulatory Visit: Payer: Self-pay | Admitting: Family Medicine

## 2021-04-11 DIAGNOSIS — I10 Essential (primary) hypertension: Secondary | ICD-10-CM

## 2021-05-09 DIAGNOSIS — N950 Postmenopausal bleeding: Secondary | ICD-10-CM | POA: Diagnosis not present

## 2021-05-09 DIAGNOSIS — E559 Vitamin D deficiency, unspecified: Secondary | ICD-10-CM | POA: Diagnosis not present

## 2021-05-09 DIAGNOSIS — R9389 Abnormal findings on diagnostic imaging of other specified body structures: Secondary | ICD-10-CM | POA: Diagnosis not present

## 2021-06-09 ENCOUNTER — Encounter: Payer: Medicare Other | Admitting: Family Medicine

## 2021-06-13 ENCOUNTER — Encounter: Payer: Self-pay | Admitting: Family Medicine

## 2021-06-13 ENCOUNTER — Ambulatory Visit (INDEPENDENT_AMBULATORY_CARE_PROVIDER_SITE_OTHER): Payer: Medicare Other | Admitting: Family Medicine

## 2021-06-13 ENCOUNTER — Other Ambulatory Visit: Payer: Self-pay

## 2021-06-13 VITALS — BP 122/88 | HR 67 | Temp 97.7°F | Resp 18 | Ht 65.0 in | Wt 122.4 lb

## 2021-06-13 DIAGNOSIS — I10 Essential (primary) hypertension: Secondary | ICD-10-CM | POA: Diagnosis not present

## 2021-06-13 DIAGNOSIS — Z23 Encounter for immunization: Secondary | ICD-10-CM | POA: Diagnosis not present

## 2021-06-13 DIAGNOSIS — Z0000 Encounter for general adult medical examination without abnormal findings: Secondary | ICD-10-CM | POA: Diagnosis not present

## 2021-06-13 LAB — CBC WITH DIFFERENTIAL/PLATELET
Basophils Absolute: 0 10*3/uL (ref 0.0–0.1)
Basophils Relative: 0.4 % (ref 0.0–3.0)
Eosinophils Absolute: 0.1 10*3/uL (ref 0.0–0.7)
Eosinophils Relative: 2 % (ref 0.0–5.0)
HCT: 39 % (ref 36.0–46.0)
Hemoglobin: 13.1 g/dL (ref 12.0–15.0)
Lymphocytes Relative: 37.9 % (ref 12.0–46.0)
Lymphs Abs: 1.7 10*3/uL (ref 0.7–4.0)
MCHC: 33.6 g/dL (ref 30.0–36.0)
MCV: 93.5 fl (ref 78.0–100.0)
Monocytes Absolute: 0.5 10*3/uL (ref 0.1–1.0)
Monocytes Relative: 10.2 % (ref 3.0–12.0)
Neutro Abs: 2.2 10*3/uL (ref 1.4–7.7)
Neutrophils Relative %: 49.5 % (ref 43.0–77.0)
Platelets: 248 10*3/uL (ref 150.0–400.0)
RBC: 4.18 Mil/uL (ref 3.87–5.11)
RDW: 13.3 % (ref 11.5–15.5)
WBC: 4.5 10*3/uL (ref 4.0–10.5)

## 2021-06-13 LAB — COMPREHENSIVE METABOLIC PANEL
ALT: 12 U/L (ref 0–35)
AST: 17 U/L (ref 0–37)
Albumin: 4.4 g/dL (ref 3.5–5.2)
Alkaline Phosphatase: 33 U/L — ABNORMAL LOW (ref 39–117)
BUN: 20 mg/dL (ref 6–23)
CO2: 28 mEq/L (ref 19–32)
Calcium: 9.9 mg/dL (ref 8.4–10.5)
Chloride: 101 mEq/L (ref 96–112)
Creatinine, Ser: 0.55 mg/dL (ref 0.40–1.20)
GFR: 95.52 mL/min (ref >60.00)
Glucose, Bld: 91 mg/dL (ref 70–99)
Potassium: 5 mEq/L (ref 3.5–5.1)
Sodium: 137 mEq/L (ref 135–145)
Total Bilirubin: 0.5 mg/dL (ref 0.2–1.2)
Total Protein: 7.2 g/dL (ref 6.0–8.3)

## 2021-06-13 LAB — LIPID PANEL
Cholesterol: 183 mg/dL (ref 0–200)
HDL: 81.7 mg/dL (ref >39.00)
LDL Cholesterol: 80 mg/dL (ref 0–99)
NonHDL: 101.68
Total CHOL/HDL Ratio: 2
Triglycerides: 108 mg/dL (ref 0.0–149.0)
VLDL: 21.6 mg/dL (ref 0.0–40.0)

## 2021-06-13 NOTE — Assessment & Plan Note (Signed)
Well controlled, no changes to meds. Encouraged heart healthy diet such as the DASH diet and exercise as tolerated.  °

## 2021-06-13 NOTE — Patient Instructions (Signed)
Preventive Care 66 Years and Older, Female Preventive care refers to lifestyle choices and visits with your health care provider that can promote health and wellness. This includes: A yearly physical exam. This is also called an annual wellness visit. Regular dental and eye exams. Immunizations. Screening for certain conditions. Healthy lifestyle choices, such as: Eating a healthy diet. Getting regular exercise. Not using drugs or products that contain nicotine and tobacco. Limiting alcohol use. What can I expect for my preventive care visit? Physical exam Your health care provider will check your: Height and weight. These may be used to calculate your BMI (body mass index). BMI is a measurement that tells if you are at a healthy weight. Heart rate and blood pressure. Body temperature. Skin for abnormal spots. Counseling Your health care provider may ask you questions about your: Past medical problems. Family's medical history. Alcohol, tobacco, and drug use. Emotional well-being. Home life and relationship well-being. Sexual activity. Diet, exercise, and sleep habits. History of falls. Memory and ability to understand (cognition). Work and work Statistician. Pregnancy and menstrual history. Access to firearms. What immunizations do I need?  Vaccines are usually given at various ages, according to a schedule. Your health care provider will recommend vaccines for you based on your age, medicalhistory, and lifestyle or other factors, such as travel or where you work. What tests do I need? Blood tests Lipid and cholesterol levels. These may be checked every 5 years, or more often depending on your overall health. Hepatitis C test. Hepatitis B test. Screening Lung cancer screening. You may have this screening every year starting at age 44 if you have a 30-pack-year history of smoking and currently smoke or have quit within the past 15 years. Colorectal cancer screening. All  adults should have this screening starting at age 39 and continuing until age 65. Your health care provider may recommend screening at age 61 if you are at increased risk. You will have tests every 1-10 years, depending on your results and the type of screening test. Diabetes screening. This is done by checking your blood sugar (glucose) after you have not eaten for a while (fasting). You may have this done every 1-3 years. Mammogram. This may be done every 1-2 years. Talk with your health care provider about how often you should have regular mammograms. Abdominal aortic aneurysm (AAA) screening. You may need this if you are a current or former smoker. BRCA-related cancer screening. This may be done if you have a family history of breast, ovarian, tubal, or peritoneal cancers. Other tests STD (sexually transmitted disease) testing, if you are at risk. Bone density scan. This is done to screen for osteoporosis. You may have this done starting at age 54. Talk with your health care provider about your test results, treatment options,and if necessary, the need for more tests. Follow these instructions at home: Eating and drinking  Eat a diet that includes fresh fruits and vegetables, whole grains, lean protein, and low-fat dairy products. Limit your intake of foods with high amounts of sugar, saturated fats, and salt. Take vitamin and mineral supplements as recommended by your health care provider. Do not drink alcohol if your health care provider tells you not to drink. If you drink alcohol: Limit how much you have to 0-1 drink a day. Be aware of how much alcohol is in your drink. In the [place] one drink equals one 12 oz bottle of beer (355 mL), one 5 oz glass of wine (148 mL), or one 1  oz glass of hard liquor (44 mL).  Lifestyle Take daily care of your teeth and gums. Brush your teeth every morning and night with fluoride toothpaste. Floss one time each day. Stay active. Exercise for at  least 30 minutes 5 or more days each week. Do not use any products that contain nicotine or tobacco, such as cigarettes, e-cigarettes, and chewing tobacco. If you need help quitting, ask your health care provider. Do not use drugs. If you are sexually active, practice safe sex. Use a condom or other form of protection in order to prevent STIs (sexually transmitted infections). Talk with your health care provider about taking a low-dose aspirin or statin. Find healthy ways to cope with stress, such as: Meditation, yoga, or listening to music. Journaling. Talking to a trusted person. Spending time with friends and family. Safety Always wear your seat belt while driving or riding in a vehicle. Do not drive: If you have been drinking alcohol. Do not ride with someone who has been drinking. When you are tired or distracted. While texting. Wear a helmet and other protective equipment during sports activities. If you have firearms in your house, make sure you follow all gun safety procedures. What's next? Visit your health care provider once a year for an annual wellness visit. Ask your health care provider how often you should have your eyes and teeth checked. Stay up to date on all vaccines. This information is not intended to replace advice given to you by your health care provider. Make sure you discuss any questions you have with your healthcare provider. Document Revised: 10/19/2020 Document Reviewed: 10/23/2018 Elsevier Patient Education  2022 [employer].

## 2021-06-13 NOTE — Assessment & Plan Note (Signed)
ghm utd Check labs  

## 2021-06-13 NOTE — Assessment & Plan Note (Signed)
Pneum vaccine given

## 2021-06-13 NOTE — Progress Notes (Signed)
Subjective:   By signing my name below, I, [name], attest that this documentation has been prepared under the direction and in the presence of Dr. Roma Schanz, DO. 06/13/2021    Patient ID: Sherri Hoffman, female    DOB: [birth year], 65 y.o.   MRN: [id number]  Chief Complaint  Patient presents with   Annual Exam    Pt states fasting     HPI Patient is in today for a comprehensive physical exam.  She reports having a knee injury during yoga and seeing a sports medicine specialist to manage it since her last visit. During the yoga session she heard a pop sound in the back of her knee. Her provider completed an x-ray and thinks that she possibly tore her meniscus. She was given an injection in her knee and found it wasn't effective. She continues having mild pain in the back of her knee.  She reports having a small mass in her lower back. She mentions that it is not painful and does not bother her at this time.  She continues seeing her GYN, Dr. Cletis Media, to manage her mammograms and pap smears. She denies having any fever, ear pain, congestion, sinus pain, sore throat, eye pain, chest pain, palpations, cough, SOB, wheezing, n/v/d, constipation, blood in stool, dysuria, frequency, hematuria, or headaches at this time. She has no recent changes in her family history.  She is due for the pneumonia vaccines. She has 2 Covid-19 moderna vaccines and is interested in getting the 2 booster vaccines at a later date. She is due for the new shingrex shingles vaccine and is willing to get it a later date her pharmacy. She is due for the tetanus vaccines and is willing to get it at later date.    Past Medical History:  Diagnosis Date   Hypertension    Skin cancer, basal cell     Past Surgical History:  Procedure Laterality Date   MYOMECTOMY      Family History  Problem Relation Age of Onset   Heart disease Mother    Diabetes Mother    Hyperlipidemia Mother    COPD Mother     Cancer Father        melanoma   Heart disease Father        chf   COPD Father    Skin cancer Other        Melanoma   Coronary artery disease Other    COPD Other    Cancer Other        skin, melanoma   Depression Brother     Social History   Socioeconomic History   Marital status: Married    Spouse name: Not on file   Number of children: Not on file   Years of education: Not on file   Highest education level: Not on file  Occupational History   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  Tobacco Use   Smoking status: Never   Smokeless tobacco: Never  Substance and Sexual Activity   Alcohol use: Yes    Alcohol/week: 3.0 standard drinks    Types: 3 Glasses of wine per week   Drug use: No   Sexual activity: Yes    Partners: Male    Birth control/protection: Post-menopausal  Other Topics Concern   Not on file  Social History Narrative   Not on file   Social Determinants of Health   Financial Resource Strain: Not on file  Food Insecurity: Not  on file  Transportation Needs: Not on file  Physical Activity: Not on file  Stress: Not on file  Social Connections: Not on file  Intimate Partner Violence: Not on file    Outpatient Medications Prior to Visit  Medication Sig Dispense Refill   azelastine (ASTELIN) 0.1 % nasal spray Place 2 sprays into both nostrils 2 (two) times daily. Use in each nostril as directed 30 mL 12   Calcium Carbonate-Vitamin D 500-125 MG-UNIT TABS Take by mouth.     cholecalciferol (VITAMIN D) 1000 units tablet Take 2,000 Units by mouth daily.      estradiol (ESTRACE) 1 MG tablet Take 1 tablet (1 mg total) by mouth daily. 90 tablet 3   fluticasone (FLONASE) 50 MCG/ACT nasal spray SHAKE LIQUID AND USE 2 SPRAYS IN EACH NOSTRIL DAILY 16 g 6   levocetirizine (XYZAL) 5 MG tablet TAKE 1 TABLET(5 MG) BY MOUTH DAILY AS NEEDED 90 tablet 1   magnesium 30 MG tablet Take 30 mg by mouth 2 (two) times daily.     potassium chloride (KLOR-CON) 10 MEQ tablet  TAKE 1 TABLET(10 MEQ) BY MOUTH DAILY 90 tablet 1   progesterone (PROMETRIUM) 100 MG capsule Take 1 capsule (100 mg total) by mouth daily. 90 capsule 4   valsartan-hydrochlorothiazide (DIOVAN-HCT) 320-25 MG tablet TAKE 1 TABLET BY MOUTH DAILY 90 tablet 1   amoxicillin-clavulanate (AUGMENTIN) 875-125 MG tablet Take 1 tablet by mouth 2 (two) times daily. (Patient not taking: Reported on 06/13/2021) 20 tablet 0   No facility-administered medications prior to visit.    Allergies  Allergen Reactions   Ace Inhibitors     REACTION: COUGH   Erythromycin     REACTION: NAUSEA   Hydrocodone Bit-Homatrop Mbr     Nausea/vomitting   Tussionex Pennkinetic Er [Hydrocod Polst-Cpm Polst Er]     nausea    Review of Systems  Constitutional:  Negative for fever.  HENT:  Negative for congestion, ear pain, sinus pain and sore throat.   Eyes:  Negative for pain.  Respiratory:  Negative for cough, shortness of breath and wheezing.   Cardiovascular:  Negative for chest pain and palpitations.  Gastrointestinal:  Negative for blood in stool, constipation, diarrhea, nausea and vomiting.  Genitourinary:  Negative for dysuria, frequency and hematuria.  Musculoskeletal:  Positive for joint pain (back of knee).  Neurological:  Negative for headaches.  Psychiatric/Behavioral:  Negative for depression. The patient is not nervous/anxious.       Objective:    Physical Exam Constitutional:      General: She is not in acute distress.    Appearance: Normal appearance. She is not ill-appearing.  HENT:     Head: Normocephalic and atraumatic.     Right Ear: Tympanic membrane, ear canal and external ear normal.     Left Ear: Tympanic membrane, ear canal and external ear normal.  Eyes:     Extraocular Movements: Extraocular movements intact.     Pupils: Pupils are equal, round, and reactive to light.  Cardiovascular:     Rate and Rhythm: Normal rate and regular rhythm.     Heart sounds: Normal heart sounds. No  murmur heard.   No gallop.  Pulmonary:     Effort: Pulmonary effort is normal. No respiratory distress.     Breath sounds: Normal breath sounds. No wheezing or rales.  Abdominal:     General: Bowel sounds are normal. There is no distension.     Palpations: Abdomen is soft. There is no mass.  Tenderness: There is no abdominal tenderness. There is no guarding or rebound.  Skin:    General: Skin is warm and dry.     Comments: Flat purple spot on top of right ear  Neurological:     Mental Status: She is alert and oriented to person, place, and time.  Psychiatric:        Behavior: Behavior normal.    BP 122/88 (BP Location: Right Arm, Patient Position: Sitting, Cuff Size: Normal)   Pulse 67   Temp 97.7 F (36.5 C) (Oral)   Resp 18   Ht '5\' 5"'$  (1.651 m)   Wt 122 lb 6.4 oz (55.5 kg)   SpO2 98%   BMI 20.37 kg/m  Wt Readings from Last 3 Encounters:  06/13/21 122 lb 6.4 oz (55.5 kg)  01/30/21 125 lb 12.8 oz (57.1 kg)  12/22/20 124 lb 3.2 oz (56.3 kg)    Diabetic Foot Exam - Simple   No data filed    Lab Results  Component Value Date   WBC 3.7 (L) 06/09/2020   HGB 12.8 06/09/2020   HCT 38.4 06/09/2020   PLT 229.0 06/09/2020   GLUCOSE 95 12/22/2020   CHOL 189 12/22/2020   TRIG 92.0 12/22/2020   HDL 78.60 12/22/2020   LDLCALC 92 12/22/2020   ALT 12 12/22/2020   AST 16 12/22/2020   NA 140 12/22/2020   K 5.5 No hemolysis seen (H) 12/22/2020   CL 101 12/22/2020   CREATININE 0.63 12/22/2020   BUN 21 12/22/2020   CO2 31 12/22/2020   TSH 1.64 06/01/2019   HGBA1C 5.5 01/11/2014   MICROALBUR 0.9 04/18/2015    Lab Results  Component Value Date   TSH 1.64 06/01/2019   Lab Results  Component Value Date   WBC 3.7 (L) 06/09/2020   HGB 12.8 06/09/2020   HCT 38.4 06/09/2020   MCV 94.5 06/09/2020   PLT 229.0 06/09/2020   Lab Results  Component Value Date   NA 140 12/22/2020   K 5.5 No hemolysis seen (H) 12/22/2020   CO2 31 12/22/2020   GLUCOSE 95 12/22/2020   BUN  21 12/22/2020   CREATININE 0.63 12/22/2020   BILITOT 0.4 12/22/2020   ALKPHOS 35 (L) 12/22/2020   AST 16 12/22/2020   ALT 12 12/22/2020   PROT 7.2 12/22/2020   ALBUMIN 4.4 12/22/2020   CALCIUM 10.1 12/22/2020   GFR 92.75 12/22/2020   Lab Results  Component Value Date   CHOL 189 12/22/2020   Lab Results  Component Value Date   HDL 78.60 12/22/2020   Lab Results  Component Value Date   LDLCALC 92 12/22/2020   Lab Results  Component Value Date   TRIG 92.0 12/22/2020   Lab Results  Component Value Date   CHOLHDL 2 12/22/2020   Lab Results  Component Value Date   HGBA1C 5.5 01/11/2014   Mammogram- Last completed 03/28/2021. Results normal. Repeat in 1 year. Pap smear- Last completed 02/11/2020. Results normal.  Dexa- She has recently completed a bone density scan with her GYN, Dr. Cletis Media. She reports the results are normal.  Colonoscopy- Last completed 08/11/2018. Results showed few scattered diverticula in entire examined colon, otherwise results normal. Repeat in 10 years.      Assessment & Plan:   Problem List Items Addressed This Visit       Unprioritized   Need for pneumococcal vaccination    Pneum vaccine given        Relevant Orders   Pneumococcal conjugate vaccine  20-valent (Prevnar 20) (Completed)   Preventative health care - Primary    ghm utd Check labs        Primary hypertension    Well controlled, no changes to meds. Encouraged heart healthy diet such as the DASH diet and exercise as tolerated.        Relevant Orders   Lipid panel   Comprehensive metabolic panel   CBC with Differential/Platelet     No orders of the defined types were placed in this encounter.   I, Dr. Roma Schanz, DO, personally preformed the services described in this documentation.  All medical record entries made by the scribe were at my direction and in my presence.  I have reviewed the chart and discharge instructions (if applicable) and agree that the  record reflects my personal performance and is accurate and complete. 06/13/2021   I,[name],acting as a scribe for Ann Held, DO.,have documented all relevant documentation on the behalf of Ann Held, DO,as directed by  Ann Held, DO while in the presence of Ann Held, DO.   Ann Held, DO

## 2021-07-06 ENCOUNTER — Ambulatory Visit: Payer: Medicare Other

## 2021-07-25 ENCOUNTER — Ambulatory Visit: Payer: Medicare Other

## 2021-08-11 ENCOUNTER — Other Ambulatory Visit: Payer: Self-pay

## 2021-08-11 ENCOUNTER — Telehealth (INDEPENDENT_AMBULATORY_CARE_PROVIDER_SITE_OTHER): Payer: Medicare Other | Admitting: Family Medicine

## 2021-08-11 DIAGNOSIS — J0140 Acute pansinusitis, unspecified: Secondary | ICD-10-CM

## 2021-08-11 MED ORDER — AMOXICILLIN-POT CLAVULANATE 875-125 MG PO TABS: 1.0000 | ORAL_TABLET | Freq: Two times a day (BID) | ORAL | 0 refills | Status: DC

## 2021-08-11 NOTE — Assessment & Plan Note (Signed)
con't astepro and flonase augmentin sent in  F/u in office if symptoms worsen or do not improve

## 2021-08-11 NOTE — Progress Notes (Signed)
MyChart Video Visit    Virtual Visit via Video Note   This visit type was conducted due to national recommendations for restrictions regarding the COVID-19 Pandemic (e.g. social distancing) in an effort to limit this patient's exposure and mitigate transmission in our community. This patient is at least at moderate risk for complications without adequate follow up. This format is felt to be most appropriate for this patient at this time. Physical exam was limited by quality of the video and audio technology used for the visit. Alinda Dooms was able to get the patient set up on a video visit.  Patient location: home alone Patient and provider in visit Provider location: Office  I discussed the limitations of evaluation and management by telemedicine and the availability of in person appointments. The patient expressed understanding and agreed to proceed.  Visit Date: 08/11/2021 Today's healthcare provider: Ann Held, DO     Subjective:    Patient ID: Sherri Hoffman, female    DOB: [birth year], 65 y.o.   MRN: [id number]  Chief Complaint  Patient presents with   Sinus Problem    HPI Patient is in today for sinus infection x 1-2 weeks   + headache  and sinus pressure  pt using her flonase and astepro  She is flying tomorrow and is concerned it will get worse  No fevers   Past Medical History:  Diagnosis Date   Hypertension    Skin cancer, basal cell     Past Surgical History:  Procedure Laterality Date   MYOMECTOMY      Family History  Problem Relation Age of Onset   Heart disease Mother    Diabetes Mother    Hyperlipidemia Mother    COPD Mother    Cancer Father        melanoma   Heart disease Father        chf   COPD Father    Skin cancer Other        Melanoma   Coronary artery disease Other    COPD Other    Cancer Other        skin, melanoma   Depression Brother     Social History   Socioeconomic History   Marital status: Married     Spouse name: Not on file   Number of children: Not on file   Years of education: Not on file   Highest education level: Not on file  Occupational History   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  Tobacco Use   Smoking status: Never   Smokeless tobacco: Never  Substance and Sexual Activity   Alcohol use: Yes    Alcohol/week: 3.0 standard drinks    Types: 3 Glasses of wine per week   Drug use: No   Sexual activity: Yes    Partners: Male    Birth control/protection: Post-menopausal  Other Topics Concern   Not on file  Social History Narrative   Not on file   Social Determinants of Health   Financial Resource Strain: Not on file  Food Insecurity: Not on file  Transportation Needs: Not on file  Physical Activity: Not on file  Stress: Not on file  Social Connections: Not on file  Intimate Partner Violence: Not on file    Outpatient Medications Prior to Visit  Medication Sig Dispense Refill   azelastine (ASTELIN) 0.1 % nasal spray Place 2 sprays into both nostrils 2 (two) times daily. Use in each nostril as  directed 30 mL 12   Calcium Carbonate-Vitamin D 500-125 MG-UNIT TABS Take by mouth.     cholecalciferol (VITAMIN D) 1000 units tablet Take 2,000 Units by mouth daily.      estradiol (ESTRACE) 1 MG tablet Take 1 tablet (1 mg total) by mouth daily. 90 tablet 3   fluticasone (FLONASE) 50 MCG/ACT nasal spray SHAKE LIQUID AND USE 2 SPRAYS IN EACH NOSTRIL DAILY 16 g 6   levocetirizine (XYZAL) 5 MG tablet TAKE 1 TABLET(5 MG) BY MOUTH DAILY AS NEEDED 90 tablet 1   magnesium 30 MG tablet Take 30 mg by mouth 2 (two) times daily.     potassium chloride (KLOR-CON) 10 MEQ tablet TAKE 1 TABLET(10 MEQ) BY MOUTH DAILY 90 tablet 1   progesterone (PROMETRIUM) 100 MG capsule Take 1 capsule (100 mg total) by mouth daily. 90 capsule 4   valsartan-hydrochlorothiazide (DIOVAN-HCT) 320-25 MG tablet TAKE 1 TABLET BY MOUTH DAILY 90 tablet 1   No facility-administered medications prior to  visit.    Allergies  Allergen Reactions   Ace Inhibitors     REACTION: COUGH   Erythromycin     REACTION: NAUSEA   Hydrocodone Bit-Homatrop Mbr     Nausea/vomitting   Tussionex Pennkinetic Er [Hydrocod Polst-Cpm Polst Er]     nausea    Review of Systems  Constitutional:  Negative for chills, fever and malaise/fatigue.  HENT:  Positive for congestion, ear pain and sinus pain. Negative for ear discharge, hearing loss, nosebleeds and sore throat.   Eyes:  Negative for discharge.  Respiratory:  Negative for cough, sputum production and shortness of breath.   Cardiovascular:  Negative for chest pain, palpitations and leg swelling.  Gastrointestinal:  Negative for abdominal pain, blood in stool, constipation, diarrhea, heartburn, nausea and vomiting.  Genitourinary:  Negative for dysuria, frequency, hematuria and urgency.  Musculoskeletal:  Negative for back pain, falls and myalgias.  Skin:  Negative for rash.  Neurological:  Negative for dizziness, sensory change, loss of consciousness, weakness and headaches.  Endo/Heme/Allergies:  Negative for environmental allergies. Does not bruise/bleed easily.  Psychiatric/Behavioral:  Negative for depression and suicidal ideas. The patient is not nervous/anxious and does not have insomnia.       Objective:    Physical Exam Vitals and nursing note reviewed.  Constitutional:      Appearance: Normal appearance.  Pulmonary:     Effort: Pulmonary effort is normal.  Neurological:     Mental Status: She is alert.  Psychiatric:        Mood and Affect: Mood normal.        Behavior: Behavior normal.        Thought Content: Thought content normal.    There were no vitals taken for this visit. Wt Readings from Last 3 Encounters:  06/13/21 122 lb 6.4 oz (55.5 kg)  01/30/21 125 lb 12.8 oz (57.1 kg)  12/22/20 124 lb 3.2 oz (56.3 kg)    Diabetic Foot Exam - Simple   No data filed    Lab Results  Component Value Date   WBC 4.5 06/13/2021    HGB 13.1 06/13/2021   HCT 39.0 06/13/2021   PLT 248.0 06/13/2021   GLUCOSE 91 06/13/2021   CHOL 183 06/13/2021   TRIG 108.0 06/13/2021   HDL 81.70 06/13/2021   LDLCALC 80 06/13/2021   ALT 12 06/13/2021   AST 17 06/13/2021   NA 137 06/13/2021   K 5.0 06/13/2021   CL 101 06/13/2021   CREATININE 0.55 06/13/2021  BUN 20 06/13/2021   CO2 28 06/13/2021   TSH 1.64 06/01/2019   HGBA1C 5.5 01/11/2014   MICROALBUR 0.9 04/18/2015    Lab Results  Component Value Date   TSH 1.64 06/01/2019   Lab Results  Component Value Date   WBC 4.5 06/13/2021   HGB 13.1 06/13/2021   HCT 39.0 06/13/2021   MCV 93.5 06/13/2021   PLT 248.0 06/13/2021   Lab Results  Component Value Date   NA 137 06/13/2021   K 5.0 06/13/2021   CO2 28 06/13/2021   GLUCOSE 91 06/13/2021   BUN 20 06/13/2021   CREATININE 0.55 06/13/2021   BILITOT 0.5 06/13/2021   ALKPHOS 33 (L) 06/13/2021   AST 17 06/13/2021   ALT 12 06/13/2021   PROT 7.2 06/13/2021   ALBUMIN 4.4 06/13/2021   CALCIUM 9.9 06/13/2021   GFR 95.52 06/13/2021   Lab Results  Component Value Date   CHOL 183 06/13/2021   Lab Results  Component Value Date   HDL 81.70 06/13/2021   Lab Results  Component Value Date   LDLCALC 80 06/13/2021   Lab Results  Component Value Date   TRIG 108.0 06/13/2021   Lab Results  Component Value Date   CHOLHDL 2 06/13/2021   Lab Results  Component Value Date   HGBA1C 5.5 01/11/2014       Assessment & Plan:   Problem List Items Addressed This Visit       Unprioritized   Pansinusitis - Primary   Relevant Medications   amoxicillin-clavulanate (AUGMENTIN) 875-125 MG tablet    I am having Sherri Hoffman start on amoxicillin-clavulanate. I am also having her maintain her Calcium Carbonate-Vitamin D, progesterone, estradiol, cholecalciferol, magnesium, fluticasone, levocetirizine, azelastine, valsartan-hydrochlorothiazide, and potassium chloride.  Meds ordered this encounter  Medications    amoxicillin-clavulanate (AUGMENTIN) 875-125 MG tablet    Sig: Take 1 tablet by mouth 2 (two) times daily.    Dispense:  20 tablet    Refill:  0    I discussed the assessment and treatment plan with the patient. The patient was provided an opportunity to ask questions and all were answered. The patient agreed with the plan and demonstrated an understanding of the instructions.   The patient was advised to call back or seek an in-person evaluation if the symptoms worsen or if the condition fails to improve as anticipated.    Ann Held, DO [facility] [phone] (phone) [phone] (fax)  [place]

## 2021-09-05 ENCOUNTER — Ambulatory Visit: Payer: Medicare Other

## 2021-09-19 ENCOUNTER — Other Ambulatory Visit: Payer: Self-pay

## 2021-09-19 ENCOUNTER — Ambulatory Visit (INDEPENDENT_AMBULATORY_CARE_PROVIDER_SITE_OTHER): Payer: Medicare Other | Admitting: Family Medicine

## 2021-09-19 ENCOUNTER — Encounter: Payer: Self-pay | Admitting: Family Medicine

## 2021-09-19 DIAGNOSIS — J0140 Acute pansinusitis, unspecified: Secondary | ICD-10-CM | POA: Diagnosis not present

## 2021-09-19 MED ORDER — LEVOFLOXACIN 500 MG PO TABS: 500.0000 mg | ORAL_TABLET | Freq: Every day | ORAL | 0 refills | Status: AC

## 2021-09-19 MED ORDER — METHYLPREDNISOLONE ACETATE 80 MG/ML IJ SUSP
80.0000 mg | Freq: Once | INTRAMUSCULAR | Status: AC
  Administered 2021-09-19: 80 mg via INTRAMUSCULAR

## 2021-09-19 MED ORDER — AZELASTINE HCL 0.1 % NA SOLN: 2.0000 | Freq: Two times a day (BID) | NASAL | 12 refills | Status: DC

## 2021-09-19 NOTE — Patient Instructions (Signed)

## 2021-09-19 NOTE — Progress Notes (Signed)
Subjective:   By signing my name below, I, Sherri Hoffman, attest that this documentation has been prepared under the direction and in the presence of Dr. Roma Schanz, DO. 09/19/2021      Patient ID: Sherri Hoffman, female    DOB: 30-Nov-1955, 65 y.o.   MRN: [id number]  Chief Complaint  Patient presents with   Ear Fullness   Nasal Congestion    Going on a month    Ear Fullness  Associated symptoms include coughing. Pertinent negatives include no abdominal pain, headaches, rash or sore throat.  Patient is in today for a office visit.   She complains of ear fullness, drainage into throat, cough, and nasal congestion for the past month. Her cough is worse when going to sleep at night. She is taking Xyzal and Flonase nasal spray to manage her congestion and cough. She tried taking zyrtec to manage her symptoms but had difficulty falling asleep while taking it. She reports previously completing Augmentin when her symptoms first presented and found her symptoms improved but never completely resolved. She reports her symptoms worsened while traveling on a plane to [place]. She denies having any fevers. She has no taken a Covid-19 test recently. She reports taking a Covid-19 test when her symptoms first presented a month ago and tested negative. She prefers taking prednisone injections over pills.    Past Medical History:  Diagnosis Date   Hypertension    Skin cancer, basal cell     Past Surgical History:  Procedure Laterality Date   MYOMECTOMY      Family History  Problem Relation Age of Onset   Heart disease Mother    Diabetes Mother    Hyperlipidemia Mother    COPD Mother    Cancer Father        melanoma   Heart disease Father        chf   COPD Father    Skin cancer Other        Melanoma   Coronary artery disease Other    COPD Other    Cancer Other        skin, melanoma   Depression Brother     Social History   Socioeconomic History   Marital status:  Married    Spouse name: Not on file   Number of children: Not on file   Years of education: Not on file   Highest education level: Not on file  Occupational History   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  Tobacco Use   Smoking status: Never   Smokeless tobacco: Never  Substance and Sexual Activity   Alcohol use: Yes    Alcohol/week: 3.0 standard drinks    Types: 3 Glasses of wine per week   Drug use: No   Sexual activity: Yes    Partners: Male    Birth control/protection: Post-menopausal  Other Topics Concern   Not on file  Social History Narrative   Not on file   Social Determinants of Health   Financial Resource Strain: Not on file  Food Insecurity: Not on file  Transportation Needs: Not on file  Physical Activity: Not on file  Stress: Not on file  Social Connections: Not on file  Intimate Partner Violence: Not on file    Outpatient Medications Prior to Visit  Medication Sig Dispense Refill   Calcium Carbonate-Vitamin D 500-125 MG-UNIT TABS Take by mouth.     cholecalciferol (VITAMIN D) 1000 units tablet Take 2,000 Units by mouth  daily.      estradiol (ESTRACE) 1 MG tablet Take 1 tablet (1 mg total) by mouth daily. 90 tablet 3   fluticasone (FLONASE) 50 MCG/ACT nasal spray SHAKE LIQUID AND USE 2 SPRAYS IN EACH NOSTRIL DAILY 16 g 6   levocetirizine (XYZAL) 5 MG tablet TAKE 1 TABLET(5 MG) BY MOUTH DAILY AS NEEDED 90 tablet 1   potassium chloride (KLOR-CON) 10 MEQ tablet TAKE 1 TABLET(10 MEQ) BY MOUTH DAILY 90 tablet 1   progesterone (PROMETRIUM) 100 MG capsule Take 1 capsule (100 mg total) by mouth daily. 90 capsule 4   valsartan-hydrochlorothiazide (DIOVAN-HCT) 320-25 MG tablet TAKE 1 TABLET BY MOUTH DAILY 90 tablet 1   azelastine (ASTELIN) 0.1 % nasal spray Place 2 sprays into both nostrils 2 (two) times daily. Use in each nostril as directed 30 mL 12   magnesium 30 MG tablet Take 30 mg by mouth 2 (two) times daily. (Patient not taking: Reported on 09/19/2021)      amoxicillin-clavulanate (AUGMENTIN) 875-125 MG tablet Take 1 tablet by mouth 2 (two) times daily. 20 tablet 0   No facility-administered medications prior to visit.    Allergies  Allergen Reactions   Ace Inhibitors     REACTION: COUGH   Erythromycin     REACTION: NAUSEA   Hydrocodone Bit-Homatrop Mbr     Nausea/vomitting   Tussionex Pennkinetic Er [Hydrocod Polst-Cpm Polst Er]     nausea    Review of Systems  Constitutional:  Negative for fever and malaise/fatigue.  HENT:  Positive for congestion and sinus pain. Negative for sore throat.        (+)drainage into throat (+)ear fullness  Eyes:  Negative for blurred vision.  Respiratory:  Positive for cough. Negative for shortness of breath.   Cardiovascular:  Negative for chest pain, palpitations and leg swelling.  Gastrointestinal:  Negative for abdominal pain, blood in stool and nausea.  Genitourinary:  Negative for dysuria and frequency.  Musculoskeletal:  Negative for falls.  Skin:  Negative for rash.  Neurological:  Negative for dizziness, loss of consciousness and headaches.  Endo/Heme/Allergies:  Negative for environmental allergies.  Psychiatric/Behavioral:  Negative for depression. The patient is not nervous/anxious.       Objective:    Physical Exam Vitals and nursing note reviewed.  Constitutional:      General: She is not in acute distress.    Appearance: Normal appearance. She is not ill-appearing.  HENT:     Head: Normocephalic and atraumatic.     Right Ear: External ear normal.     Left Ear: External ear normal.     Ears:     Comments: Fluid behind right TM Dull left TM    Nose: Mucosal edema, congestion and rhinorrhea present. No nasal deformity.     Right Sinus: Maxillary sinus tenderness and frontal sinus tenderness present.     Left Sinus: Maxillary sinus tenderness and frontal sinus tenderness present.     Mouth/Throat:     Pharynx: No oropharyngeal exudate.  Eyes:     Extraocular Movements:  Extraocular movements intact.     Pupils: Pupils are equal, round, and reactive to light.  Cardiovascular:     Rate and Rhythm: Normal rate and regular rhythm.     Heart sounds: Normal heart sounds. No murmur heard.   No gallop.  Pulmonary:     Effort: Pulmonary effort is normal. No respiratory distress.     Breath sounds: Normal breath sounds. No wheezing or rales.  Musculoskeletal:  Cervical back: Normal range of motion and neck supple.  Lymphadenopathy:     Cervical: No cervical adenopathy.  Skin:    General: Skin is warm and dry.  Neurological:     Mental Status: She is alert and oriented to person, place, and time.  Psychiatric:        Behavior: Behavior normal.        Judgment: Judgment normal.    BP (!) 156/76   Pulse 65   Temp 98.1 F (36.7 C) (Oral)   Ht 5\' 6"  (1.676 m)   Wt 123 lb 3.2 oz (55.9 kg)   SpO2 97%   BMI 19.89 kg/m  Wt Readings from Last 3 Encounters:  09/19/21 123 lb 3.2 oz (55.9 kg)  06/13/21 122 lb 6.4 oz (55.5 kg)  01/30/21 125 lb 12.8 oz (57.1 kg)    Diabetic Foot Exam - Simple   No data filed    Lab Results  Component Value Date   WBC 4.5 06/13/2021   HGB 13.1 06/13/2021   HCT 39.0 06/13/2021   PLT 248.0 06/13/2021   GLUCOSE 91 06/13/2021   CHOL 183 06/13/2021   TRIG 108.0 06/13/2021   HDL 81.70 06/13/2021   LDLCALC 80 06/13/2021   ALT 12 06/13/2021   AST 17 06/13/2021   NA 137 06/13/2021   K 5.0 06/13/2021   CL 101 06/13/2021   CREATININE 0.55 06/13/2021   BUN 20 06/13/2021   CO2 28 06/13/2021   TSH 1.64 06/01/2019   HGBA1C 5.5 01/11/2014   MICROALBUR 0.9 04/18/2015    Lab Results  Component Value Date   TSH 1.64 06/01/2019   Lab Results  Component Value Date   WBC 4.5 06/13/2021   HGB 13.1 06/13/2021   HCT 39.0 06/13/2021   MCV 93.5 06/13/2021   PLT 248.0 06/13/2021   Lab Results  Component Value Date   NA 137 06/13/2021   K 5.0 06/13/2021   CO2 28 06/13/2021   GLUCOSE 91 06/13/2021   BUN 20 06/13/2021    CREATININE 0.55 06/13/2021   BILITOT 0.5 06/13/2021   ALKPHOS 33 (L) 06/13/2021   AST 17 06/13/2021   ALT 12 06/13/2021   PROT 7.2 06/13/2021   ALBUMIN 4.4 06/13/2021   CALCIUM 9.9 06/13/2021   GFR 95.52 06/13/2021   Lab Results  Component Value Date   CHOL 183 06/13/2021   Lab Results  Component Value Date   HDL 81.70 06/13/2021   Lab Results  Component Value Date   LDLCALC 80 06/13/2021   Lab Results  Component Value Date   TRIG 108.0 06/13/2021   Lab Results  Component Value Date   CHOLHDL 2 06/13/2021   Lab Results  Component Value Date   HGBA1C 5.5 01/11/2014       Assessment & Plan:   Problem List Items Addressed This Visit       Unprioritized   Pansinusitis   Relevant Medications   azelastine (ASTELIN) 0.1 % nasal spray   levofloxacin (LEVAQUIN) 500 MG tablet     Meds ordered this encounter  Medications   azelastine (ASTELIN) 0.1 % nasal spray    Sig: Place 2 sprays into both nostrils 2 (two) times daily. Use in each nostril as directed    Dispense:  30 mL    Refill:  12   levofloxacin (LEVAQUIN) 500 MG tablet    Sig: Take 1 tablet (500 mg total) by mouth daily for 7 days.    Dispense:  7 tablet  Refill:  0   methylPREDNISolone acetate (DEPO-MEDROL) injection 80 mg    I, Dr. Roma Schanz, DO, personally preformed the services described in this documentation.  All medical record entries made by the scribe were at my direction and in my presence.  I have reviewed the chart and discharge instructions (if applicable) and agree that the record reflects my personal performance and is accurate and complete. 09/19/2021   I,Sherri Hoffman,acting as a scribe for Ann Held, DO.,have documented all relevant documentation on the behalf of Ann Held, DO,as directed by  Ann Held, DO while in the presence of Ann Held, DO.   Ann Held, DO

## 2021-09-20 NOTE — Assessment & Plan Note (Signed)
abx per orders  con't nasal sprays  Depo medrol IM rto prn

## 2021-10-02 DIAGNOSIS — H524 Presbyopia: Secondary | ICD-10-CM | POA: Diagnosis not present

## 2021-10-02 DIAGNOSIS — H5213 Myopia, bilateral: Secondary | ICD-10-CM | POA: Diagnosis not present

## 2021-10-02 DIAGNOSIS — Z135 Encounter for screening for eye and ear disorders: Secondary | ICD-10-CM | POA: Diagnosis not present

## 2021-10-02 DIAGNOSIS — H52223 Regular astigmatism, bilateral: Secondary | ICD-10-CM | POA: Diagnosis not present

## 2021-10-03 ENCOUNTER — Ambulatory Visit: Payer: Medicare Other

## 2021-10-11 ENCOUNTER — Other Ambulatory Visit: Payer: Self-pay | Admitting: Family Medicine

## 2021-10-11 DIAGNOSIS — I10 Essential (primary) hypertension: Secondary | ICD-10-CM

## 2021-10-26 ENCOUNTER — Encounter: Payer: Self-pay | Admitting: Family Medicine

## 2021-10-26 ENCOUNTER — Telehealth (INDEPENDENT_AMBULATORY_CARE_PROVIDER_SITE_OTHER): Payer: Medicare Other | Admitting: Family Medicine

## 2021-10-26 DIAGNOSIS — R0981 Nasal congestion: Secondary | ICD-10-CM

## 2021-10-26 DIAGNOSIS — R059 Cough, unspecified: Secondary | ICD-10-CM | POA: Diagnosis not present

## 2021-10-26 MED ORDER — BENZONATATE 100 MG PO CAPS: ORAL_CAPSULE | ORAL | 0 refills | Status: DC

## 2021-10-26 NOTE — Patient Instructions (Signed)
°  HOME CARE TIPS:  -COVID19 testing information: [email or web address].[email or web address]  Most pharmacies also offer testing and home test kits. If the Covid19 test is positive and you desire antiviral treatment, please contact a [facility] or schedule a follow up virtual visit through your primary care office or through the [facility].  Other test to treat options: ConnectRV.is?[email or web address]  -I sent the medication(s) we discussed to your pharmacy: Meds ordered this encounter  Medications   benzonatate (TESSALON PERLES) 100 MG capsule    Sig: 1-2 capsules up to twice daily as needed for cough    Dispense:  30 capsule    Refill:  0    -can use tylenol or aleve if needed for fevers, aches and pains per instructions  -can use nasal saline a few times per day if you have nasal congestion; sometimes  a short course of Afrin nasal spray for 3 days can help with symptoms as well  -stay hydrated, drink plenty of fluids and eat small healthy meals - avoid dairy  -can take 1000 IU (68mcg) Vit D3 and 100-500 mg of Vit C daily per instructions   -follow up with your doctor in 2-3 days unless improving and feeling better  -stay home while sick, except to seek medical care. If you have COVID19, you will likely be contagious for 7-10 days. Flu or Influenza is likely contagious for about 7 days. Other respiratory viral infections remain contagious for 5-10+ days depending on the virus and many other factors. Wearing a good mask that fits snugly (such as N95 or KN95) if around others can reduce the risk of transmission.  It was nice to meet you today, and I really hope you are feeling better soon. I help [facility] out with telemedicine visits on Tuesdays and Thursdays and am happy to help if you need a follow up virtual visit on those days. Otherwise, if you have any concerns or questions following this visit please schedule a follow up visit with your  Primary Care doctor or seek care at a local urgent care clinic to avoid delays in care.    Seek in person care or schedule a follow up video visit promptly if your symptoms worsen, new concerns arise or you are not improving with treatment. Call 911 and/or seek emergency care if your symptoms are severe or life threatening.

## 2021-10-26 NOTE — Progress Notes (Signed)
Virtual Visit via Video Note  I connected with Coralyn  on 10/26/21 at  3:40 PM EST by a video enabled telemedicine application and verified that I am speaking with the correct person using two identifiers.  Location patient: home, [place] Location provider:work or home office Persons participating in the virtual visit: patient, provider  I discussed the limitations of evaluation and management by telemedicine and the availability of in person appointments. The patient expressed understanding and agreed to proceed.   HPI:  Acute telemedicine visit for cough and flu likes symptoms: -Onset: 2-3 days ago -Symptoms include: cough, sinus congestion, body aches, pnd -Denies: fever, CP, SOB, NVD, known sick contacts -covid test negative this morning -Pertinent past medical history: prone to sinus issues -Pertinent medication allergies:  Allergies  Allergen Reactions   Ace Inhibitors     REACTION: COUGH   Erythromycin     REACTION: NAUSEA   Hydrocodone Bit-Homatrop Mbr     Nausea/vomitting   Tussionex Pennkinetic Er [Hydrocod Polst-Cpm Polst Er]     nausea  -COVID-19 vaccine status:  Immunization History  Administered Date(s) Administered   Influenza Whole 08/09/2010, 08/30/2011, 08/26/2012   Influenza,inj,Quad PF,6+ Mos 08/12/2019   Influenza-Unspecified 08/12/2013, 08/12/2020   Moderna Sars-Covid-2 Vaccination 12/17/2019, 01/14/2020   PNEUMOCOCCAL CONJUGATE-20 06/13/2021   Td 03/18/2007   Zoster, Live 04/18/2015     ROS: See pertinent positives and negatives per HPI.  Past Medical History:  Diagnosis Date   Hypertension    Skin cancer, basal cell     Past Surgical History:  Procedure Laterality Date   MYOMECTOMY       Current Outpatient Medications:    azelastine (ASTELIN) 0.1 % nasal spray, Place 2 sprays into both nostrils 2 (two) times daily. Use in each nostril as directed, Disp: 30 mL, Rfl: 12   benzonatate (TESSALON PERLES) 100 MG capsule, 1-2 capsules up to twice  daily as needed for cough, Disp: 30 capsule, Rfl: 0   Calcium Carbonate-Vitamin D 500-125 MG-UNIT TABS, Take by mouth., Disp: , Rfl:    cholecalciferol (VITAMIN D) 1000 units tablet, Take 2,000 Units by mouth daily. , Disp: , Rfl:    estradiol (ESTRACE) 1 MG tablet, Take 1 tablet (1 mg total) by mouth daily., Disp: 90 tablet, Rfl: 3   fluticasone (FLONASE) 50 MCG/ACT nasal spray, SHAKE LIQUID AND USE 2 SPRAYS IN EACH NOSTRIL DAILY, Disp: 16 g, Rfl: 6   levocetirizine (XYZAL) 5 MG tablet, TAKE 1 TABLET(5 MG) BY MOUTH DAILY AS NEEDED, Disp: 90 tablet, Rfl: 1   potassium chloride (KLOR-CON M) 10 MEQ tablet, TAKE 1 TABLET(10 MEQ) BY MOUTH DAILY, Disp: 90 tablet, Rfl: 1   progesterone (PROMETRIUM) 100 MG capsule, Take 1 capsule (100 mg total) by mouth daily., Disp: 90 capsule, Rfl: 4   valsartan-hydrochlorothiazide (DIOVAN-HCT) 320-25 MG tablet, TAKE 1 TABLET BY MOUTH DAILY, Disp: 90 tablet, Rfl: 1  EXAM:  VITALS per patient if applicable:  GENERAL: alert, oriented, appears well and in no acute distress  HEENT: atraumatic, conjunttiva clear, no obvious abnormalities on inspection of external nose and ears  NECK: normal movements of the head and neck  LUNGS: on inspection no signs of respiratory distress, breathing rate appears normal, no obvious gross SOB, gasping or wheezing  CV: no obvious cyanosis  MS: moves all visible extremities without noticeable abnormality  PSYCH/NEURO: pleasant and cooperative, no obvious depression or anxiety, speech and thought processing grossly intact  ASSESSMENT AND PLAN:  Discussed the following assessment and plan:  Cough, unspecified  type  Nasal congestion  -we discussed possible serious and likely etiologies, options for evaluation and workup, limitations of telemedicine visit vs in person visit, treatment, treatment risks and precautions. Pt is agreeable to treatment via telemedicine at this moment. Query VURI, influenza, covid19 vs other. Opted for  Tessalon rx for cough, other symptomatic care measures and retesting for covid per patient instructions.  Work/School slipped offered: declined Advise to seek prompt VV of in person care if worsening, new symptoms arise, or if is not improving with treatment.  I discussed the assessment and treatment plan with the patient. The patient was provided an opportunity to ask questions and all were answered. The patient agreed with the plan and demonstrated an understanding of the instructions.     Lucretia Kern, DO

## 2021-10-27 ENCOUNTER — Telehealth: Payer: Self-pay | Admitting: Family Medicine

## 2021-10-27 MED ORDER — AZITHROMYCIN 250 MG PO TABS
ORAL_TABLET | ORAL | 0 refills | Status: DC
Start: 2021-10-27 — End: 2021-12-14

## 2021-10-27 MED ORDER — PROMETHAZINE-DM 6.25-15 MG/5ML PO SYRP: 5.0000 mL | ORAL_SOLUTION | Freq: Every day | ORAL | 0 refills | Status: DC

## 2021-10-27 NOTE — Telephone Encounter (Signed)
Patient had VV with Rodena Goldmann on 10/26/21 and states the tessalon pearls has not helped her at all. She's had a terrible cough all night and she is scared it might be bronchitis. Please advice.

## 2021-10-27 NOTE — Telephone Encounter (Signed)
Meds sent

## 2021-10-30 ENCOUNTER — Ambulatory Visit: Payer: Medicare Other | Admitting: Family Medicine

## 2021-11-27 ENCOUNTER — Ambulatory Visit: Payer: Medicare Other | Admitting: Family Medicine

## 2021-11-30 DIAGNOSIS — M25562 Pain in left knee: Secondary | ICD-10-CM | POA: Diagnosis not present

## 2021-12-12 DIAGNOSIS — S83242A Other tear of medial meniscus, current injury, left knee, initial encounter: Secondary | ICD-10-CM | POA: Diagnosis not present

## 2021-12-12 DIAGNOSIS — S83412A Sprain of medial collateral ligament of left knee, initial encounter: Secondary | ICD-10-CM | POA: Diagnosis not present

## 2021-12-12 DIAGNOSIS — M23362 Other meniscus derangements, other lateral meniscus, left knee: Secondary | ICD-10-CM | POA: Diagnosis not present

## 2021-12-12 DIAGNOSIS — S83422A Sprain of lateral collateral ligament of left knee, initial encounter: Secondary | ICD-10-CM | POA: Diagnosis not present

## 2021-12-14 ENCOUNTER — Other Ambulatory Visit: Payer: Self-pay | Admitting: Family Medicine

## 2021-12-14 ENCOUNTER — Ambulatory Visit (INDEPENDENT_AMBULATORY_CARE_PROVIDER_SITE_OTHER): Payer: Medicare Other | Admitting: Family Medicine

## 2021-12-14 ENCOUNTER — Encounter: Payer: Self-pay | Admitting: Family Medicine

## 2021-12-14 ENCOUNTER — Other Ambulatory Visit: Payer: Self-pay

## 2021-12-14 ENCOUNTER — Ambulatory Visit (HOSPITAL_BASED_OUTPATIENT_CLINIC_OR_DEPARTMENT_OTHER)
Admission: RE | Admit: 2021-12-14 | Discharge: 2021-12-14 | Disposition: A | Discharge status: Home / Self Care | Payer: Medicare Other | Source: Ambulatory Visit | Attending: Family Medicine | Admitting: Family Medicine

## 2021-12-14 VITALS — BP 114/80 | HR 79 | Temp 97.8°F | Resp 18 | Ht 66.0 in | Wt 123.6 lb

## 2021-12-14 DIAGNOSIS — I10 Essential (primary) hypertension: Secondary | ICD-10-CM

## 2021-12-14 DIAGNOSIS — R053 Chronic cough: Secondary | ICD-10-CM | POA: Insufficient documentation

## 2021-12-14 DIAGNOSIS — J301 Allergic rhinitis due to pollen: Secondary | ICD-10-CM

## 2021-12-14 DIAGNOSIS — R059 Cough, unspecified: Secondary | ICD-10-CM | POA: Diagnosis not present

## 2021-12-14 LAB — COMPREHENSIVE METABOLIC PANEL
ALT: 13 U/L (ref 0–35)
AST: 18 U/L (ref 0–37)
Albumin: 4.4 g/dL (ref 3.5–5.2)
Alkaline Phosphatase: 35 U/L — ABNORMAL LOW (ref 39–117)
BUN: 17 mg/dL (ref 6–23)
CO2: 30 mEq/L (ref 19–32)
Calcium: 9.8 mg/dL (ref 8.4–10.5)
Chloride: 102 mEq/L (ref 96–112)
Creatinine, Ser: 0.56 mg/dL (ref 0.40–1.20)
GFR: 94.77 mL/min (ref >60.00)
Glucose, Bld: 91 mg/dL (ref 70–99)
Potassium: 4.5 mEq/L (ref 3.5–5.1)
Sodium: 139 mEq/L (ref 135–145)
Total Bilirubin: 0.7 mg/dL (ref 0.2–1.2)
Total Protein: 7.2 g/dL (ref 6.0–8.3)

## 2021-12-14 LAB — LIPID PANEL
Cholesterol: 203 mg/dL — ABNORMAL HIGH (ref 0–200)
HDL: 87.4 mg/dL (ref >39.00)
LDL Cholesterol: 92 mg/dL (ref 0–99)
NonHDL: 115.95
Total CHOL/HDL Ratio: 2
Triglycerides: 121 mg/dL (ref 0.0–149.0)
VLDL: 24.2 mg/dL (ref 0.0–40.0)

## 2021-12-14 MED ORDER — LEVOCETIRIZINE DIHYDROCHLORIDE 5 MG PO TABS: ORAL_TABLET | ORAL | 1 refills | Status: DC

## 2021-12-14 MED ORDER — ALBUTEROL SULFATE HFA 108 (90 BASE) MCG/ACT IN AERS: 2.0000 | INHALATION_SPRAY | Freq: Four times a day (QID) | RESPIRATORY_TRACT | 0 refills | Status: DC | PRN

## 2021-12-14 NOTE — Assessment & Plan Note (Signed)
Well controlled, no changes to meds. Encouraged heart healthy diet such as the DASH diet and exercise as tolerated.  °

## 2021-12-14 NOTE — Patient Instructions (Signed)

## 2021-12-14 NOTE — Assessment & Plan Note (Signed)
Check xray  Albuterol sent in

## 2021-12-14 NOTE — Progress Notes (Signed)
Established Patient Office Visit  Subjective:  Patient ID: Sherri Hoffman, female    DOB: 1956/09/09  Age: 66 y.o. MRN: [id number]  CC:  Chief Complaint  Patient presents with   Hypertension   Follow-up    HPI Sherri Hoffman presents for f/u bp and she c/o cont cough since christmas and wheezing   she was on a z pak and then doxycycline.  Mucinex dm helped but she thinks it made her bp high   Past Medical History:  Diagnosis Date   Hypertension    Skin cancer, basal cell     Past Surgical History:  Procedure Laterality Date   MYOMECTOMY      Family History  Problem Relation Age of Onset   Heart disease Mother    Diabetes Mother    Hyperlipidemia Mother    COPD Mother    Cancer Father        melanoma   Heart disease Father        chf   COPD Father    Skin cancer Other        Melanoma   Coronary artery disease Other    COPD Other    Cancer Other        skin, melanoma   Depression Brother     Social History   Socioeconomic History   Marital status: Married    Spouse name: Not on file   Number of children: Not on file   Years of education: Not on file   Highest education level: Not on file  Occupational History   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  Tobacco Use   Smoking status: Never   Smokeless tobacco: Never  Substance and Sexual Activity   Alcohol use: Yes    Alcohol/week: 3.0 standard drinks    Types: 3 Glasses of wine per week   Drug use: No   Sexual activity: Yes    Partners: Male    Birth control/protection: Post-menopausal  Other Topics Concern   Not on file  Social History Narrative   Not on file   Social Determinants of Health   Financial Resource Strain: Not on file  Food Insecurity: Not on file  Transportation Needs: Not on file  Physical Activity: Not on file  Stress: Not on file  Social Connections: Not on file  Intimate Partner Violence: Not on file    Outpatient Medications  Prior to Visit  Medication Sig Dispense Refill   azelastine (ASTELIN) 0.1 % nasal spray Place 2 sprays into both nostrils 2 (two) times daily. Use in each nostril as directed 30 mL 12   Calcium Carbonate-Vitamin D 500-125 MG-UNIT TABS Take by mouth.     cholecalciferol (VITAMIN D) 1000 units tablet Take 2,000 Units by mouth daily.      estradiol (ESTRACE) 1 MG tablet Take 1 tablet (1 mg total) by mouth daily. 90 tablet 3   fluticasone (FLONASE) 50 MCG/ACT nasal spray SHAKE LIQUID AND USE 2 SPRAYS IN EACH NOSTRIL DAILY 16 g 6   potassium chloride (KLOR-CON M) 10 MEQ tablet TAKE 1 TABLET(10 MEQ) BY MOUTH DAILY 90 tablet 1   progesterone (PROMETRIUM) 100 MG capsule Take 1 capsule (100 mg total) by mouth daily. 90 capsule 4   valsartan-hydrochlorothiazide (DIOVAN-HCT) 320-25 MG tablet TAKE 1 TABLET BY MOUTH DAILY 90 tablet 1   levocetirizine (XYZAL) 5 MG tablet TAKE 1 TABLET(5 MG) BY MOUTH DAILY AS NEEDED 90 tablet 1   azithromycin (ZITHROMAX) 250 MG  tablet Use as directed (Patient not taking: Reported on 12/14/2021) 6 tablet 0   benzonatate (TESSALON PERLES) 100 MG capsule 1-2 capsules up to twice daily as needed for cough (Patient not taking: Reported on 12/14/2021) 30 capsule 0   promethazine-dextromethorphan (PROMETHAZINE-DM) 6.25-15 MG/5ML syrup Take 5 mLs by mouth at bedtime. (Patient not taking: Reported on 12/14/2021) 118 mL 0   No facility-administered medications prior to visit.    Allergies  Allergen Reactions   Ace Inhibitors     REACTION: COUGH   Erythromycin     REACTION: NAUSEA   Hydrocodone Bit-Homatrop Mbr     Nausea/vomitting   Tussionex Pennkinetic Er [Hydrocod Poli-Chlorphe Poli Er]     nausea    ROS Review of Systems  Constitutional:  Negative for activity change, appetite change, fatigue and unexpected weight change.  Respiratory:  Negative for cough and shortness of breath.   Cardiovascular:  Negative for chest pain and palpitations.   Psychiatric/Behavioral:  Negative for behavioral problems and dysphoric mood. The patient is not nervous/anxious.      Objective:    Physical Exam Vitals and nursing note reviewed.  Constitutional:      Appearance: She is well-developed.  HENT:     Head: Normocephalic and atraumatic.  Eyes:     Conjunctiva/sclera: Conjunctivae normal.  Neck:     Thyroid: No thyromegaly.     Vascular: No carotid bruit or JVD.  Cardiovascular:     Rate and Rhythm: Normal rate and regular rhythm.     Heart sounds: Normal heart sounds. No murmur heard. Pulmonary:     Effort: Pulmonary effort is normal. No respiratory distress.     Breath sounds: Wheezing present. No rales.     Comments: + exp wheeze upper L lung  Chest:     Chest wall: No tenderness.  Musculoskeletal:     Cervical back: Normal range of motion and neck supple.  Neurological:     Mental Status: She is alert and oriented to person, place, and time.    BP 114/80 (BP Location: Right Arm, Patient Position: Sitting, Cuff Size: Normal)    Pulse 79    Temp 97.8 F (36.6 C) (Oral)    Resp 18    Ht 5\' 6"  (1.676 m)    Wt 123 lb 9.6 oz (56.1 kg)    SpO2 100%    BMI 19.95 kg/m  Wt Readings from Last 3 Encounters:  12/14/21 123 lb 9.6 oz (56.1 kg)  09/19/21 123 lb 3.2 oz (55.9 kg)  06/13/21 122 lb 6.4 oz (55.5 kg)     Health Maintenance Due  Topic Date Due   Zoster Vaccines- Shingrix (1 of 2) Never done   TETANUS/TDAP  03/17/2017   DEXA SCAN  Never done   COVID-19 Vaccine (3 - Moderna risk series) 02/11/2020    There are no preventive care reminders to display for this patient.  Lab Results  Component Value Date   TSH 1.64 06/01/2019   Lab Results  Component Value Date   WBC 4.5 06/13/2021   HGB 13.1 06/13/2021   HCT 39.0 06/13/2021   MCV 93.5 06/13/2021   PLT 248.0 06/13/2021   Lab Results  Component Value Date   NA 137 06/13/2021   K 5.0 06/13/2021   CO2 28 06/13/2021   GLUCOSE 91 06/13/2021   BUN 20  06/13/2021   CREATININE 0.55 06/13/2021   BILITOT 0.5 06/13/2021   ALKPHOS 33 (L) 06/13/2021   AST 17 06/13/2021   ALT  12 06/13/2021   PROT 7.2 06/13/2021   ALBUMIN 4.4 06/13/2021   CALCIUM 9.9 06/13/2021   GFR 95.52 06/13/2021   Lab Results  Component Value Date   CHOL 183 06/13/2021   Lab Results  Component Value Date   HDL 81.70 06/13/2021   Lab Results  Component Value Date   LDLCALC 80 06/13/2021   Lab Results  Component Value Date   TRIG 108.0 06/13/2021   Lab Results  Component Value Date   CHOLHDL 2 06/13/2021   Lab Results  Component Value Date   HGBA1C 5.5 01/11/2014      Assessment & Plan:   Problem List Items Addressed This Visit       Unprioritized   Primary hypertension - Primary   Relevant Orders   Comprehensive metabolic panel   Lipid panel   Chronic cough    Check xray  Albuterol sent in       Relevant Medications   albuterol (VENTOLIN HFA) 108 (90 Base) MCG/ACT inhaler   Other Relevant Orders   DG Chest 2 View   Essential hypertension    Well controlled, no changes to meds. Encouraged heart healthy diet such as the DASH diet and exercise as tolerated.       Other Visit Diagnoses     Seasonal allergic rhinitis due to pollen       Relevant Medications   levocetirizine (XYZAL) 5 MG tablet   Other Relevant Orders   Comprehensive metabolic panel   Lipid panel       Meds ordered this encounter  Medications   levocetirizine (XYZAL) 5 MG tablet    Sig: TAKE 1 TABLET(5 MG) BY MOUTH DAILY AS NEEDED    Dispense:  90 tablet    Refill:  1   albuterol (VENTOLIN HFA) 108 (90 Base) MCG/ACT inhaler    Sig: Inhale 2 puffs into the lungs every 6 (six) hours as needed for wheezing or shortness of breath.    Dispense:  8 g    Refill:  0    Follow-up: Return in about 6 months (around 06/13/2022), or if symptoms worsen or fail to improve, for fasting, annual exam.    Ann Held, DO

## 2021-12-20 ENCOUNTER — Encounter: Payer: Self-pay | Admitting: Family Medicine

## 2021-12-22 DIAGNOSIS — M25562 Pain in left knee: Secondary | ICD-10-CM | POA: Diagnosis not present

## 2021-12-22 DIAGNOSIS — S83242A Other tear of medial meniscus, current injury, left knee, initial encounter: Secondary | ICD-10-CM | POA: Diagnosis not present

## 2022-03-06 DIAGNOSIS — G8918 Other acute postprocedural pain: Secondary | ICD-10-CM | POA: Diagnosis not present

## 2022-03-06 DIAGNOSIS — M948X6 Other specified disorders of cartilage, lower leg: Secondary | ICD-10-CM | POA: Diagnosis not present

## 2022-03-06 DIAGNOSIS — M23222 Derangement of posterior horn of medial meniscus due to old tear or injury, left knee: Secondary | ICD-10-CM | POA: Diagnosis not present

## 2022-03-06 DIAGNOSIS — M238X2 Other internal derangements of left knee: Secondary | ICD-10-CM | POA: Diagnosis not present

## 2022-03-06 DIAGNOSIS — S83242A Other tear of medial meniscus, current injury, left knee, initial encounter: Secondary | ICD-10-CM | POA: Diagnosis not present

## 2022-03-06 DIAGNOSIS — M958 Other specified acquired deformities of musculoskeletal system: Secondary | ICD-10-CM | POA: Diagnosis not present

## 2022-03-06 HISTORY — PX: KNEE ARTHROSCOPY: SUR90

## 2022-03-29 DIAGNOSIS — D225 Melanocytic nevi of trunk: Secondary | ICD-10-CM | POA: Diagnosis not present

## 2022-03-29 DIAGNOSIS — L989 Disorder of the skin and subcutaneous tissue, unspecified: Secondary | ICD-10-CM | POA: Diagnosis not present

## 2022-03-29 DIAGNOSIS — Z08 Encounter for follow-up examination after completed treatment for malignant neoplasm: Secondary | ICD-10-CM | POA: Diagnosis not present

## 2022-03-29 DIAGNOSIS — L814 Other melanin hyperpigmentation: Secondary | ICD-10-CM | POA: Diagnosis not present

## 2022-03-29 DIAGNOSIS — D0461 Carcinoma in situ of skin of right upper limb, including shoulder: Secondary | ICD-10-CM | POA: Diagnosis not present

## 2022-03-29 DIAGNOSIS — C44622 Squamous cell carcinoma of skin of right upper limb, including shoulder: Secondary | ICD-10-CM | POA: Diagnosis not present

## 2022-03-29 DIAGNOSIS — L821 Other seborrheic keratosis: Secondary | ICD-10-CM | POA: Diagnosis not present

## 2022-03-30 ENCOUNTER — Other Ambulatory Visit: Payer: Self-pay | Admitting: Family Medicine

## 2022-03-30 DIAGNOSIS — I10 Essential (primary) hypertension: Secondary | ICD-10-CM

## 2022-04-02 DIAGNOSIS — Z1231 Encounter for screening mammogram for malignant neoplasm of breast: Secondary | ICD-10-CM | POA: Diagnosis not present

## 2022-04-04 DIAGNOSIS — D485 Neoplasm of uncertain behavior of skin: Secondary | ICD-10-CM | POA: Diagnosis not present

## 2022-04-05 DIAGNOSIS — Z79890 Hormone replacement therapy (postmenopausal): Secondary | ICD-10-CM | POA: Diagnosis not present

## 2022-04-05 DIAGNOSIS — Z01419 Encounter for gynecological examination (general) (routine) without abnormal findings: Secondary | ICD-10-CM | POA: Diagnosis not present

## 2022-04-05 DIAGNOSIS — E559 Vitamin D deficiency, unspecified: Secondary | ICD-10-CM | POA: Diagnosis not present

## 2022-04-05 DIAGNOSIS — Z1211 Encounter for screening for malignant neoplasm of colon: Secondary | ICD-10-CM | POA: Diagnosis not present

## 2022-04-18 DIAGNOSIS — Z13820 Encounter for screening for osteoporosis: Secondary | ICD-10-CM | POA: Diagnosis not present

## 2022-05-30 ENCOUNTER — Telehealth: Payer: Self-pay | Admitting: Family Medicine

## 2022-05-30 NOTE — Telephone Encounter (Signed)
Left message for patient to call back and schedule Medicare Annual Wellness Visit (AWV).   Please offer to do virtually or by telephone.  Left office number and my jabber [phone].  Welcome to Medicare: 06/09/2020  Please schedule at anytime with Nurse Health Advisor.

## 2022-06-16 ENCOUNTER — Other Ambulatory Visit: Payer: Self-pay | Admitting: Family Medicine

## 2022-06-16 DIAGNOSIS — J301 Allergic rhinitis due to pollen: Secondary | ICD-10-CM

## 2022-06-29 ENCOUNTER — Ambulatory Visit (INDEPENDENT_AMBULATORY_CARE_PROVIDER_SITE_OTHER): Payer: Medicare Other | Admitting: Family Medicine

## 2022-06-29 ENCOUNTER — Encounter: Payer: Self-pay | Admitting: Family Medicine

## 2022-06-29 VITALS — BP 142/70 | HR 67 | Temp 97.6°F | Resp 18 | Ht 66.0 in | Wt 121.6 lb

## 2022-06-29 DIAGNOSIS — I10 Essential (primary) hypertension: Secondary | ICD-10-CM

## 2022-06-29 DIAGNOSIS — J301 Allergic rhinitis due to pollen: Secondary | ICD-10-CM | POA: Diagnosis not present

## 2022-06-29 DIAGNOSIS — E559 Vitamin D deficiency, unspecified: Secondary | ICD-10-CM | POA: Diagnosis not present

## 2022-06-29 DIAGNOSIS — Z0000 Encounter for general adult medical examination without abnormal findings: Secondary | ICD-10-CM | POA: Diagnosis not present

## 2022-06-29 LAB — COMPREHENSIVE METABOLIC PANEL
ALT: 11 U/L (ref 0–35)
AST: 17 U/L (ref 0–37)
Albumin: 4.5 g/dL (ref 3.5–5.2)
Alkaline Phosphatase: 42 U/L (ref 39–117)
BUN: 25 mg/dL — ABNORMAL HIGH (ref 6–23)
CO2: 28 mEq/L (ref 19–32)
Calcium: 9.6 mg/dL (ref 8.4–10.5)
Chloride: 101 mEq/L (ref 96–112)
Creatinine, Ser: 0.65 mg/dL (ref 0.40–1.20)
GFR: 91.08 mL/min (ref >60.00)
Glucose, Bld: 93 mg/dL (ref 70–99)
Potassium: 4.6 mEq/L (ref 3.5–5.1)
Sodium: 139 mEq/L (ref 135–145)
Total Bilirubin: 0.4 mg/dL (ref 0.2–1.2)
Total Protein: 7.2 g/dL (ref 6.0–8.3)

## 2022-06-29 LAB — CBC WITH DIFFERENTIAL/PLATELET
Basophils Absolute: 0 10*3/uL (ref 0.0–0.1)
Basophils Relative: 0.9 % (ref 0.0–3.0)
Eosinophils Absolute: 0.1 10*3/uL (ref 0.0–0.7)
Eosinophils Relative: 3.1 % (ref 0.0–5.0)
HCT: 40.3 % (ref 36.0–46.0)
Hemoglobin: 13.3 g/dL (ref 12.0–15.0)
Lymphocytes Relative: 41.4 % (ref 12.0–46.0)
Lymphs Abs: 1.6 10*3/uL (ref 0.7–4.0)
MCHC: 32.9 g/dL (ref 30.0–36.0)
MCV: 91.9 fl (ref 78.0–100.0)
Monocytes Absolute: 0.4 10*3/uL (ref 0.1–1.0)
Monocytes Relative: 10.9 % (ref 3.0–12.0)
Neutro Abs: 1.7 10*3/uL (ref 1.4–7.7)
Neutrophils Relative %: 43.7 % (ref 43.0–77.0)
Platelets: 257 10*3/uL (ref 150.0–400.0)
RBC: 4.39 Mil/uL (ref 3.87–5.11)
RDW: 13 % (ref 11.5–15.5)
WBC: 3.9 10*3/uL — ABNORMAL LOW (ref 4.0–10.5)

## 2022-06-29 LAB — LIPID PANEL
Cholesterol: 194 mg/dL (ref 0–200)
HDL: 75.6 mg/dL (ref >39.00)
LDL Cholesterol: 84 mg/dL (ref 0–99)
NonHDL: 118.76
Total CHOL/HDL Ratio: 3
Triglycerides: 176 mg/dL — ABNORMAL HIGH (ref 0.0–149.0)
VLDL: 35.2 mg/dL (ref 0.0–40.0)

## 2022-06-29 LAB — TSH: TSH: 2.72 u[IU]/mL (ref 0.35–5.50)

## 2022-06-29 LAB — VITAMIN D 25 HYDROXY (VIT D DEFICIENCY, FRACTURES): VITD: 45.44 ng/mL (ref 30.00–100.00)

## 2022-06-29 NOTE — Progress Notes (Signed)
Subjective:     Sherri Hoffman is a 66 y.o. female and is here for a comprehensive physical exam. The patient reports no problems.  Social History   Socioeconomic History   Marital status: Married    Spouse name: Not on file   Number of children: Not on file   Years of education: Not on file   Highest education level: Not on file  Occupational History   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  Tobacco Use   Smoking status: Never   Smokeless tobacco: Never  Substance and Sexual Activity   Alcohol use: Yes    Alcohol/week: 3.0 standard drinks of alcohol    Types: 3 Glasses of wine per week   Drug use: No   Sexual activity: Yes    Partners: Male    Birth control/protection: Post-menopausal  Other Topics Concern   Not on file  Social History Narrative   Not on file   Social Determinants of Health   Financial Resource Strain: Not on file  Food Insecurity: Not on file  Transportation Needs: Not on file  Physical Activity: Not on file  Stress: Not on file  Social Connections: Not on file  Intimate Partner Violence: Not on file   Health Maintenance  Topic Date Due   Zoster Vaccines- Shingrix (1 of 2) Never done   TETANUS/TDAP  03/17/2017   DEXA SCAN  Never done   COVID-19 Vaccine (3 - Moderna risk series) 02/11/2020   INFLUENZA VACCINE  06/12/2022   MAMMOGRAM  03/29/2023   COLONOSCOPY (Pts 45-35yr Insurance coverage will need to be confirmed)  08/11/2028   Pneumonia Vaccine 66 Years old  Completed   Hepatitis C Screening  Completed   HPV VACCINES  Aged Out    The following portions of the patient's history were reviewed and updated as appropriate: allergies, current medications, past family history, past medical history, past social history, past surgical history, and problem list.  Review of Systems Review of Systems  Constitutional: Negative for activity change, appetite change and fatigue.  HENT: Negative for hearing loss, congestion, tinnitus and ear  discharge.  dentist [id number]: Negative for visual disturbance (see optho q1y -- vision corrected to 20/20 with glasses).  Respiratory: Negative for cough, chest tightness and shortness of breath.   Cardiovascular: Negative for chest pain, palpitations and leg swelling.  Gastrointestinal: Negative for abdominal pain, diarrhea, constipation and abdominal distention.  Genitourinary: Negative for urgency, frequency, decreased urine volume and difficulty urinating.  Musculoskeletal: Negative for back pain, arthralgias and gait problem.  Skin: Negative for color change, pallor and rash.  Neurological: Negative for dizziness, light-headedness, numbness and headaches.  Hematological: Negative for adenopathy. Does not bruise/bleed easily.  Psychiatric/Behavioral: Negative for suicidal ideas, confusion, sleep disturbance, self-injury, dysphoric mood, decreased concentration and agitation.     Objective:    BP (!) 142/70 (BP Location: Right Arm, Patient Position: Sitting, Cuff Size: Normal)   Pulse 67   Temp 97.6 F (36.4 C) (Oral)   Resp 18   Ht '5\' 6"'$  (1.676 m)   Wt 121 lb 9.6 oz (55.2 kg)   SpO2 98%   BMI 19.63 kg/m  General appearance: alert, cooperative, appears stated age, and no distress Head: Normocephalic, without obvious abnormality, atraumatic Eyes: conjunctivae/corneas clear. PERRL, EOM's intact. Fundi benign. Ears: normal TM's and external ear canals both ears Nose: Nares normal. Septum midline. Mucosa normal. No drainage or sinus tenderness. Throat: lips, mucosa, and tongue normal; teeth and gums normal Neck:  no adenopathy, no carotid bruit, no JVD, supple, symmetrical, trachea midline, and thyroid not enlarged, symmetric, no tenderness/mass/nodules Back: symmetric, no curvature. ROM normal. No CVA tenderness. Lungs: clear to auscultation bilaterally Heart: regular rate and rhythm, S1, S2 normal, no murmur, click, rub or gallop Abdomen: soft, non-tender; bowel sounds normal; no  masses,  no organomegaly Extremities: extremities normal, atraumatic, no cyanosis or edema Pulses: 2+ and symmetric Skin: Skin color, texture, turgor normal. No rashes or lesions Lymph nodes: Cervical, supraclavicular, and axillary nodes normal. Neurologic: Alert and oriented X 3, normal strength and tone. Normal symmetric reflexes. Normal coordination and gait    Assessment:    Healthy female exam.    Plan:    Ghm utd Check labs  See After Visit Summary for Counseling Recommendations   1. Seasonal allergic rhinitis due to pollen Stable   2. Primary hypertension Well controlled, no changes to meds. Encouraged heart healthy diet such as the DASH diet and exercise as tolerated.   - Comprehensive metabolic panel - Lipid panel  3. Preventative health care See above - CBC with Differential/Platelet - Comprehensive metabolic panel - Lipid panel - TSH  4. Vitamin D deficiency   - VITAMIN D 25 Hydroxy (Vit-D Deficiency, Fractures)

## 2022-06-29 NOTE — Patient Instructions (Addendum)
DASH Eating Plan DASH stands for Dietary Approaches to Stop Hypertension. The DASH eating plan is a healthy eating plan that has been shown to: Reduce high blood pressure (hypertension). Reduce your risk for type 2 diabetes, heart disease, and stroke. Help with weight loss. What are tips for following this plan? Reading food labels Check food labels for the amount of salt (sodium) per serving. Choose foods with less than 5 percent of the Daily Value of sodium. Generally, foods with less than 300 milligrams (mg) of sodium per serving fit into this eating plan. To find whole grains, look for the word "whole" as the first word in the ingredient list. Shopping Buy products labeled as "low-sodium" or "no salt added." Buy fresh foods. Avoid canned foods and pre-made or frozen meals. Cooking Avoid adding salt when cooking. Use salt-free seasonings or herbs instead of table salt or sea salt. Check with your health care provider or pharmacist before using salt substitutes. Do not fry foods. Cook foods using healthy methods such as baking, boiling, grilling, roasting, and broiling instead. Cook with heart-healthy oils, such as olive, canola, avocado, soybean, or sunflower oil. Meal planning  Eat a balanced diet that includes: 4 or more servings of fruits and 4 or more servings of vegetables each day. Try to fill one-half of your plate with fruits and vegetables. 6-8 servings of whole grains each day. Less than 6 oz (170 g) of lean meat, poultry, or fish each day. A 3-oz (85-g) serving of meat is about the same size as a deck of cards. One egg equals 1 oz (28 g). 2-3 servings of low-fat dairy each day. One serving is 1 cup (237 mL). 1 serving of nuts, seeds, or beans 5 times each week. 2-3 servings of heart-healthy fats. Healthy fats called omega-3 fatty acids are found in foods such as walnuts, flaxseeds, fortified milks, and eggs. These fats are also found in cold-water fish, such as sardines,  salmon, and mackerel. Limit how much you eat of: Canned or prepackaged foods. Food that is high in trans fat, such as some fried foods. Food that is high in saturated fat, such as fatty meat. Desserts and other sweets, sugary drinks, and other foods with added sugar. Full-fat dairy products. Do not salt foods before eating. Do not eat more than 4 egg yolks a week. Try to eat at least 2 vegetarian meals a week. Eat more home-cooked food and less restaurant, buffet, and fast food. Lifestyle When eating at a restaurant, ask that your food be prepared with less salt or no salt, if possible. If you drink alcohol: Limit how much you use to: 0-1 drink a day for women who are not pregnant. 0-2 drinks a day for men. Be aware of how much alcohol is in your drink. In the [place] one drink equals one 12 oz bottle of beer (355 mL), one 5 oz glass of wine (148 mL), or one 1 oz glass of hard liquor (44 mL). General information Avoid eating more than 2,300 mg of salt a day. If you have hypertension, you may need to reduce your sodium intake to 1,500 mg a day. Work with your health care provider to maintain a healthy body weight or to lose weight. Ask what an ideal weight is for you. Get at least 30 minutes of exercise that causes your heart to beat faster (aerobic exercise) most days of the week. Activities may include walking, swimming, or biking. Work with your health care provider  or dietitian to adjust your eating plan to your individual calorie needs. What foods should I eat? Fruits All fresh, dried, or frozen fruit. Canned fruit in natural juice (without added sugar). Vegetables Fresh or frozen vegetables (raw, steamed, roasted, or grilled). Low-sodium or reduced-sodium tomato and vegetable juice. Low-sodium or reduced-sodium tomato sauce and tomato paste. Low-sodium or reduced-sodium canned vegetables. Grains Whole-grain or whole-wheat bread. Whole-grain or whole-wheat pasta. Brown rice. [place]. Bulgur. Whole-grain and low-sodium cereals. Pita bread. Low-fat, low-sodium crackers. Whole-wheat flour tortillas. Meats and other proteins Skinless chicken or [place]. Ground chicken or [place]. Pork with fat trimmed off. Fish and seafood. Egg whites. Dried beans, peas, or lentils. Unsalted nuts, nut butters, and seeds. Unsalted canned beans. Lean cuts of beef with fat trimmed off. Low-sodium, lean precooked or cured meat, such as sausages or meat loaves. Dairy Low-fat (1%) or fat-free (skim) milk. Reduced-fat, low-fat, or fat-free cheeses. Nonfat, low-sodium ricotta or cottage cheese. Low-fat or nonfat yogurt. Low-fat, low-sodium cheese. Fats and oils Soft margarine without trans fats. Vegetable oil. Reduced-fat, low-fat, or light mayonnaise and salad dressings (reduced-sodium). Canola, safflower, olive, avocado, soybean, and sunflower oils. Avocado. Seasonings and condiments Herbs. Spices. Seasoning mixes without salt. Other foods Unsalted popcorn and pretzels. Fat-free sweets. The items listed above may not be a complete list of foods and beverages you can eat. Contact a dietitian for more information. What foods should I avoid? Fruits Canned fruit in a light or heavy syrup. Fried fruit. Fruit in cream or butter sauce. Vegetables Creamed or fried vegetables. Vegetables in a cheese sauce. Regular canned vegetables (not low-sodium or reduced-sodium). Regular canned tomato sauce and paste (not low-sodium or reduced-sodium). Regular tomato and vegetable juice (not low-sodium or reduced-sodium). Angie Fava. Olives. Grains Baked goods made with fat, such as croissants, muffins, or some breads. Dry pasta or rice meal packs. Meats and other proteins Fatty cuts of meat. Ribs. Fried meat. Berniece Salines. Bologna, salami, and other precooked or cured meats, such as sausages or meat loaves. Fat from the back of a pig (fatback). Bratwurst. Salted nuts and seeds. Canned beans with added salt. Canned or smoked  fish. Whole eggs or egg yolks. Chicken or [place] with skin. Dairy Whole or 2% milk, cream, and half-and-half. Whole or full-fat cream cheese. Whole-fat or sweetened yogurt. Full-fat cheese. Nondairy creamers. Whipped toppings. Processed cheese and cheese spreads. Fats and oils Butter. Stick margarine. Lard. Shortening. Ghee. Bacon fat. Tropical oils, such as coconut, palm kernel, or palm oil. Seasonings and condiments Onion salt, garlic salt, seasoned salt, table salt, and sea salt. Worcestershire sauce. Tartar sauce. Barbecue sauce. Teriyaki sauce. Soy sauce, including reduced-sodium. Steak sauce. Canned and packaged gravies. Fish sauce. Oyster sauce. Cocktail sauce. Store-bought horseradish. Ketchup. Mustard. Meat flavorings and tenderizers. Bouillon cubes. Hot sauces. Pre-made or packaged marinades. Pre-made or packaged taco seasonings. Relishes. Regular salad dressings. Other foods Salted popcorn and pretzels. The items listed above may not be a complete list of foods and beverages you should avoid. Contact a dietitian for more information. Where to find more information National Heart, Lung, and Blood Institute: https://wilson-eaton.com/ American Heart Association: www.heart.org Academy of Nutrition and Dietetics: www.eatright.[place]: www.kidney.org Summary The DASH eating plan is a healthy eating plan that has been shown to reduce high blood pressure (hypertension). It may also reduce your risk for type 2 diabetes, heart disease, and stroke. When on the DASH eating plan, aim to eat more fresh fruits and vegetables, whole grains, lean proteins, low-fat dairy, and heart-healthy fats.  With the DASH eating plan, you should limit salt (sodium) intake to 2,300 mg a day. If you have hypertension, you may need to reduce your sodium intake to 1,500 mg a day. Work with your health care provider or dietitian to adjust your eating plan to your individual calorie needs. This information  is not intended to replace advice given to you by your health care provider. Make sure you discuss any questions you have with your health care provider. Document Revised: 10/02/2019 Document Reviewed: 10/02/2019 Elsevier Patient Education  [place] 65 Years and Older, Female Preventive care refers to lifestyle choices and visits with your health care provider that can promote health and wellness. Preventive care visits are also called wellness exams. What can I expect for my preventive care visit? Counseling Your health care provider may ask you questions about your: Medical history, including: Past medical problems. Family medical history. Pregnancy and menstrual history. History of falls. Current health, including: Memory and ability to understand (cognition). Emotional well-being. Home life and relationship well-being. Sexual activity and sexual health. Lifestyle, including: Alcohol, nicotine or tobacco, and drug use. Access to firearms. Diet, exercise, and sleep habits. Work and work Statistician. Sunscreen use. Safety issues such as seatbelt and bike helmet use. Physical exam Your health care provider will check your: Height and weight. These may be used to calculate your BMI (body mass index). BMI is a measurement that tells if you are at a healthy weight. Waist circumference. This measures the distance around your waistline. This measurement also tells if you are at a healthy weight and may help predict your risk of certain diseases, such as type 2 diabetes and high blood pressure. Heart rate and blood pressure. Body temperature. Skin for abnormal spots. What immunizations do I need?  Vaccines are usually given at various ages, according to a schedule. Your health care provider will recommend vaccines for you based on your age, medical history, and lifestyle or other factors, such as travel or where you work. What tests do I need? Screening Your  health care provider may recommend screening tests for certain conditions. This may include: Lipid and cholesterol levels. Hepatitis C test. Hepatitis B test. HIV (human immunodeficiency virus) test. STI (sexually transmitted infection) testing, if you are at risk. Lung cancer screening. Colorectal cancer screening. Diabetes screening. This is done by checking your blood sugar (glucose) after you have not eaten for a while (fasting). Mammogram. Talk with your health care provider about how often you should have regular mammograms. BRCA-related cancer screening. This may be done if you have a family history of breast, ovarian, tubal, or peritoneal cancers. Bone density scan. This is done to screen for osteoporosis. Talk with your health care provider about your test results, treatment options, and if necessary, the need for more tests. Follow these instructions at home: Eating and drinking  Eat a diet that includes fresh fruits and vegetables, whole grains, lean protein, and low-fat dairy products. Limit your intake of foods with high amounts of sugar, saturated fats, and salt. Take vitamin and mineral supplements as recommended by your health care provider. Do not drink alcohol if your health care provider tells you not to drink. If you drink alcohol: Limit how much you have to 0-1 drink a day. Know how much alcohol is in your drink. In the [place] one drink equals one 12 oz bottle of beer (355 mL), one 5 oz glass of wine (148 mL), or one 1 oz glass of hard  liquor (44 mL). Lifestyle Brush your teeth every morning and night with fluoride toothpaste. Floss one time each day. Exercise for at least 30 minutes 5 or more days each week. Do not use any products that contain nicotine or tobacco. These products include cigarettes, chewing tobacco, and vaping devices, such as e-cigarettes. If you need help quitting, ask your health care provider. Do not use drugs. If you are sexually active, practice  safe sex. Use a condom or other form of protection in order to prevent STIs. Take aspirin only as told by your health care provider. Make sure that you understand how much to take and what form to take. Work with your health care provider to find out whether it is safe and beneficial for you to take aspirin daily. Ask your health care provider if you need to take a cholesterol-lowering medicine (statin). Find healthy ways to manage stress, such as: Meditation, yoga, or listening to music. Journaling. Talking to a trusted person. Spending time with friends and family. Minimize exposure to UV radiation to reduce your risk of skin cancer. Safety Always wear your seat belt while driving or riding in a vehicle. Do not drive: If you have been drinking alcohol. Do not ride with someone who has been drinking. When you are tired or distracted. While texting. If you have been using any mind-altering substances or drugs. Wear a helmet and other protective equipment during sports activities. If you have firearms in your house, make sure you follow all gun safety procedures. What's next? Visit your health care provider once a year for an annual wellness visit. Ask your health care provider how often you should have your eyes and teeth checked. Stay up to date on all vaccines. This information is not intended to replace advice given to you by your health care provider. Make sure you discuss any questions you have with your health care provider. Document Revised: 04/26/2021 Document Reviewed: 04/26/2021 Elsevier Patient Education  [place].

## 2022-07-09 ENCOUNTER — Telehealth: Payer: Self-pay | Admitting: Family Medicine

## 2022-07-09 NOTE — Telephone Encounter (Signed)
Left message for patient to call back and schedule Medicare Annual Wellness Visit (AWV).   Please offer to do virtually or by telephone.  Left office number and my jabber [phone].  Welcome to Medicare: 06/09/2020  Please schedule at anytime with Nurse Health Advisor.

## 2022-08-08 DIAGNOSIS — J019 Acute sinusitis, unspecified: Secondary | ICD-10-CM | POA: Diagnosis not present

## 2022-08-08 DIAGNOSIS — J029 Acute pharyngitis, unspecified: Secondary | ICD-10-CM | POA: Diagnosis not present

## 2022-08-30 DIAGNOSIS — H00014 Hordeolum externum left upper eyelid: Secondary | ICD-10-CM | POA: Diagnosis not present

## 2022-08-30 DIAGNOSIS — Z08 Encounter for follow-up examination after completed treatment for malignant neoplasm: Secondary | ICD-10-CM | POA: Diagnosis not present

## 2022-08-30 DIAGNOSIS — L814 Other melanin hyperpigmentation: Secondary | ICD-10-CM | POA: Diagnosis not present

## 2022-08-30 DIAGNOSIS — Z86007 Personal history of in-situ neoplasm of skin: Secondary | ICD-10-CM | POA: Diagnosis not present

## 2022-09-06 DIAGNOSIS — H00014 Hordeolum externum left upper eyelid: Secondary | ICD-10-CM | POA: Diagnosis not present

## 2022-09-17 ENCOUNTER — Other Ambulatory Visit: Payer: Self-pay | Admitting: Family Medicine

## 2022-09-17 DIAGNOSIS — J301 Allergic rhinitis due to pollen: Secondary | ICD-10-CM

## 2022-09-25 ENCOUNTER — Other Ambulatory Visit: Payer: Self-pay | Admitting: Family Medicine

## 2022-09-25 DIAGNOSIS — I10 Essential (primary) hypertension: Secondary | ICD-10-CM

## 2022-10-10 DIAGNOSIS — H524 Presbyopia: Secondary | ICD-10-CM | POA: Diagnosis not present

## 2022-11-21 ENCOUNTER — Ambulatory Visit: Payer: BLUE CROSS/BLUE SHIELD

## 2022-11-21 VITALS — Wt 121.0 lb

## 2022-11-21 DIAGNOSIS — Z0000 Encounter for general adult medical examination without abnormal findings: Secondary | ICD-10-CM

## 2022-11-21 NOTE — Progress Notes (Signed)
I connected with  Sherri Hoffman on 11/21/22 by a audio enabled telemedicine application and verified that I am speaking with the correct person using two identifiers.  Patient Location: Home  Provider Location: Home Office  I discussed the limitations of evaluation and management by telemedicine. The patient expressed understanding and agreed to proceed.   Subjective:   Sherri Hoffman is a 67 y.o. female who presents for Medicare Annual (Subsequent) preventive examination.  Review of Systems     Cardiac Risk Factors include: advanced age (>22mn, >>50women);hypertension     Objective:    Today's Vitals   11/21/22 1247  Weight: 121 lb (54.9 kg)   Body mass index is 19.53 kg/m.     11/21/2022   12:54 PM  Advanced Directives  Does Patient Have a Medical Advance Directive? Yes  Type of [place]  Copy of [place]? No - copy requested    Current Medications (verified) Outpatient Encounter Medications as of 11/21/2022  Medication Sig   azelastine (ASTELIN) 0.1 % nasal spray Place 2 sprays into both nostrils 2 (two) times daily. Use in each nostril as directed   Calcium Carbonate-Vitamin D 500-125 MG-UNIT TABS Take by mouth.   cholecalciferol (VITAMIN D) 1000 units tablet Take 2,000 Units by mouth daily.    estradiol (ESTRACE) 1 MG tablet Take 1 tablet (1 mg total) by mouth daily.   fluticasone (FLONASE) 50 MCG/ACT nasal spray SHAKE LIQUID AND USE 2 SPRAYS IN EACH NOSTRIL DAILY   levocetirizine (XYZAL) 5 MG tablet Take 1 tablet (5 mg total) by mouth daily as needed for allergies.   potassium chloride (KLOR-CON M) 10 MEQ tablet TAKE 1 TABLET(10 MEQ) BY MOUTH DAILY   progesterone (PROMETRIUM) 100 MG capsule Take 1 capsule (100 mg total) by mouth daily.   valsartan-hydrochlorothiazide (DIOVAN-HCT) 320-25 MG tablet TAKE 1 TABLET BY MOUTH DAILY   albuterol (VENTOLIN HFA) 108 (90 Base) MCG/ACT inhaler  INHALE 2 PUFFS INTO THE LUNGS EVERY 6 HOURS AS NEEDED FOR WHEEZING OR SHORTNESS OF BREATH (Patient not taking: Reported on 11/21/2022)   [DISCONTINUED] triamcinolone (NASACORT AQ) 55 MCG/ACT nasal inhaler Place 2 sprays into the nose daily.   No facility-administered encounter medications on file as of 11/21/2022.    Allergies (verified) Ace inhibitors, Erythromycin, Hydrocodone bit-homatrop mbr, and Tussionex pennkinetic er [hydrocod poli-chlorphe poli er]   History: Past Medical History:  Diagnosis Date   Hypertension    Skin cancer, basal cell    Past Surgical History:  Procedure Laterality Date   KNEE ARTHROSCOPY Left 03/06/2022   [place]   MYOMECTOMY     Family History  Problem Relation Age of Onset   Heart disease Mother    Diabetes Mother    Hyperlipidemia Mother    COPD Mother    Cancer Father        melanoma   Heart disease Father        chf   COPD Father    Skin cancer Other        Melanoma   Coronary artery disease Other    COPD Other    Cancer Other        skin, melanoma   Depression Brother    Social History   Socioeconomic History   Marital status: Married    Spouse name: Not on file   Number of children: Not on file   Years of education: Not on file   Highest education level:  Not on file  Occupational History   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  Tobacco Use   Smoking status: Never   Smokeless tobacco: Never  Substance and Sexual Activity   Alcohol use: Yes    Alcohol/week: 3.0 standard drinks of alcohol    Types: 3 Glasses of wine per week   Drug use: No   Sexual activity: Yes    Partners: Male    Birth control/protection: Post-menopausal  Other Topics Concern   Not on file  Social History Narrative   Not on file   Social Determinants of Health   Financial Resource Strain: Low Risk  (11/21/2022)   Overall Financial Resource Strain (CARDIA)    Difficulty of Paying Living Expenses: Not hard at all  Food Insecurity: No Food  Insecurity (11/21/2022)   Hunger Vital Sign    Worried About Running Out of Food in the Last Year: Never true    Ran Out of Food in the Last Year: Never true  Transportation Needs: No Transportation Needs (11/21/2022)   PRAPARE - Hydrologist (Medical): No    Lack of Transportation (Non-Medical): No  Physical Activity: Sufficiently Active (11/21/2022)   Exercise Vital Sign    Days of Exercise per Week: 5 days    Minutes of Exercise per Session: 90 min  Stress: No Stress Concern Present (11/21/2022)   [place]    Feeling of Stress : Not at all  Social Connections: Moderately Integrated (11/21/2022)   Social Connection and Isolation Panel [NHANES]    Frequency of Communication with Friends and Family: More than three times a week    Frequency of Social Gatherings with Friends and Family: More than three times a week    Attends Religious Services: 1 to 4 times per year    Active Member of [employer] or Organizations: No    Attends Music therapist: Never    Marital Status: Married    Tobacco Counseling Counseling given: Not Answered   Clinical Intake:  Pre-visit preparation completed: Yes  Pain : No/denies pain     BMI - recorded: 19.53 Nutritional Status: BMI of 19-24  Normal Diabetes: No  How often do you need to have someone help you when you read instructions, pamphlets, or other written materials from your doctor or pharmacy?: 1 - Never  Diabetic?no  Interpreter Needed?: No  Information entered by :: Charlott Rakes, LPN   Activities of Daily Living    11/21/2022   12:55 PM  In your present state of health, do you have any difficulty performing the following activities:  Hearing? 0  Vision? 0  Difficulty concentrating or making decisions? 0  Walking or climbing stairs? 0  Dressing or bathing? 0  Doing errands, shopping? 0  Preparing Food and eating ? N   Using the Toilet? N  In the past six months, have you accidently leaked urine? N  Do you have problems with loss of bowel control? N  Managing your Medications? N  Managing your Finances? N  Housekeeping or managing your Housekeeping? N    Patient Care Team: Carollee Herter, Alferd Apa, DO as PCP - General Delsa Bern, MD as Consulting Physician (Obstetrics and Gynecology) Center, Skin Surgery  Indicate any recent Medical Services you may have received from other than [facility] providers in the past year (date may be approximate).     Assessment:   This is a routine wellness examination  for [employer].  Hearing/Vision screen Hearing Screening - Comments:: Pt denies any hearing issues  Vision Screening - Comments:: Pt follows up with my eye dr at [facility] [facility] for annual eye exams   Dietary issues and exercise activities discussed: Current Exercise Habits: Home exercise routine, Type of exercise: Other - see comments;yoga, Time (Minutes): > 60, Frequency (Times/Week): 5, Weekly Exercise (Minutes/Week): 0   Goals Addressed             This Visit's Progress    Patient Stated       Keep on staying active        Depression Screen    11/21/2022   12:53 PM 09/19/2021    5:46 PM 06/13/2021    9:09 AM 06/09/2020   11:13 AM 06/09/2020    8:06 AM 11/26/2017   10:43 AM 11/21/2012    8:33 AM  PHQ 2/9 Scores  PHQ - 2 Score 0 0 0 0 0 0 0  PHQ- 9 Score    0       Fall Risk    11/21/2022   12:55 PM 09/19/2021    5:46 PM 06/13/2021    9:09 AM 06/09/2020    8:03 AM 11/26/2017   10:43 AM  Fall Risk   Falls in the past year? 0 0 0 0 No  Number falls in past yr: 0 0 0    Injury with Fall? 0 0 0    Risk for fall due to : Impaired vision No Fall Risks     Follow up Falls prevention discussed Falls evaluation completed Falls evaluation completed      FALL RISK PREVENTION PERTAINING TO THE HOME:  Any stairs in or around the home? Yes  If so, are there any without handrails? No  Home free of loose  throw rugs in walkways, pet beds, electrical cords, etc? Yes  Adequate lighting in your home to reduce risk of falls? Yes   ASSISTIVE DEVICES UTILIZED TO PREVENT FALLS:  Life alert? Yes  Use of a cane, walker or w/c? No  Grab bars in the bathroom? No  Shower chair or bench in shower? Yes  Elevated toilet seat or a handicapped toilet? No   TIMED UP AND GO:  Was the test performed? No .  Cognitive Function:    06/09/2020   11:13 AM  MMSE - Mini Mental State Exam  Orientation to time 5  Orientation to Place 5  Registration 3  Attention/ Calculation 5  Recall 3  Language- name 2 objects 2  Language- repeat 1  Language- follow 3 step command 3  Language- read & follow direction 1  Write a sentence 1  Copy design 1  Total score 30        11/21/2022   12:56 PM  6CIT Screen  What Year? 0 points  What month? 0 points  What time? 0 points  Count back from 20 0 points  Months in reverse 0 points  Repeat phrase 0 points  Total Score 0 points    Immunizations Immunization History  Administered Date(s) Administered   Influenza Whole 08/09/2010, 08/30/2011, 08/26/2012   Influenza,inj,Quad PF,6+ Mos 08/12/2019   Influenza-Unspecified 08/12/2013, 08/12/2020   Moderna Sars-Covid-2 Vaccination 12/17/2019, 01/14/2020   PNEUMOCOCCAL CONJUGATE-20 06/13/2021   Td 03/18/2007   Zoster, Live 04/18/2015    TDAP status: Due, Education has been provided regarding the importance of this vaccine. Advised may receive this vaccine at local pharmacy or Health Dept. Aware to provide a copy  of the vaccination record if obtained from local pharmacy or Health Dept. Verbalized acceptance and understanding.  Flu Vaccine status: Due, Education has been provided regarding the importance of this vaccine. Advised may receive this vaccine at local pharmacy or Health Dept. Aware to provide a copy of the vaccination record if obtained from local pharmacy or Health Dept. Verbalized acceptance and  understanding.  Pneumococcal vaccine status: Up to date  Covid-19 vaccine status: Completed vaccines  Qualifies for Shingles Vaccine? Yes   Zostavax completed No   Shingrix Completed?: No.    Education has been provided regarding the importance of this vaccine. Patient has been advised to call insurance company to determine out of pocket expense if they have not yet received this vaccine. Advised may also receive vaccine at local pharmacy or Health Dept. Verbalized acceptance and understanding.  Screening Tests Health Maintenance  Topic Date Due   Zoster Vaccines- Shingrix (1 of 2) Never done   DTaP/Tdap/Td (2 - Tdap) 03/17/2017   DEXA SCAN  Never done   COVID-19 Vaccine (3 - Moderna risk series) 02/11/2020   INFLUENZA VACCINE  06/12/2022   MAMMOGRAM  03/29/2023   COLONOSCOPY (Pts 45-67yr Insurance coverage will need to be confirmed)  08/11/2028   Pneumonia Vaccine 67 Years old  Completed   Hepatitis C Screening  Completed   HPV VACCINES  Aged Out    Health Maintenance  Health Maintenance Due  Topic Date Due   Zoster Vaccines- Shingrix (1 of 2) Never done   DTaP/Tdap/Td (2 - Tdap) 03/17/2017   DEXA SCAN  Never done   COVID-19 Vaccine (3 - Moderna risk series) 02/11/2020   INFLUENZA VACCINE  06/12/2022    Colorectal cancer screening: Type of screening: Colonoscopy. Completed 08/11/18. Repeat every 10 years  Mammogram status: Completed 03/28/21. Repeat every year  Pt stated completed    Additional Screening:  Hepatitis C Screening:  Completed 07/19/16  Vision Screening: Recommended annual ophthalmology exams for early detection of glaucoma and other disorders of the eye. Is the patient up to date with their annual eye exam?  Yes  Who is the provider or what is the name of the office in which the patient attends annual eye exams? [facility]  If pt is not established with a provider, would they like to be referred to a provider to establish care? No .   Dental  Screening: Recommended annual dental exams for proper oral hygiene  Community Resource Referral / Chronic Care Management: CRR required this visit?  No   CCM required this visit?  No      Plan:     I have personally reviewed and noted the following in the patient's chart:   Medical and social history Use of alcohol, tobacco or illicit drugs  Current medications and supplements including opioid prescriptions. Patient is not currently taking opioid prescriptions. Functional ability and status Nutritional status Physical activity Advanced directives List of other physicians Hospitalizations, surgeries, and ER visits in previous 12 months Vitals Screenings to include cognitive, depression, and falls Referrals and appointments  In addition, I have reviewed and discussed with patient certain preventive protocols, quality metrics, and best practice recommendations. A written personalized care plan for preventive services as well as general preventive health recommendations were provided to patient.     TWillette Brace LPN   [id number]/[id number]  Nurse Notes: none

## 2022-11-21 NOTE — Patient Instructions (Signed)
Sherri Hoffman , Thank you for taking time to come for your Medicare Wellness Visit. I appreciate your ongoing commitment to your health goals. Please review the following plan we discussed and let me know if I can assist you in the future.   These are the goals we discussed:  Goals      Patient Stated     Keep on staying active         This is a list of the screening recommended for you and due dates:  Health Maintenance  Topic Date Due   Zoster (Shingles) Vaccine (1 of 2) Never done   DTaP/Tdap/Td vaccine (2 - Tdap) 03/17/2017   DEXA scan (bone density measurement)  Never done   COVID-19 Vaccine (3 - Moderna risk series) 02/11/2020   Flu Shot  06/12/2022   Mammogram  03/29/2023   Colon Cancer Screening  08/11/2028   Pneumonia Vaccine  Completed   Hepatitis C Screening: USPSTF Recommendation to screen - Ages 18-79 yo.  Completed   HPV Vaccine  Aged Out    Advanced directives: Please bring a copy of your health care power of attorney and living will to the office at your convenience.  Conditions/risks identified: stay active   Next appointment: Follow up in one year for your annual wellness visit    Preventive Care 65 Years and Older, Female Preventive care refers to lifestyle choices and visits with your health care provider that can promote health and wellness. What does preventive care include? A yearly physical exam. This is also called an annual well check. Dental exams once or twice a year. Routine eye exams. Ask your health care provider how often you should have your eyes checked. Personal lifestyle choices, including: Daily care of your teeth and gums. Regular physical activity. Eating a healthy diet. Avoiding tobacco and drug use. Limiting alcohol use. Practicing safe sex. Taking low-dose aspirin every day. Taking vitamin and mineral supplements as recommended by your health care provider. What happens during an annual well check? The services and screenings  done by your health care provider during your annual well check will depend on your age, overall health, lifestyle risk factors, and family history of disease. Counseling  Your health care provider may ask you questions about your: Alcohol use. Tobacco use. Drug use. Emotional well-being. Home and relationship well-being. Sexual activity. Eating habits. History of falls. Memory and ability to understand (cognition). Work and work Statistician. Reproductive health. Screening  You may have the following tests or measurements: Height, weight, and BMI. Blood pressure. Lipid and cholesterol levels. These may be checked every 5 years, or more frequently if you are over 42 years old. Skin check. Lung cancer screening. You may have this screening every year starting at age 10 if you have a 30-pack-year history of smoking and currently smoke or have quit within the past 15 years. Fecal occult blood test (FOBT) of the stool. You may have this test every year starting at age 73. Flexible sigmoidoscopy or colonoscopy. You may have a sigmoidoscopy every 5 years or a colonoscopy every 10 years starting at age 40. Hepatitis C blood test. Hepatitis B blood test. Sexually transmitted disease (STD) testing. Diabetes screening. This is done by checking your blood sugar (glucose) after you have not eaten for a while (fasting). You may have this done every 1-3 years. Bone density scan. This is done to screen for osteoporosis. You may have this done starting at age 22. Mammogram. This may be done every 1-2  years. Talk to your health care provider about how often you should have regular mammograms. Talk with your health care provider about your test results, treatment options, and if necessary, the need for more tests. Vaccines  Your health care provider may recommend certain vaccines, such as: Influenza vaccine. This is recommended every year. Tetanus, diphtheria, and acellular pertussis (Tdap, Td)  vaccine. You may need a Td booster every 10 years. Zoster vaccine. You may need this after age 37. Pneumococcal 13-valent conjugate (PCV13) vaccine. One dose is recommended after age 18. Pneumococcal polysaccharide (PPSV23) vaccine. One dose is recommended after age 59. Talk to your health care provider about which screenings and vaccines you need and how often you need them. This information is not intended to replace advice given to you by your health care provider. Make sure you discuss any questions you have with your health care provider. Document Released: 11/25/2015 Document Revised: 07/18/2016 Document Reviewed: 08/30/2015 Elsevier Interactive Patient Education  2017 [place] Prevention in the Home Falls can cause injuries. They can happen to people of all ages. There are many things you can do to make your home safe and to help prevent falls. What can I do on the outside of my home? Regularly fix the edges of walkways and driveways and fix any cracks. Remove anything that might make you trip as you walk through a door, such as a raised step or threshold. Trim any bushes or trees on the path to your home. Use bright outdoor lighting. Clear any walking paths of anything that might make someone trip, such as rocks or tools. Regularly check to see if handrails are loose or broken. Make sure that both sides of any steps have handrails. Any raised decks and porches should have guardrails on the edges. Have any leaves, snow, or ice cleared regularly. Use sand or salt on walking paths during winter. Clean up any spills in your garage right away. This includes oil or grease spills. What can I do in the bathroom? Use night lights. Install grab bars by the toilet and in the tub and shower. Do not use towel bars as grab bars. Use non-skid mats or decals in the tub or shower. If you need to sit down in the shower, use a plastic, non-slip stool. Keep the floor dry. Clean up any  water that spills on the floor as soon as it happens. Remove soap buildup in the tub or shower regularly. Attach bath mats securely with double-sided non-slip rug tape. Do not have throw rugs and other things on the floor that can make you trip. What can I do in the bedroom? Use night lights. Make sure that you have a light by your bed that is easy to reach. Do not use any sheets or blankets that are too big for your bed. They should not hang down onto the floor. Have a firm chair that has side arms. You can use this for support while you get dressed. Do not have throw rugs and other things on the floor that can make you trip. What can I do in the kitchen? Clean up any spills right away. Avoid walking on wet floors. Keep items that you use a lot in easy-to-reach places. If you need to reach something above you, use a strong step stool that has a grab bar. Keep electrical cords out of the way. Do not use floor polish or wax that makes floors slippery. If you must use wax, use non-skid floor  wax. Do not have throw rugs and other things on the floor that can make you trip. What can I do with my stairs? Do not leave any items on the stairs. Make sure that there are handrails on both sides of the stairs and use them. Fix handrails that are broken or loose. Make sure that handrails are as long as the stairways. Check any carpeting to make sure that it is firmly attached to the stairs. Fix any carpet that is loose or worn. Avoid having throw rugs at the top or bottom of the stairs. If you do have throw rugs, attach them to the floor with carpet tape. Make sure that you have a light switch at the top of the stairs and the bottom of the stairs. If you do not have them, ask someone to add them for you. What else can I do to help prevent falls? Wear shoes that: Do not have high heels. Have rubber bottoms. Are comfortable and fit you well. Are closed at the toe. Do not wear sandals. If you use a  stepladder: Make sure that it is fully opened. Do not climb a closed stepladder. Make sure that both sides of the stepladder are locked into place. Ask someone to hold it for you, if possible. Clearly mark and make sure that you can see: Any grab bars or handrails. First and last steps. Where the edge of each step is. Use tools that help you move around (mobility aids) if they are needed. These include: Canes. Walkers. Scooters. Crutches. Turn on the lights when you go into a dark area. Replace any light bulbs as soon as they burn out. Set up your furniture so you have a clear path. Avoid moving your furniture around. If any of your floors are uneven, fix them. If there are any pets around you, be aware of where they are. Review your medicines with your doctor. Some medicines can make you feel dizzy. This can increase your chance of falling. Ask your doctor what other things that you can do to help prevent falls. This information is not intended to replace advice given to you by your health care provider. Make sure you discuss any questions you have with your health care provider. Document Released: 08/25/2009 Document Revised: 04/05/2016 Document Reviewed: 12/03/2014 Elsevier Interactive Patient Education  2017 [employer].

## 2022-11-28 DIAGNOSIS — J019 Acute sinusitis, unspecified: Secondary | ICD-10-CM | POA: Diagnosis not present

## 2023-01-01 ENCOUNTER — Ambulatory Visit (INDEPENDENT_AMBULATORY_CARE_PROVIDER_SITE_OTHER): Payer: Medicare Other | Admitting: Family Medicine

## 2023-01-01 ENCOUNTER — Encounter: Payer: Self-pay | Admitting: Family Medicine

## 2023-01-01 VITALS — BP 132/80 | HR 71 | Temp 97.7°F | Resp 18 | Ht 66.0 in | Wt 124.2 lb

## 2023-01-01 DIAGNOSIS — E559 Vitamin D deficiency, unspecified: Secondary | ICD-10-CM

## 2023-01-01 DIAGNOSIS — I10 Essential (primary) hypertension: Secondary | ICD-10-CM

## 2023-01-01 DIAGNOSIS — J301 Allergic rhinitis due to pollen: Secondary | ICD-10-CM

## 2023-01-01 DIAGNOSIS — R053 Chronic cough: Secondary | ICD-10-CM | POA: Diagnosis not present

## 2023-01-01 LAB — COMPREHENSIVE METABOLIC PANEL
ALT: 11 U/L (ref 0–35)
AST: 16 U/L (ref 0–37)
Albumin: 4.3 g/dL (ref 3.5–5.2)
Alkaline Phosphatase: 36 U/L — ABNORMAL LOW (ref 39–117)
BUN: 21 mg/dL (ref 6–23)
CO2: 28 mEq/L (ref 19–32)
Calcium: 9.8 mg/dL (ref 8.4–10.5)
Chloride: 101 mEq/L (ref 96–112)
Creatinine, Ser: 0.55 mg/dL (ref 0.40–1.20)
GFR: 94.49 mL/min (ref >60.00)
Glucose, Bld: 92 mg/dL (ref 70–99)
Potassium: 4.4 mEq/L (ref 3.5–5.1)
Sodium: 138 mEq/L (ref 135–145)
Total Bilirubin: 0.5 mg/dL (ref 0.2–1.2)
Total Protein: 7.1 g/dL (ref 6.0–8.3)

## 2023-01-01 LAB — LIPID PANEL
Cholesterol: 179 mg/dL (ref 0–200)
HDL: 74.9 mg/dL (ref >39.00)
LDL Cholesterol: 70 mg/dL (ref 0–99)
NonHDL: 104.57
Total CHOL/HDL Ratio: 2
Triglycerides: 171 mg/dL — ABNORMAL HIGH (ref 0.0–149.0)
VLDL: 34.2 mg/dL (ref 0.0–40.0)

## 2023-01-01 LAB — CBC WITH DIFFERENTIAL/PLATELET
Basophils Absolute: 0 10*3/uL (ref 0.0–0.1)
Basophils Relative: 0.6 % (ref 0.0–3.0)
Eosinophils Absolute: 0.1 10*3/uL (ref 0.0–0.7)
Eosinophils Relative: 2.6 % (ref 0.0–5.0)
HCT: 41.2 % (ref 36.0–46.0)
Hemoglobin: 13.6 g/dL (ref 12.0–15.0)
Lymphocytes Relative: 38 % (ref 12.0–46.0)
Lymphs Abs: 1.8 10*3/uL (ref 0.7–4.0)
MCHC: 32.9 g/dL (ref 30.0–36.0)
MCV: 93.7 fl (ref 78.0–100.0)
Monocytes Absolute: 0.5 10*3/uL (ref 0.1–1.0)
Monocytes Relative: 10.1 % (ref 3.0–12.0)
Neutro Abs: 2.3 10*3/uL (ref 1.4–7.7)
Neutrophils Relative %: 48.7 % (ref 43.0–77.0)
Platelets: 260 10*3/uL (ref 150.0–400.0)
RBC: 4.4 Mil/uL (ref 3.87–5.11)
RDW: 13.4 % (ref 11.5–15.5)
WBC: 4.7 10*3/uL (ref 4.0–10.5)

## 2023-01-01 LAB — VITAMIN D 25 HYDROXY (VIT D DEFICIENCY, FRACTURES): VITD: 38.32 ng/mL (ref 30.00–100.00)

## 2023-01-01 LAB — TSH: TSH: 2.82 u[IU]/mL (ref 0.35–5.50)

## 2023-01-01 MED ORDER — ALBUTEROL SULFATE HFA 108 (90 BASE) MCG/ACT IN AERS: 2.0000 | INHALATION_SPRAY | Freq: Four times a day (QID) | RESPIRATORY_TRACT | 2 refills | Status: AC | PRN

## 2023-01-01 NOTE — Progress Notes (Signed)
Subjective:   By signing my name below, I, [name], attest that this documentation has been prepared under the direction and in the presence of Ann Held, DO. 01/01/2023   Patient ID: Sherri Hoffman, female    DOB: April 26, 1956, 67 y.o.   MRN: [id number]  Chief Complaint  Patient presents with  . Hypertension  . Follow-up    Hypertension Associated symptoms include PND. Pertinent negatives include no blurred vision, chest pain, headaches, malaise/fatigue, palpitations or shortness of breath.   Patient is in today for a follow up visit.   Her blood pressure is stable during this visit. She measures her blood pressure at home and reports they typically measure within normal range. She continues taking 320-25 mg Valsartan-Hydrochlorothiazide daily PO, 10 meq potassium chloride daily PO and reports no new issues while taking them.  BP Readings from Last 3 Encounters:  01/01/23 132/80  06/29/22 (!) 142/70  12/14/21 114/80   Pulse Readings from Last 3 Encounters:  01/01/23 71  06/29/22 67  12/14/21 79   She complains of congestion and feeling like her ears are "under water" for the past week. She is taking Xyzal and Astelin nazal spray every night to manage her symptoms.  She is due for a tetanus vaccine and is planning on receiving it at her pharmacy.    Past Medical History:  Diagnosis Date  . Hypertension   . Skin cancer, basal cell     Past Surgical History:  Procedure Laterality Date  . KNEE ARTHROSCOPY Left 03/06/2022   [place]  . MYOMECTOMY      Family History  Problem Relation Age of Onset  . Heart disease Mother   . Diabetes Mother   . Hyperlipidemia Mother   . COPD Mother   . Cancer Father        melanoma  . Heart disease Father        chf  . COPD Father   . Skin cancer Other        Melanoma  . Coronary artery disease Other   . COPD Other   . Cancer Other        skin, melanoma  . Depression Brother     Social History    Socioeconomic History  . Marital status: Married    Spouse name: Not on file  . Number of children: Not on file  . Years of education: Not on file  . Highest education level: Not on file  Occupational History  . 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  Tobacco Use  . Smoking status: Never  . Smokeless tobacco: Never  Substance and Sexual Activity  . Alcohol use: Yes    Alcohol/week: 3.0 standard drinks of alcohol    Types: 3 Glasses of wine per week  . Drug use: No  . Sexual activity: Yes    Partners: Male    Birth control/protection: Post-menopausal  Other Topics Concern  . Not on file  Social History Narrative  . Not on file   Social Determinants of Health   Financial Resource Strain: Low Risk  (11/21/2022)   Overall Financial Resource Strain (CARDIA)   . Difficulty of Paying Living Expenses: Not hard at all  Food Insecurity: No Food Insecurity (11/21/2022)   Hunger Vital Sign   . Worried About Charity fundraiser in the Last Year: Never true   . Ran Out of Food in the Last Year: Never true  Transportation Needs: No Transportation Needs (11/21/2022)  PRAPARE - Transportation   . Lack of Transportation (Medical): No   . Lack of Transportation (Non-Medical): No  Physical Activity: Sufficiently Active (11/21/2022)   Exercise Vital Sign   . Days of Exercise per Week: 5 days   . Minutes of Exercise per Session: 90 min  Stress: No Stress Concern Present (11/21/2022)   [place]   . Feeling of Stress : Not at all  Social Connections: Moderately Integrated (11/21/2022)   Social Connection and Isolation Panel [NHANES]   . Frequency of Communication with Friends and Family: More than three times a week   . Frequency of Social Gatherings with Friends and Family: More than three times a week   . Attends Religious Services: 1 to 4 times per year   . Active Member of Clubs or Organizations: No   . Attends  Archivist Meetings: Never   . Marital Status: Married  Human resources officer Violence: Not At Risk (11/21/2022)   Humiliation, Afraid, Rape, and Kick questionnaire   . Fear of Current or Ex-Partner: No   . Emotionally Abused: No   . Physically Abused: No   . Sexually Abused: No    Outpatient Medications Prior to Visit  Medication Sig Dispense Refill  . azelastine (ASTELIN) 0.1 % nasal spray Place 2 sprays into both nostrils 2 (two) times daily. Use in each nostril as directed 30 mL 12  . Calcium Carbonate-Vitamin D 500-125 MG-UNIT TABS Take by mouth.    . cholecalciferol (VITAMIN D) 1000 units tablet Take 2,000 Units by mouth daily.     Marland Kitchen estradiol (ESTRACE) 1 MG tablet Take 1 tablet (1 mg total) by mouth daily. 90 tablet 3  . fluticasone (FLONASE) 50 MCG/ACT nasal spray SHAKE LIQUID AND USE 2 SPRAYS IN EACH NOSTRIL DAILY 16 g 6  . levocetirizine (XYZAL) 5 MG tablet Take 1 tablet (5 mg total) by mouth daily as needed for allergies. 90 tablet 1  . potassium chloride (KLOR-CON M) 10 MEQ tablet TAKE 1 TABLET(10 MEQ) BY MOUTH DAILY 90 tablet 1  . progesterone (PROMETRIUM) 100 MG capsule Take 1 capsule (100 mg total) by mouth daily. 90 capsule 4  . valsartan-hydrochlorothiazide (DIOVAN-HCT) 320-25 MG tablet TAKE 1 TABLET BY MOUTH DAILY 90 tablet 1  . albuterol (VENTOLIN HFA) 108 (90 Base) MCG/ACT inhaler INHALE 2 PUFFS INTO THE LUNGS EVERY 6 HOURS AS NEEDED FOR WHEEZING OR SHORTNESS OF BREATH (Patient not taking: Reported on 11/21/2022) 20.1 g 2   No facility-administered medications prior to visit.    Allergies  Allergen Reactions  . Ace Inhibitors     REACTION: COUGH  . Erythromycin     REACTION: NAUSEA  . Hydrocodone Bit-Homatrop Mbr     Nausea/vomitting  . Tussionex Pennkinetic Er [Hydrocod Poli-Chlorphe Poli Er]     nausea    Review of Systems  Constitutional:  Negative for fever and malaise/fatigue.  HENT:  Positive for congestion.        (+)ear fullness  Eyes:   Negative for blurred vision.  Respiratory:  Negative for cough and shortness of breath.   Cardiovascular:  Positive for PND. Negative for chest pain, palpitations and leg swelling.  Gastrointestinal:  Negative for vomiting.  Musculoskeletal:  Negative for back pain.  Skin:  Negative for rash.  Neurological:  Negative for loss of consciousness and headaches.       Objective:    Physical Exam Vitals and nursing note reviewed.  Constitutional:  General: She is not in acute distress.    Appearance: Normal appearance. She is not ill-appearing.  HENT:     Head: Normocephalic and atraumatic.     Right Ear: External ear normal.     Left Ear: External ear normal.     Mouth/Throat:     Pharynx: No oropharyngeal exudate or posterior oropharyngeal erythema.  Eyes:     Extraocular Movements: Extraocular movements intact.     Pupils: Pupils are equal, round, and reactive to light.  Cardiovascular:     Rate and Rhythm: Normal rate and regular rhythm.     Heart sounds: Normal heart sounds. No murmur heard.    No gallop.  Pulmonary:     Effort: Pulmonary effort is normal. No respiratory distress.     Breath sounds: Normal breath sounds. No wheezing or rales.  Musculoskeletal:     Cervical back: Normal range of motion and neck supple.  Lymphadenopathy:     Cervical: No cervical adenopathy.  Skin:    General: Skin is warm and dry.  Neurological:     Mental Status: She is alert and oriented to person, place, and time.  Psychiatric:        Judgment: Judgment normal.    BP 132/80 (BP Location: Right Arm, Patient Position: Sitting, Cuff Size: Normal)   Pulse 71   Temp 97.7 F (36.5 C) (Oral)   Resp 18   Ht 5' 6"$  (1.676 m)   Wt 124 lb 3.2 oz (56.3 kg)   SpO2 98%   BMI 20.05 kg/m  Wt Readings from Last 3 Encounters:  01/01/23 124 lb 3.2 oz (56.3 kg)  11/21/22 121 lb (54.9 kg)  06/29/22 121 lb 9.6 oz (55.2 kg)       Assessment & Plan:  Primary hypertension -     CBC with  Differential/Platelet -     Comprehensive metabolic panel -     Lipid panel -     TSH -     CT CARDIAC SCORING (SELF PAY ONLY); Future  Seasonal allergic rhinitis due to pollen Assessment & Plan: Con't flonase , astelin and xyazl Return to office as needed    Vitamin D deficiency Assessment & Plan: Check labs   Orders: -     VITAMIN D 25 Hydroxy (Vit-D Deficiency, Fractures)  Chronic cough -     Albuterol Sulfate HFA; Inhale 2 puffs into the lungs every 6 (six) hours as needed for wheezing or shortness of breath.  Dispense: 20.1 g; Refill: 2  Essential hypertension Assessment & Plan: Well controlled, no changes to meds. Encouraged heart healthy diet such as the DASH diet and exercise as tolerated.       IAnn Held, DO, personally preformed the services described in this documentation.  All medical record entries made by the scribe were at my direction and in my presence.  I have reviewed the chart and discharge instructions (if applicable) and agree that the record reflects my personal performance and is accurate and complete. 01/01/2023   I,[name],acting as a scribe for Ann Held, DO.,have documented all relevant documentation on the behalf of Ann Held, DO,as directed by  Ann Held, DO while in the presence of Ann Held, DO.   Ann Held, DO

## 2023-01-01 NOTE — Assessment & Plan Note (Signed)
Well controlled, no changes to meds. Encouraged heart healthy diet such as the DASH diet and exercise as tolerated.  

## 2023-01-01 NOTE — Assessment & Plan Note (Signed)
Check labs 

## 2023-01-01 NOTE — Assessment & Plan Note (Signed)
Con't flonase , astelin and xyazl Return to office as needed

## 2023-01-01 NOTE — Patient Instructions (Signed)

## 2023-01-15 ENCOUNTER — Encounter: Payer: Self-pay | Admitting: Family Medicine

## 2023-01-16 ENCOUNTER — Other Ambulatory Visit: Payer: Self-pay | Admitting: Family Medicine

## 2023-01-16 DIAGNOSIS — J0140 Acute pansinusitis, unspecified: Secondary | ICD-10-CM

## 2023-01-16 MED ORDER — DOXYCYCLINE HYCLATE 100 MG PO TABS: 100.0000 mg | ORAL_TABLET | Freq: Two times a day (BID) | ORAL | 0 refills | Status: DC

## 2023-01-23 ENCOUNTER — Ambulatory Visit (HOSPITAL_BASED_OUTPATIENT_CLINIC_OR_DEPARTMENT_OTHER)
Admission: RE | Admit: 2023-01-23 | Discharge: 2023-01-23 | Disposition: A | Discharge status: Home / Self Care | Payer: Medicare Other | Source: Ambulatory Visit | Attending: Family Medicine | Admitting: Family Medicine

## 2023-01-23 DIAGNOSIS — I10 Essential (primary) hypertension: Secondary | ICD-10-CM | POA: Insufficient documentation

## 2023-01-25 ENCOUNTER — Other Ambulatory Visit: Payer: Self-pay

## 2023-01-25 MED ORDER — ROSUVASTATIN CALCIUM 10 MG PO TABS: 10.0000 mg | ORAL_TABLET | Freq: Every day | ORAL | 2 refills | Status: DC

## 2023-02-27 ENCOUNTER — Encounter: Payer: Self-pay | Admitting: Family Medicine

## 2023-02-28 MED ORDER — ATORVASTATIN CALCIUM 10 MG PO TABS: 10.0000 mg | ORAL_TABLET | Freq: Every evening | ORAL | 2 refills | Status: DC

## 2023-03-20 ENCOUNTER — Other Ambulatory Visit: Payer: Self-pay | Admitting: Family Medicine

## 2023-03-20 DIAGNOSIS — I10 Essential (primary) hypertension: Secondary | ICD-10-CM

## 2023-04-01 ENCOUNTER — Encounter: Payer: Self-pay | Admitting: Family Medicine

## 2023-04-01 DIAGNOSIS — J301 Allergic rhinitis due to pollen: Secondary | ICD-10-CM

## 2023-04-01 MED ORDER — LEVOCETIRIZINE DIHYDROCHLORIDE 5 MG PO TABS
5.0000 mg | ORAL_TABLET | Freq: Every day | ORAL | 1 refills | Status: DC | PRN
Start: 2023-04-01 — End: 2023-09-18

## 2023-04-11 DIAGNOSIS — Z1231 Encounter for screening mammogram for malignant neoplasm of breast: Secondary | ICD-10-CM | POA: Diagnosis not present

## 2023-04-11 LAB — HM MAMMOGRAPHY

## 2023-04-16 ENCOUNTER — Encounter: Payer: Self-pay | Admitting: Family Medicine

## 2023-04-16 DIAGNOSIS — Z01419 Encounter for gynecological examination (general) (routine) without abnormal findings: Secondary | ICD-10-CM | POA: Diagnosis not present

## 2023-04-16 DIAGNOSIS — Z79890 Hormone replacement therapy (postmenopausal): Secondary | ICD-10-CM | POA: Diagnosis not present

## 2023-05-24 ENCOUNTER — Other Ambulatory Visit: Payer: Self-pay | Admitting: Family Medicine

## 2023-06-02 IMAGING — DX DG CHEST 2V
2 series · 2 of 2 positions shown · non-contrast
Comparison: None.

CLINICAL DATA: cough

EXAM:
CHEST - 2 VIEW

[chest pa]
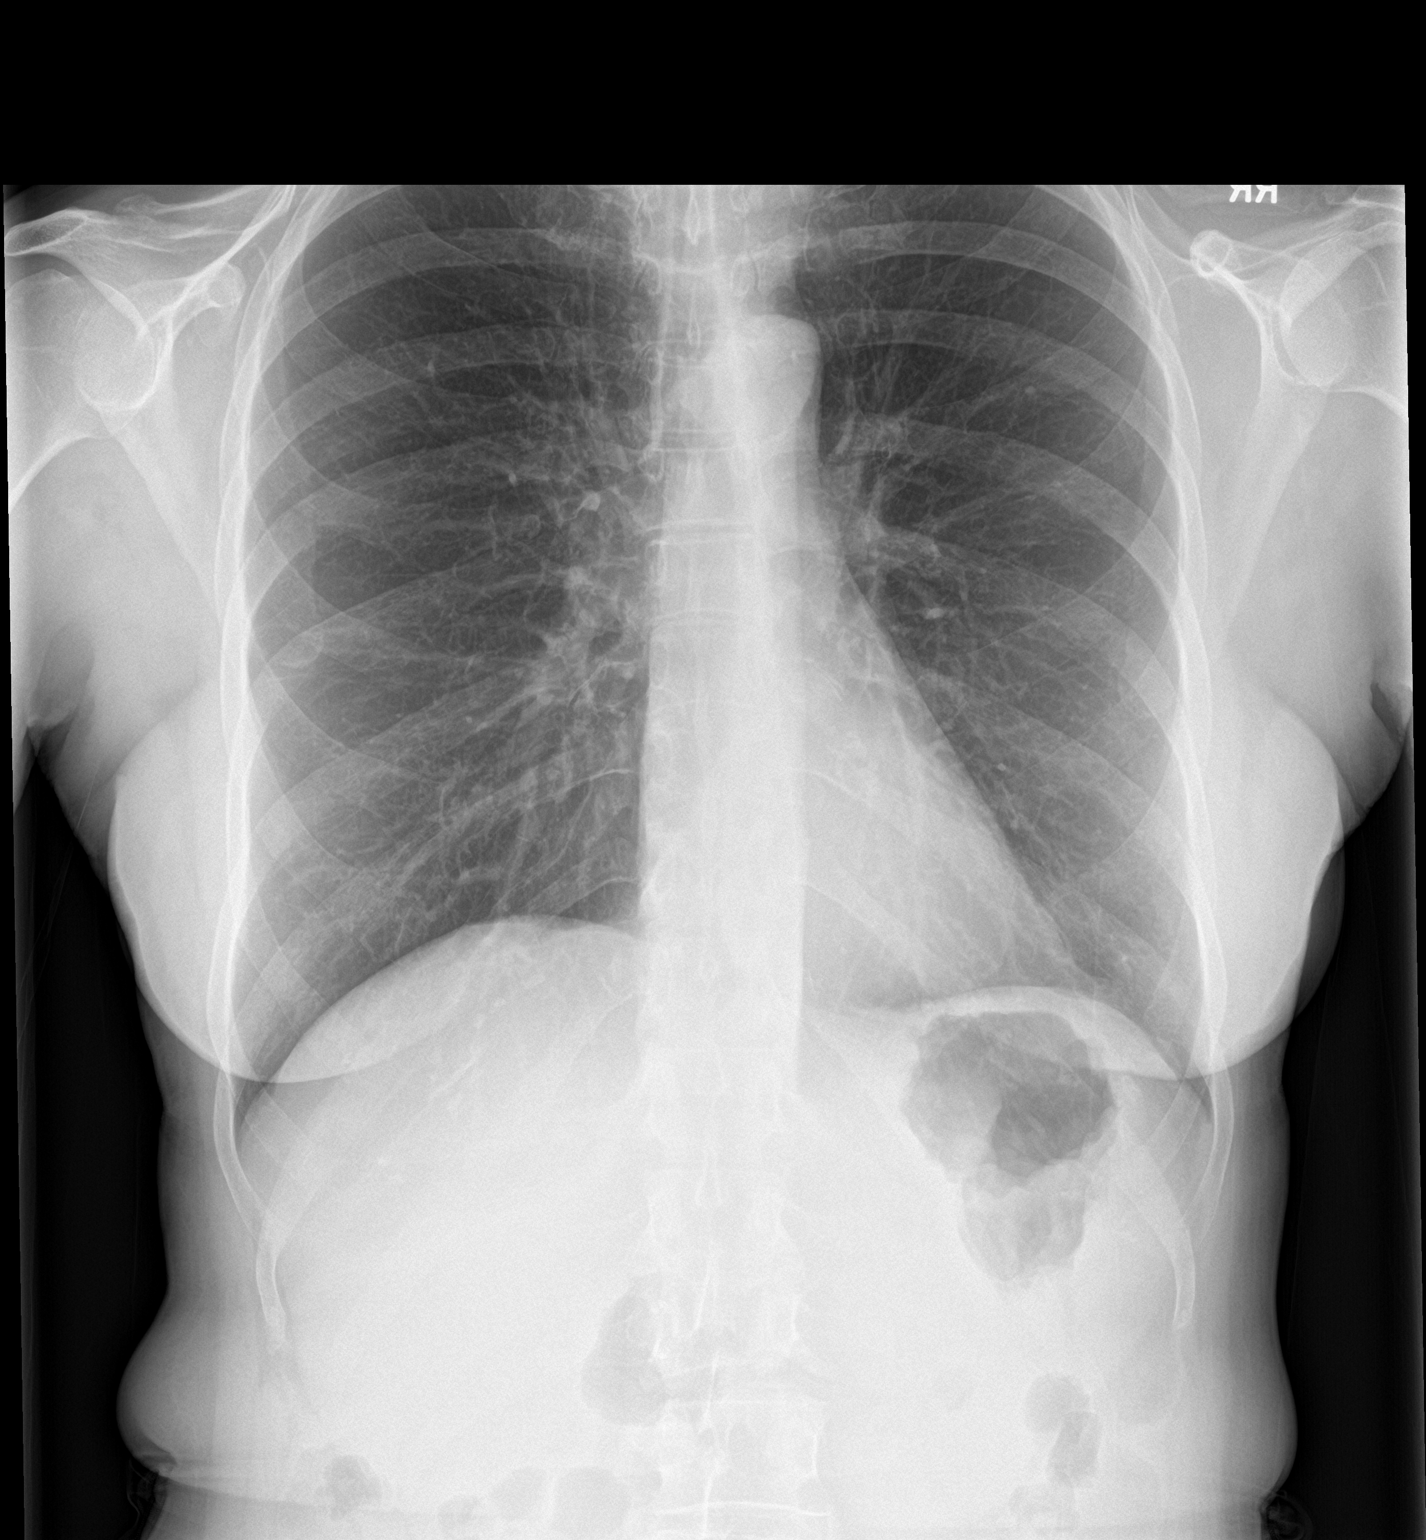

[chest lat]
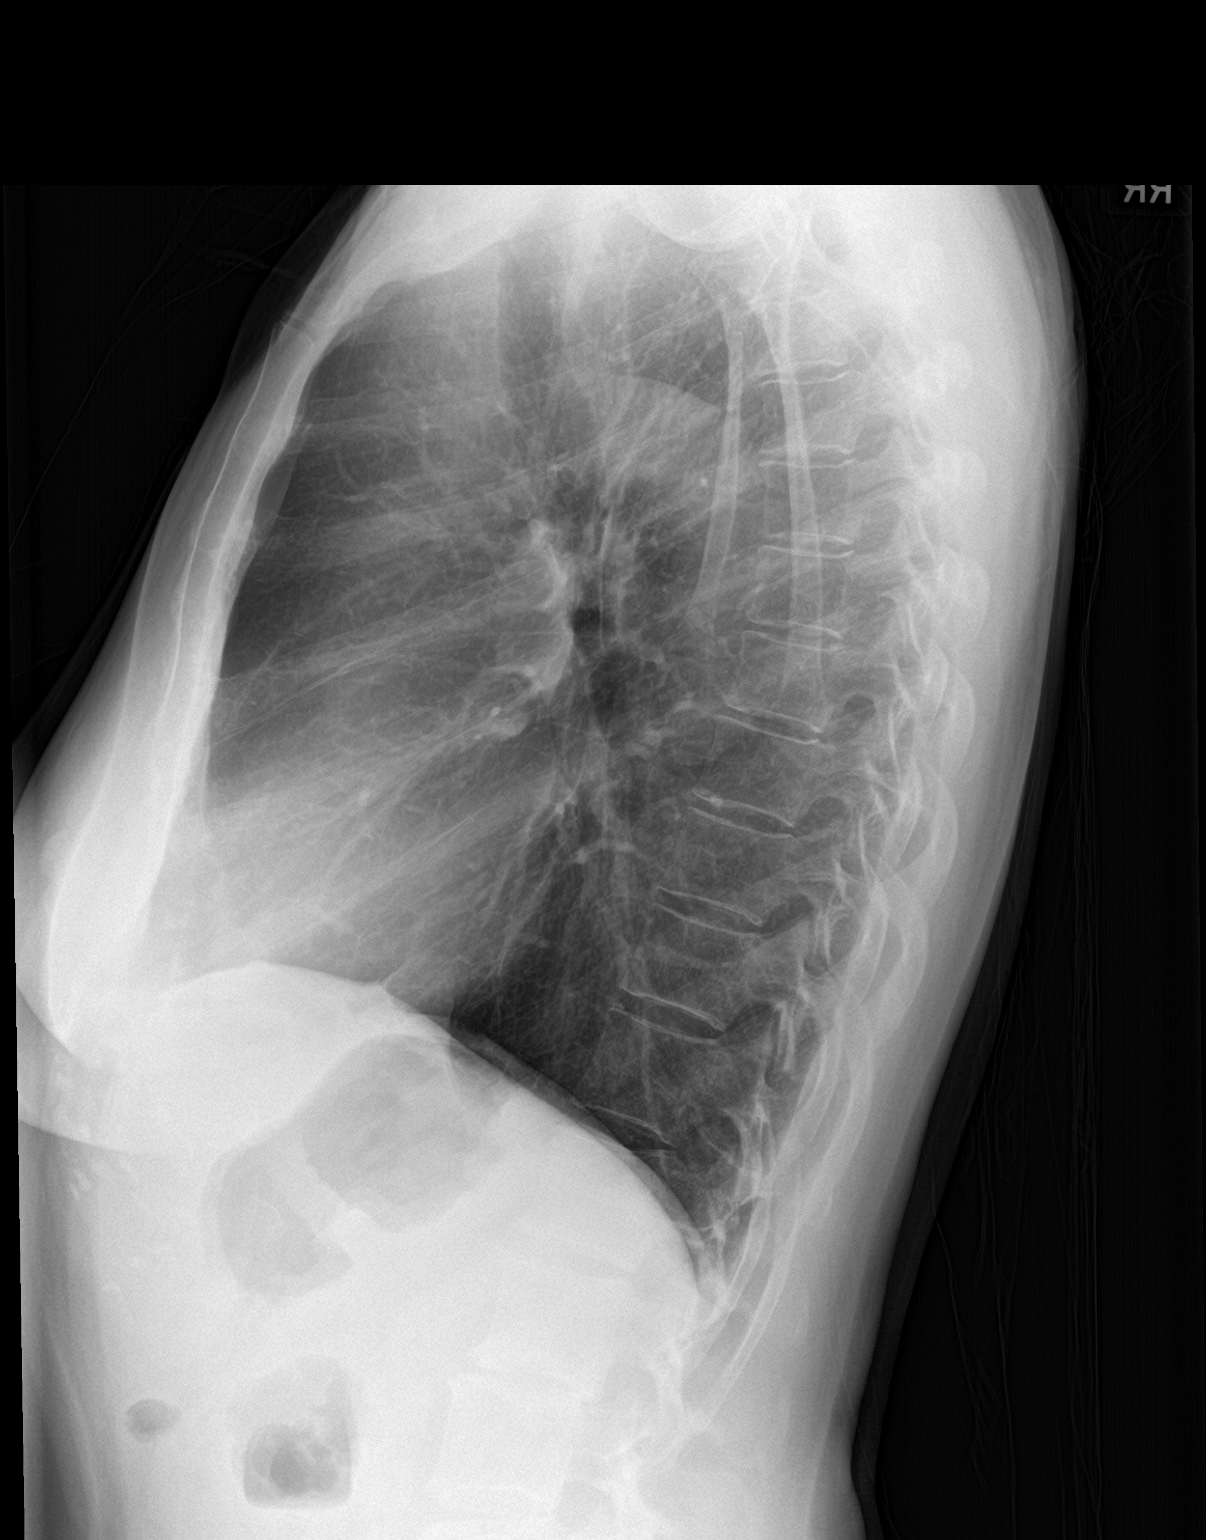

[2 of 2 positions shown; findings below may reference images not displayed]

FINDINGS: No consolidation. No visible pleural effusions or pneumothorax.
Cardiomediastinal silhouette is within normal limits.
IMPRESSION: No evidence of acute cardiopulmonary disease.

## 2023-06-12 ENCOUNTER — Encounter (INDEPENDENT_AMBULATORY_CARE_PROVIDER_SITE_OTHER): Payer: Self-pay

## 2023-07-03 DIAGNOSIS — Z08 Encounter for follow-up examination after completed treatment for malignant neoplasm: Secondary | ICD-10-CM | POA: Diagnosis not present

## 2023-07-03 DIAGNOSIS — L814 Other melanin hyperpigmentation: Secondary | ICD-10-CM | POA: Diagnosis not present

## 2023-07-03 DIAGNOSIS — D225 Melanocytic nevi of trunk: Secondary | ICD-10-CM | POA: Diagnosis not present

## 2023-07-03 DIAGNOSIS — L821 Other seborrheic keratosis: Secondary | ICD-10-CM | POA: Diagnosis not present

## 2023-07-03 DIAGNOSIS — L538 Other specified erythematous conditions: Secondary | ICD-10-CM | POA: Diagnosis not present

## 2023-07-03 DIAGNOSIS — R208 Other disturbances of skin sensation: Secondary | ICD-10-CM | POA: Diagnosis not present

## 2023-07-03 DIAGNOSIS — L820 Inflamed seborrheic keratosis: Secondary | ICD-10-CM | POA: Diagnosis not present

## 2023-07-09 ENCOUNTER — Encounter: Payer: Medicare Other | Admitting: Family Medicine

## 2023-08-07 DIAGNOSIS — L538 Other specified erythematous conditions: Secondary | ICD-10-CM | POA: Diagnosis not present

## 2023-08-07 DIAGNOSIS — L820 Inflamed seborrheic keratosis: Secondary | ICD-10-CM | POA: Diagnosis not present

## 2023-08-07 DIAGNOSIS — R208 Other disturbances of skin sensation: Secondary | ICD-10-CM | POA: Diagnosis not present

## 2023-08-07 DIAGNOSIS — L298 Other pruritus: Secondary | ICD-10-CM | POA: Diagnosis not present

## 2023-08-09 DIAGNOSIS — H9201 Otalgia, right ear: Secondary | ICD-10-CM | POA: Diagnosis not present

## 2023-08-09 DIAGNOSIS — J019 Acute sinusitis, unspecified: Secondary | ICD-10-CM | POA: Diagnosis not present

## 2023-08-30 ENCOUNTER — Encounter: Payer: Self-pay | Admitting: Family Medicine

## 2023-08-30 ENCOUNTER — Ambulatory Visit: Payer: Medicare Other | Admitting: Family Medicine

## 2023-08-30 VITALS — BP 140/90 | HR 80 | Temp 98.1°F | Resp 18 | Ht 66.0 in | Wt 122.8 lb

## 2023-08-30 DIAGNOSIS — J0140 Acute pansinusitis, unspecified: Secondary | ICD-10-CM | POA: Diagnosis not present

## 2023-08-30 DIAGNOSIS — J301 Allergic rhinitis due to pollen: Secondary | ICD-10-CM

## 2023-08-30 MED ORDER — FLUTICASONE PROPIONATE 50 MCG/ACT NA SUSP: 2.0000 | Freq: Every day | NASAL | 6 refills | Status: DC

## 2023-08-30 MED ORDER — AZELASTINE HCL 0.1 % NA SOLN: 2.0000 | Freq: Two times a day (BID) | NASAL | 12 refills | Status: DC

## 2023-08-30 MED ORDER — PROMETHAZINE-DM 6.25-15 MG/5ML PO SYRP
5.0000 mL | ORAL_SOLUTION | Freq: Four times a day (QID) | ORAL | 0 refills | Status: DC | PRN
Start: 2023-08-30 — End: 2023-09-12

## 2023-08-30 MED ORDER — AMOXICILLIN-POT CLAVULANATE 875-125 MG PO TABS
1.0000 | ORAL_TABLET | Freq: Two times a day (BID) | ORAL | 0 refills | Status: DC
Start: 2023-08-30 — End: 2023-09-12

## 2023-08-30 NOTE — Progress Notes (Signed)
Established Patient Office Visit  Subjective   Patient ID: Sherri Hoffman, female    DOB: 1956-02-07  Age: 67 y.o. MRN: [id number]  Chief Complaint  Patient presents with   Sinus Problem    Sxs going on for several weeks and while she was in [place]. Pt states productive cough, sinus fullness and ear pressure. Covid test on Sunday and was negative. Pt states using Flonase and Azelastine. No otc    HPI Discussed the use of AI scribe software for clinical note transcription with the patient, who gave verbal consent to proceed.  History of Present Illness   The patient, with a history of yearly sinus issues, presents with a cough and throat discomfort. She reports that her symptoms began while on a trip to [place], where she experienced high pollen levels and extreme heat. Despite a negative COVID-19 test, she is concerned about her symptoms, particularly as she had a previous COVID-19 infection in February. She has been using nasal sprays, including Astelin and Flonase, for symptom management but is running low on Astelin. She also requests a cough medicine, specifying that she cannot take hydrocortisone.        Review of Systems  Constitutional:  Negative for fever and malaise/fatigue.  HENT:  Positive for congestion and sinus pain.   Eyes:  Negative for blurred vision.  Respiratory:  Positive for cough. Negative for shortness of breath.   Cardiovascular:  Negative for chest pain, palpitations and leg swelling.  Gastrointestinal:  Negative for abdominal pain, blood in stool and nausea.  Genitourinary:  Negative for dysuria and frequency.  Musculoskeletal:  Negative for falls.  Skin:  Negative for rash.  Neurological:  Negative for dizziness, loss of consciousness and headaches.  Endo/Heme/Allergies:  Negative for environmental allergies.  Psychiatric/Behavioral:  Negative for depression. The patient is not nervous/anxious.   All other systems reviewed and are negative.      Objective:     BP (!) 140/90 (BP Location: Right Arm, Patient Position: Sitting, Cuff Size: Normal)   Pulse 80   Temp 98.1 F (36.7 C) (Oral)   Resp 18   Ht 5\' 6"  (1.676 m)   Wt 122 lb 12.8 oz (55.7 kg)   SpO2 98%   BMI 19.82 kg/m    Physical Exam Vitals and nursing note reviewed.  HENT:     Nose:     Right Sinus: Maxillary sinus tenderness and frontal sinus tenderness present.     Left Sinus: Maxillary sinus tenderness and frontal sinus tenderness present.      No results found for any visits on 08/30/23.    The 10-year ASCVD risk score (Arnett DK, et al., 2019) is: 10.7%    Assessment & Plan:   Problem List Items Addressed This Visit       Unprioritized   Pansinusitis - Primary   Relevant Medications   amoxicillin-clavulanate (AUGMENTIN) 875-125 MG tablet   azelastine (ASTELIN) 0.1 % nasal spray   fluticasone (FLONASE) 50 MCG/ACT nasal spray   promethazine-dextromethorphan (PROMETHAZINE-DM) 6.25-15 MG/5ML syrup   Seasonal allergic rhinitis due to pollen   Relevant Medications   fluticasone (FLONASE) 50 MCG/ACT nasal spray  Assessment and Plan    Sinusitis Symptoms of sinusitis following travel to [place], now with cough. No fever or other systemic symptoms. Negative COVID-19 test. -Prescribe Augmentin for sinusitis. -Prescribe cough syrup. -Continue Astelin and Flonase nasal sprays, refill prescriptions.  Follow-up in 10 days for routine blood work.        No  follow-ups on file.    Donato Schultz, DO

## 2023-09-04 NOTE — Patient Instructions (Signed)

## 2023-09-12 ENCOUNTER — Encounter: Payer: Self-pay | Admitting: Family Medicine

## 2023-09-12 ENCOUNTER — Ambulatory Visit: Payer: Medicare Other | Admitting: Family Medicine

## 2023-09-12 VITALS — BP 140/80 | HR 73 | Temp 98.1°F | Resp 18 | Ht 66.0 in | Wt 121.6 lb

## 2023-09-12 DIAGNOSIS — Z23 Encounter for immunization: Secondary | ICD-10-CM | POA: Diagnosis not present

## 2023-09-12 DIAGNOSIS — Z0000 Encounter for general adult medical examination without abnormal findings: Secondary | ICD-10-CM

## 2023-09-12 DIAGNOSIS — E559 Vitamin D deficiency, unspecified: Secondary | ICD-10-CM | POA: Diagnosis not present

## 2023-09-12 DIAGNOSIS — I10 Essential (primary) hypertension: Secondary | ICD-10-CM

## 2023-09-12 DIAGNOSIS — N950 Postmenopausal bleeding: Secondary | ICD-10-CM | POA: Diagnosis not present

## 2023-09-12 LAB — COMPREHENSIVE METABOLIC PANEL
ALT: 16 U/L (ref 0–35)
AST: 18 U/L (ref 0–37)
Albumin: 4.5 g/dL (ref 3.5–5.2)
Alkaline Phosphatase: 41 U/L (ref 39–117)
BUN: 18 mg/dL (ref 6–23)
CO2: 29 meq/L (ref 19–32)
Calcium: 9.7 mg/dL (ref 8.4–10.5)
Chloride: 101 meq/L (ref 96–112)
Creatinine, Ser: 0.55 mg/dL (ref 0.40–1.20)
GFR: 94.03 mL/min (ref >60.00)
Glucose, Bld: 96 mg/dL (ref 70–99)
Potassium: 4.3 meq/L (ref 3.5–5.1)
Sodium: 139 meq/L (ref 135–145)
Total Bilirubin: 0.6 mg/dL (ref 0.2–1.2)
Total Protein: 7.3 g/dL (ref 6.0–8.3)

## 2023-09-12 LAB — LIPID PANEL
Cholesterol: 203 mg/dL — ABNORMAL HIGH (ref 0–200)
HDL: 87.9 mg/dL (ref >39.00)
LDL Cholesterol: 86 mg/dL (ref 0–99)
NonHDL: 114.73
Total CHOL/HDL Ratio: 2
Triglycerides: 145 mg/dL (ref 0.0–149.0)
VLDL: 29 mg/dL (ref 0.0–40.0)

## 2023-09-12 LAB — VITAMIN D 25 HYDROXY (VIT D DEFICIENCY, FRACTURES): VITD: 49.61 ng/mL (ref 30.00–100.00)

## 2023-09-12 LAB — CBC WITH DIFFERENTIAL/PLATELET
Basophils Absolute: 0 10*3/uL (ref 0.0–0.1)
Basophils Relative: 1 % (ref 0.0–3.0)
Eosinophils Absolute: 0.1 10*3/uL (ref 0.0–0.7)
Eosinophils Relative: 2.4 % (ref 0.0–5.0)
HCT: 41.2 % (ref 36.0–46.0)
Hemoglobin: 13.5 g/dL (ref 12.0–15.0)
Lymphocytes Relative: 33.5 % (ref 12.0–46.0)
Lymphs Abs: 1.5 10*3/uL (ref 0.7–4.0)
MCHC: 32.7 g/dL (ref 30.0–36.0)
MCV: 94.5 fL (ref 78.0–100.0)
Monocytes Absolute: 0.4 10*3/uL (ref 0.1–1.0)
Monocytes Relative: 9.3 % (ref 3.0–12.0)
Neutro Abs: 2.5 10*3/uL (ref 1.4–7.7)
Neutrophils Relative %: 53.8 % (ref 43.0–77.0)
Platelets: 307 10*3/uL (ref 150.0–400.0)
RBC: 4.36 Mil/uL (ref 3.87–5.11)
RDW: 13.7 % (ref 11.5–15.5)
WBC: 4.6 10*3/uL (ref 4.0–10.5)

## 2023-09-12 LAB — TSH: TSH: 2.22 u[IU]/mL (ref 0.35–5.50)

## 2023-09-12 MED ORDER — VALSARTAN-HYDROCHLOROTHIAZIDE 320-25 MG PO TABS: 1.0000 | ORAL_TABLET | Freq: Every day | ORAL | 1 refills | Status: DC

## 2023-09-12 MED ORDER — POTASSIUM CHLORIDE CRYS ER 10 MEQ PO TBCR: EXTENDED_RELEASE_TABLET | ORAL | 1 refills | Status: DC

## 2023-09-12 NOTE — Patient Instructions (Signed)
Preventive Care 65 Years and Older, Female Preventive care refers to lifestyle choices and visits with your health care provider that can promote health and wellness. Preventive care visits are also called wellness exams. What can I expect for my preventive care visit? Counseling Your health care provider may ask you questions about your: Medical history, including: Past medical problems. Family medical history. Pregnancy and menstrual history. History of falls. Current health, including: Memory and ability to understand (cognition). Emotional well-being. Home life and relationship well-being. Sexual activity and sexual health. Lifestyle, including: Alcohol, nicotine or tobacco, and drug use. Access to firearms. Diet, exercise, and sleep habits. Work and work environment. Sunscreen use. Safety issues such as seatbelt and bike helmet use. Physical exam Your health care provider will check your: Height and weight. These may be used to calculate your BMI (body mass index). BMI is a measurement that tells if you are at a healthy weight. Waist circumference. This measures the distance around your waistline. This measurement also tells if you are at a healthy weight and may help predict your risk of certain diseases, such as type 2 diabetes and high blood pressure. Heart rate and blood pressure. Body temperature. Skin for abnormal spots. What immunizations do I need?  Vaccines are usually given at various ages, according to a schedule. Your health care provider will recommend vaccines for you based on your age, medical history, and lifestyle or other factors, such as travel or where you work. What tests do I need? Screening Your health care provider may recommend screening tests for certain conditions. This may include: Lipid and cholesterol levels. Hepatitis C test. Hepatitis B test. HIV (human immunodeficiency virus) test. STI (sexually transmitted infection) testing, if you are at  risk. Lung cancer screening. Colorectal cancer screening. Diabetes screening. This is done by checking your blood sugar (glucose) after you have not eaten for a while (fasting). Mammogram. Talk with your health care provider about how often you should have regular mammograms. BRCA-related cancer screening. This may be done if you have a family history of breast, ovarian, tubal, or peritoneal cancers. Bone density scan. This is done to screen for osteoporosis. Talk with your health care provider about your test results, treatment options, and if necessary, the need for more tests. Follow these instructions at home: Eating and drinking  Eat a diet that includes fresh fruits and vegetables, whole grains, lean protein, and low-fat dairy products. Limit your intake of foods with high amounts of sugar, saturated fats, and salt. Take vitamin and mineral supplements as recommended by your health care provider. Do not drink alcohol if your health care provider tells you not to drink. If you drink alcohol: Limit how much you have to 0-1 drink a day. Know how much alcohol is in your drink. In the [place] one drink equals one 12 oz bottle of beer (355 mL), one 5 oz glass of wine (148 mL), or one 1 oz glass of hard liquor (44 mL). Lifestyle Brush your teeth every morning and night with fluoride toothpaste. Floss one time each day. Exercise for at least 30 minutes 5 or more days each week. Do not use any products that contain nicotine or tobacco. These products include cigarettes, chewing tobacco, and vaping devices, such as e-cigarettes. If you need help quitting, ask your health care provider. Do not use drugs. If you are sexually active, practice safe sex. Use a condom or other form of protection in order to prevent STIs. Take aspirin only as told by   your health care provider. Make sure that you understand how much to take and what form to take. Work with your health care provider to find out whether it  is safe and beneficial for you to take aspirin daily. Ask your health care provider if you need to take a cholesterol-lowering medicine (statin). Find healthy ways to manage stress, such as: Meditation, yoga, or listening to music. Journaling. Talking to a trusted person. Spending time with friends and family. Minimize exposure to UV radiation to reduce your risk of skin cancer. Safety Always wear your seat belt while driving or riding in a vehicle. Do not drive: If you have been drinking alcohol. Do not ride with someone who has been drinking. When you are tired or distracted. While texting. If you have been using any mind-altering substances or drugs. Wear a helmet and other protective equipment during sports activities. If you have firearms in your house, make sure you follow all gun safety procedures. What's next? Visit your health care provider once a year for an annual wellness visit. Ask your health care provider how often you should have your eyes and teeth checked. Stay up to date on all vaccines. This information is not intended to replace advice given to you by your health care provider. Make sure you discuss any questions you have with your health care provider. Document Revised: 04/26/2021 Document Reviewed: 04/26/2021 Elsevier Patient Education  2024 [employer] Inc.  

## 2023-09-12 NOTE — Assessment & Plan Note (Signed)
Well controlled, no changes to meds. Encouraged heart healthy diet such as the DASH diet and exercise as tolerated.  °

## 2023-09-12 NOTE — Assessment & Plan Note (Signed)
Ghm utd Check labs  See AVS  Health Maintenance  Topic Date Due   Zoster Vaccines- Shingrix (1 of 2) 01/13/1974   DTaP/Tdap/Td (2 - Tdap) 03/17/2017   DEXA SCAN  Never done   COVID-19 Vaccine (3 - Moderna risk series) 02/11/2020   Medicare Annual Wellness (AWV)  11/22/2023   MAMMOGRAM  04/10/2025   Colonoscopy  08/11/2028   Pneumonia Vaccine 54+ Years old  Completed   INFLUENZA VACCINE  Completed   Hepatitis C Screening  Completed   HPV VACCINES  Aged Out

## 2023-09-12 NOTE — Assessment & Plan Note (Signed)
See derm regularly

## 2023-09-12 NOTE — Progress Notes (Signed)
+  Established Patient Office Visit  Subjective   Patient ID: JEEYA DERUYTER, female    DOB: 06-02-1956  Age: 67 y.o. MRN: [id number]  Chief Complaint  Patient presents with   Annual Exam    Pt states fasting     HPI Discussed the use of AI scribe software for clinical note transcription with the patient, who gave verbal consent to proceed.  History of Present Illness   The patient, with a history of hyperlipidemia, presents for routine follow-up. She reports feeling much better since her last visit when she was experiencing sinus symptoms. She has stopped taking Lipitor due to sleep disturbances, which she attributes to the medication. She reports that her cholesterol levels are not high, but she had started taking Lipitor as a preventive measure after a calcium score of nine. She is open to resuming lipid-lowering therapy if her cholesterol levels increase significantly.  The patient also discusses her vaccination status. She is considering getting the flu shot but is unsure about the potential side effects. She acknowledges the need for the shingles vaccine, especially after seeing a close acquaintance suffer from recurrent shingles. She is also due for a tetanus vaccine.  In addition, the patient mentions occasional episodes of heartburn-like symptoms, which she manages by burping. She is interested in over-the-counter options for managing these symptoms.      Patient Active Problem List   Diagnosis Date Noted   Seasonal allergic rhinitis due to pollen 01/01/2023   Vitamin D deficiency 01/01/2023   Need for pneumococcal vaccination 06/13/2021   Left ear impacted cerumen 12/22/2020   Primary hypertension 11/26/2017   Preventative health care 11/26/2017   Pansinusitis 01/20/2017   Osteopenia 07/19/2016   ALLERGIC RHINITIS CAUSE UNSPECIFIED 09/13/2010   NEVI, MULTIPLE 07/01/2009   Chronic cough 06/18/2008   A A A 04/08/2007   Essential hypertension 03/18/2007   SHOULDER PAIN,  LEFT 03/18/2007   SKIN CANCER, HX OF 03/18/2007   CARCINOMA, BASAL CELL 03/14/2007   Past Medical History:  Diagnosis Date   Hypertension    Skin cancer, basal cell    Past Surgical History:  Procedure Laterality Date   KNEE ARTHROSCOPY Left 03/06/2022   [place]io   MYOMECTOMY     Social History   Tobacco Use   Smoking status: Never   Smokeless tobacco: Never  Substance Use Topics   Alcohol use: Yes    Alcohol/week: 3.0 standard drinks of alcohol    Types: 3 Glasses of wine per week   Drug use: No   Social History   Socioeconomic History   Marital status: Married    Spouse name: Not on file   Number of children: Not on file   Years of education: Not on file   Highest education level: Not on file  Occupational History   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  Tobacco Use   Smoking status: Never   Smokeless tobacco: Never  Substance and Sexual Activity   Alcohol use: Yes    Alcohol/week: 3.0 standard drinks of alcohol    Types: 3 Glasses of wine per week   Drug use: No   Sexual activity: Yes    Partners: Male    Birth control/protection: Post-menopausal  Other Topics Concern   Not on file  Social History Narrative   Not on file   Social Determinants of Health   Financial Resource Strain: Low Risk  (11/21/2022)   Overall Financial Resource Strain (CARDIA)    Difficulty of Paying  Living Expenses: Not hard at all  Food Insecurity: No Food Insecurity (11/21/2022)   Hunger Vital Sign    Worried About Running Out of Food in the Last Year: Never true    Ran Out of Food in the Last Year: Never true  Transportation Needs: No Transportation Needs (11/21/2022)   PRAPARE - Administrator, Civil Service (Medical): No    Lack of Transportation (Non-Medical): No  Physical Activity: Sufficiently Active (11/21/2022)   Exercise Vital Sign    Days of Exercise per Week: 5 days    Minutes of Exercise per Session: 90 min  Stress: No Stress Concern Present  (11/21/2022)   [employer] of Occupational Health - Occupational Stress Questionnaire    Feeling of Stress : Not at all  Social Connections: Moderately Integrated (11/21/2022)   Social Connection and Isolation Panel [NHANES]    Frequency of Communication with Friends and Family: More than three times a week    Frequency of Social Gatherings with Friends and Family: More than three times a week    Attends Religious Services: 1 to 4 times per year    Active Member of [employer] or Organizations: No    Attends Banker Meetings: Never    Marital Status: Married  Catering manager Violence: Not At Risk (11/21/2022)   Humiliation, Afraid, Rape, and Kick questionnaire    Fear of Current or Ex-Partner: No    Emotionally Abused: No    Physically Abused: No    Sexually Abused: No   Family Status  Relation Name Status   Mother  Deceased at age 86       pneumonia, copd,  chf   Father  Deceased at age 25       chf   Sister  Alive       severe anemia-- etiology unknown   Other  (Not Specified)   Brother  (Not Specified)  No partnership data on file   Family History  Problem Relation Age of Onset   Heart disease Mother    Diabetes Mother    Hyperlipidemia Mother    COPD Mother    Cancer Father        melanoma   Heart disease Father        chf   COPD Father    Skin cancer Other        Melanoma   Coronary artery disease Other    COPD Other    Cancer Other        skin, melanoma   Depression Brother    Allergies  Allergen Reactions   Ace Inhibitors     REACTION: COUGH   Erythromycin     REACTION: NAUSEA   Hydrocodone Bit-Homatrop Mbr     Nausea/vomitting   Tussionex Pennkinetic Er [Hydrocod Poli-Chlorphe Poli Er]     nausea      ROS    Objective:     BP (!) 140/80 (BP Location: Right Arm, Patient Position: Sitting, Cuff Size: Normal)   Pulse 73   Temp 98.1 F (36.7 C) (Oral)   Resp 18   Ht 5\' 6"  (1.676 m)   Wt 121 lb 9.6 oz (55.2 kg)   SpO2 98%   BMI  19.63 kg/m  BP Readings from Last 3 Encounters:  09/12/23 (!) 140/80  08/30/23 (!) 140/90  01/01/23 132/80   Wt Readings from Last 3 Encounters:  09/12/23 121 lb 9.6 oz (55.2 kg)  08/30/23 122 lb 12.8 oz (55.7 kg)  01/01/23 124 lb 3.2 oz (56.3 kg)   SpO2 Readings from Last 3 Encounters:  09/12/23 98%  08/30/23 98%  01/01/23 98%      Physical Exam   No results found for any visits on 09/12/23.  Last CBC Lab Results  Component Value Date   WBC 4.7 01/01/2023   HGB 13.6 01/01/2023   HCT 41.2 01/01/2023   MCV 93.7 01/01/2023   RDW 13.4 01/01/2023   PLT 260.0 01/01/2023   Last metabolic panel Lab Results  Component Value Date   GLUCOSE 92 01/01/2023   NA 138 01/01/2023   K 4.4 01/01/2023   CL 101 01/01/2023   CO2 28 01/01/2023   BUN 21 01/01/2023   CREATININE 0.55 01/01/2023   GFR 94.49 01/01/2023   CALCIUM 9.8 01/01/2023   PROT 7.1 01/01/2023   ALBUMIN 4.3 01/01/2023   BILITOT 0.5 01/01/2023   ALKPHOS 36 (L) 01/01/2023   AST 16 01/01/2023   ALT 11 01/01/2023   Last lipids Lab Results  Component Value Date   CHOL 179 01/01/2023   HDL 74.90 01/01/2023   LDLCALC 70 01/01/2023   TRIG 171.0 (H) 01/01/2023   CHOLHDL 2 01/01/2023   Last hemoglobin A1c Lab Results  Component Value Date   HGBA1C 5.5 01/11/2014   Last thyroid functions Lab Results  Component Value Date   TSH 2.82 01/01/2023   Last vitamin D Lab Results  Component Value Date   VD25OH 38.32 01/01/2023   Last vitamin B12 and Folate No results found for: "VITAMINB12", "FOLATE"    The 10-year ASCVD risk score (Arnett DK, et al., 2019) is: 10.7%    Assessment & Plan:   Problem List Items Addressed This Visit       Unprioritized   Primary hypertension   Relevant Medications   valsartan-hydrochlorothiazide (DIOVAN-HCT) 320-25 MG tablet   potassium chloride (KLOR-CON M) 10 MEQ tablet   Other Relevant Orders   CBC with Differential/Platelet   Comprehensive metabolic panel    Lipid panel   TSH   Lipoprotein A (LPA)   Apolipoprotein B   Vitamin D deficiency   Relevant Orders   VITAMIN D 25 Hydroxy (Vit-D Deficiency, Fractures)   Preventative health care - Primary    Ghm utd Check labs  See AVS  Health Maintenance  Topic Date Due   Zoster Vaccines- Shingrix (1 of 2) 01/13/1974   DTaP/Tdap/Td (2 - Tdap) 03/17/2017   DEXA SCAN  Never done   COVID-19 Vaccine (3 - Moderna risk series) 02/11/2020   Medicare Annual Wellness (AWV)  11/22/2023   MAMMOGRAM  04/10/2025   Colonoscopy  08/11/2028   Pneumonia Vaccine 47+ Years old  Completed   INFLUENZA VACCINE  Completed   Hepatitis C Screening  Completed   HPV VACCINES  Aged Out         Essential hypertension    Well controlled, no changes to meds. Encouraged heart healthy diet such as the DASH diet and exercise as tolerated.        Relevant Medications   valsartan-hydrochlorothiazide (DIOVAN-HCT) 320-25 MG tablet   potassium chloride (KLOR-CON M) 10 MEQ tablet   Other Visit Diagnoses     Need for influenza vaccination       Relevant Orders   Flu Vaccine Trivalent High Dose (Fluad) (Completed)     Assessment and Plan    Hyperlipidemia Patient stopped Lipitor due to perceived side effects. Last cholesterol check was normal while on medication. Discussed the benefits of statin therapy given her  calcium score of 9, but patient prefers to monitor cholesterol levels off medication. -Check lipid panel today. -Consider resuming statin therapy at a lower dose or frequency if cholesterol levels are elevated.  Occasional Dyspepsia Patient reports occasional burping and heartburn. -Recommended over-the-counter Pepcid as needed.  General Health Maintenance -Administer influenza vaccine today. -Encouraged patient to receive tetanus and shingles vaccines at the pharmacy. -Continue regular eye and dental check-ups. -Next mammogram due in May. -Last bone density scan was last year, next due in a year.  Patient is taking Vitamin D and calcium supplements.        Return in about 6 months (around 03/11/2024), or if symptoms worsen or fail to improve, for hypertension.    Donato Schultz, DO

## 2023-09-13 LAB — APOLIPOPROTEIN B: Apolipoprotein B: 80 mg/dL (ref <90)

## 2023-09-15 LAB — LIPOPROTEIN A (LPA): Lipoprotein (a): <10 nmol/L (ref <75)

## 2023-09-17 ENCOUNTER — Other Ambulatory Visit: Payer: Self-pay | Admitting: Family Medicine

## 2023-09-17 DIAGNOSIS — J301 Allergic rhinitis due to pollen: Secondary | ICD-10-CM

## 2023-09-19 DIAGNOSIS — N950 Postmenopausal bleeding: Secondary | ICD-10-CM | POA: Diagnosis not present

## 2023-09-19 DIAGNOSIS — R9389 Abnormal findings on diagnostic imaging of other specified body structures: Secondary | ICD-10-CM | POA: Diagnosis not present

## 2023-10-01 DIAGNOSIS — H524 Presbyopia: Secondary | ICD-10-CM | POA: Diagnosis not present

## 2023-10-07 DIAGNOSIS — M1711 Unilateral primary osteoarthritis, right knee: Secondary | ICD-10-CM | POA: Diagnosis not present

## 2023-10-07 DIAGNOSIS — M25561 Pain in right knee: Secondary | ICD-10-CM | POA: Diagnosis not present

## 2023-11-01 ENCOUNTER — Other Ambulatory Visit: Payer: Self-pay | Admitting: Family Medicine

## 2023-11-01 DIAGNOSIS — I10 Essential (primary) hypertension: Secondary | ICD-10-CM

## 2023-11-08 ENCOUNTER — Telehealth: Payer: Self-pay | Admitting: Family Medicine

## 2023-11-08 NOTE — Telephone Encounter (Signed)
Copied from CRM [id number]. Topic: Medicare AWV >> Nov 08, 2023 11:48 AM Payton Doughty wrote: Reason for CRM: Called LVM 11/08/2023 to schedule AWV. Please schedule Virtual or Telehealth visits ONLY.   Verlee Rossetti; Care Guide Ambulatory Clinical Support [place] [facility] Direct Dial: [phone]

## 2023-12-24 ENCOUNTER — Ambulatory Visit (INDEPENDENT_AMBULATORY_CARE_PROVIDER_SITE_OTHER): Payer: Medicare Other | Admitting: Family Medicine

## 2023-12-24 ENCOUNTER — Encounter: Payer: Self-pay | Admitting: Family Medicine

## 2023-12-24 VITALS — BP 160/88 | HR 74 | Temp 98.3°F | Resp 16 | Ht 66.0 in | Wt 123.2 lb

## 2023-12-24 DIAGNOSIS — J0140 Acute pansinusitis, unspecified: Secondary | ICD-10-CM

## 2023-12-24 MED ORDER — AMOXICILLIN-POT CLAVULANATE 875-125 MG PO TABS: 1.0000 | ORAL_TABLET | Freq: Two times a day (BID) | ORAL | 0 refills | Status: DC

## 2023-12-24 NOTE — Progress Notes (Unsigned)
Established Patient Office Visit  Subjective   Patient ID: Sherri Hoffman, female    DOB: 1956-08-15  Age: 68 y.o. MRN: [id number]  Chief Complaint  Patient presents with   Sinus Problem    X2 weeks, pt states sinus pressure, watery eyes, no sore throat, clearing throat a lot.     HPI Discussed the use of AI scribe software for clinical note transcription with the patient, who gave verbal consent to proceed.  History of Present Illness   Sherri Hoffman is a 68 year old female who presents with symptoms of a sinus infection.  She has been experiencing symptoms consistent with a sinus infection for the past two weeks, localized in her head, with significant eye watering and some drainage causing a mild cough at night. No fever, sore throat, or chest congestion. Her ears feel as if she is in a fog or tunnel.  She has been using her nasal sprays regularly but avoids other medications like Benadryl due to adverse effects such as heart racing. Her symptoms began while she was at the beach and have not improved since then.  Her usual treatment for similar symptoms in the past has been Augmentin, which she finds effective. She recently refilled her Flonase prescription and is not in need of any other medications at this time.      Patient Active Problem List   Diagnosis Date Noted   Seasonal allergic rhinitis due to pollen 01/01/2023   Vitamin D deficiency 01/01/2023   Need for pneumococcal vaccination 06/13/2021   Left ear impacted cerumen 12/22/2020   Primary hypertension 11/26/2017   Preventative health care 11/26/2017   Pansinusitis 01/20/2017   Osteopenia 07/19/2016   ALLERGIC RHINITIS CAUSE UNSPECIFIED 09/13/2010   NEVI, MULTIPLE 07/01/2009   Chronic cough 06/18/2008   A A A 04/08/2007   Essential hypertension 03/18/2007   SHOULDER PAIN, LEFT 03/18/2007   SKIN CANCER, HX OF 03/18/2007   CARCINOMA, BASAL CELL 03/14/2007   Past Medical History:  Diagnosis Date    Hypertension    Skin cancer, basal cell    Past Surgical History:  Procedure Laterality Date   KNEE ARTHROSCOPY Left 03/06/2022   [place]   MYOMECTOMY     Social History   Tobacco Use   Smoking status: Never   Smokeless tobacco: Never  Substance Use Topics   Alcohol use: Yes    Alcohol/week: 3.0 standard drinks of alcohol    Types: 3 Glasses of wine per week   Drug use: No   Social History   Socioeconomic History   Marital status: Married    Spouse name: Not on file   Number of children: Not on file   Years of education: Not on file   Highest education level: Not on file  Occupational History   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  Tobacco Use   Smoking status: Never   Smokeless tobacco: Never  Substance and Sexual Activity   Alcohol use: Yes    Alcohol/week: 3.0 standard drinks of alcohol    Types: 3 Glasses of wine per week   Drug use: No   Sexual activity: Yes    Partners: Male    Birth control/protection: Post-menopausal  Other Topics Concern   Not on file  Social History Narrative   Not on file   Social Drivers of Health   Financial Resource Strain: Low Risk  (11/21/2022)   Overall Financial Resource Strain (CARDIA)    Difficulty of Paying Living  Expenses: Not hard at all  Food Insecurity: No Food Insecurity (11/21/2022)   Hunger Vital Sign    Worried About Running Out of Food in the Last Year: Never true    Ran Out of Food in the Last Year: Never true  Transportation Needs: No Transportation Needs (11/21/2022)   PRAPARE - Administrator, Civil Service (Medical): No    Lack of Transportation (Non-Medical): No  Physical Activity: Sufficiently Active (11/21/2022)   Exercise Vital Sign    Days of Exercise per Week: 5 days    Minutes of Exercise per Session: 90 min  Stress: No Stress Concern Present (11/21/2022)   [employer] of Occupational Health - Occupational Stress Questionnaire    Feeling of Stress : Not at all  Social  Connections: Moderately Integrated (11/21/2022)   Social Connection and Isolation Panel [NHANES]    Frequency of Communication with Friends and Family: More than three times a week    Frequency of Social Gatherings with Friends and Family: More than three times a week    Attends Religious Services: 1 to 4 times per year    Active Member of [employer] or Organizations: No    Attends Banker Meetings: Never    Marital Status: Married  Catering manager Violence: Not At Risk (11/21/2022)   Humiliation, Afraid, Rape, and Kick questionnaire    Fear of Current or Ex-Partner: No    Emotionally Abused: No    Physically Abused: No    Sexually Abused: No   Family Status  Relation Name Status   Mother  Deceased at age 53       pneumonia, copd,  chf   Father  Deceased at age 59       chf   Sister  Alive       severe anemia-- etiology unknown   Other  (Not Specified)   Brother  (Not Specified)  No partnership data on file   Family History  Problem Relation Age of Onset   Heart disease Mother    Diabetes Mother    Hyperlipidemia Mother    COPD Mother    Cancer Father        melanoma   Heart disease Father        chf   COPD Father    Skin cancer Other        Melanoma   Coronary artery disease Other    COPD Other    Cancer Other        skin, melanoma   Depression Brother    Allergies  Allergen Reactions   Ace Inhibitors     REACTION: COUGH   Erythromycin     REACTION: NAUSEA   Hydrocodone Bit-Homatrop Mbr     Nausea/vomitting   Tussionex Pennkinetic Er [Hydrocod Poli-Chlorphe Poli Er]     nausea      Review of Systems  Constitutional:  Negative for fever and malaise/fatigue.  HENT:  Positive for congestion and sinus pain. Negative for sore throat.   Eyes:  Negative for blurred vision.  Respiratory:  Negative for cough and shortness of breath.   Cardiovascular:  Negative for chest pain, palpitations and leg swelling.  Gastrointestinal:  Negative for vomiting.   Musculoskeletal:  Negative for back pain.  Skin:  Negative for rash.  Neurological:  Negative for loss of consciousness and headaches.      Objective:     BP (!) 160/88 (BP Location: Left Arm, Patient Position: Sitting, Cuff Size: Normal)  Pulse 74   Temp 98.3 F (36.8 C) (Oral)   Resp 16   Ht 5\' 6"  (1.676 m)   Wt 123 lb 3.2 oz (55.9 kg)   SpO2 98%   BMI 19.89 kg/m  BP Readings from Last 3 Encounters:  12/24/23 (!) 160/88  09/12/23 (!) 140/80  08/30/23 (!) 140/90   Wt Readings from Last 3 Encounters:  12/24/23 123 lb 3.2 oz (55.9 kg)  09/12/23 121 lb 9.6 oz (55.2 kg)  08/30/23 122 lb 12.8 oz (55.7 kg)   SpO2 Readings from Last 3 Encounters:  12/24/23 98%  09/12/23 98%  08/30/23 98%      Physical Exam Vitals and nursing note reviewed.  Constitutional:      General: She is not in acute distress.    Appearance: Normal appearance. She is well-developed.  HENT:     Head: Normocephalic and atraumatic.     Nose:     Right Sinus: Maxillary sinus tenderness and frontal sinus tenderness present.     Left Sinus: Maxillary sinus tenderness and frontal sinus tenderness present.  Eyes:     General: No scleral icterus.       Right eye: No discharge.        Left eye: No discharge.  Cardiovascular:     Rate and Rhythm: Normal rate and regular rhythm.     Heart sounds: No murmur heard. Pulmonary:     Effort: Pulmonary effort is normal. No respiratory distress.     Breath sounds: Normal breath sounds.  Musculoskeletal:        General: Normal range of motion.     Cervical back: Normal range of motion and neck supple.     Right lower leg: No edema.     Left lower leg: No edema.  Skin:    General: Skin is warm and dry.  Neurological:     Mental Status: She is alert and oriented to person, place, and time.  Psychiatric:        Mood and Affect: Mood normal.        Behavior: Behavior normal.        Thought Content: Thought content normal.        Judgment: Judgment  normal.      No results found for any visits on 12/24/23.  Last CBC Lab Results  Component Value Date   WBC 4.6 09/12/2023   HGB 13.5 09/12/2023   HCT 41.2 09/12/2023   MCV 94.5 09/12/2023   RDW 13.7 09/12/2023   PLT 307.0 09/12/2023   Last metabolic panel Lab Results  Component Value Date   GLUCOSE 96 09/12/2023   NA 139 09/12/2023   K 4.3 09/12/2023   CL 101 09/12/2023   CO2 29 09/12/2023   BUN 18 09/12/2023   CREATININE 0.55 09/12/2023   GFR 94.03 09/12/2023   CALCIUM 9.7 09/12/2023   PROT 7.3 09/12/2023   ALBUMIN 4.5 09/12/2023   BILITOT 0.6 09/12/2023   ALKPHOS 41 09/12/2023   AST 18 09/12/2023   ALT 16 09/12/2023   Last lipids Lab Results  Component Value Date   CHOL 203 (H) 09/12/2023   HDL 87.90 09/12/2023   LDLCALC 86 09/12/2023   TRIG 145.0 09/12/2023   CHOLHDL 2 09/12/2023   Last hemoglobin A1c Lab Results  Component Value Date   HGBA1C 5.5 01/11/2014   Last thyroid functions Lab Results  Component Value Date   TSH 2.22 09/12/2023   Last vitamin D Lab Results  Component Value Date  VD25OH 49.61 09/12/2023   Last vitamin B12 and Folate No results found for: "VITAMINB12", "FOLATE"    The 10-year ASCVD risk score (Arnett DK, et al., 2019) is: 13.8%    Assessment & Plan:   Problem List Items Addressed This Visit       Unprioritized   Pansinusitis - Primary   Relevant Medications   amoxicillin-clavulanate (AUGMENTIN) 875-125 MG tablet  Assessment and Plan    Sinusitis Sinusitis has persisted for two weeks with symptoms of congestion, sinus pain, watery eyes, and a mild nocturnal cough. There is no fever or sore throat. A history of effective response to Augmentin is noted, with no signs of flu or COVID-19 present. The risks of untreated sinusitis and the benefits of Augmentin, including its low risk of gastrointestinal upset, were discussed. Home testing for flu and COVID-19 was considered. Prescribe Augmentin. Recommend  over-the-counter COVID-19 and flu tests for home use. Advise on virtual visits for future needs.  General Health Maintenance Flonase is up-to-date. The importance of having medications and tests available during travel to manage potential illnesses was discussed. Ensure an adequate supply of Flonase. Advise to bring necessary medications and tests during travel.        Return in about 4 weeks (around 01/21/2024), or if symptoms worsen or fail to improve.    Donato Schultz, DO

## 2023-12-25 NOTE — Patient Instructions (Signed)

## 2024-02-17 ENCOUNTER — Other Ambulatory Visit: Payer: Self-pay | Admitting: Obstetrics and Gynecology

## 2024-02-17 DIAGNOSIS — M810 Age-related osteoporosis without current pathological fracture: Secondary | ICD-10-CM

## 2024-02-18 ENCOUNTER — Other Ambulatory Visit: Payer: Self-pay | Admitting: Obstetrics and Gynecology

## 2024-02-18 DIAGNOSIS — Z1231 Encounter for screening mammogram for malignant neoplasm of breast: Secondary | ICD-10-CM

## 2024-02-29 ENCOUNTER — Other Ambulatory Visit: Payer: Self-pay | Admitting: Family Medicine

## 2024-02-29 DIAGNOSIS — I10 Essential (primary) hypertension: Secondary | ICD-10-CM

## 2024-02-29 DIAGNOSIS — J301 Allergic rhinitis due to pollen: Secondary | ICD-10-CM

## 2024-03-10 ENCOUNTER — Ambulatory Visit (INDEPENDENT_AMBULATORY_CARE_PROVIDER_SITE_OTHER): Payer: Medicare Other | Admitting: Family Medicine

## 2024-03-10 ENCOUNTER — Encounter: Payer: Self-pay | Admitting: Family Medicine

## 2024-03-10 VITALS — BP 120/80 | HR 66 | Temp 98.1°F | Resp 16 | Ht 66.0 in | Wt 122.8 lb

## 2024-03-10 DIAGNOSIS — J0140 Acute pansinusitis, unspecified: Secondary | ICD-10-CM | POA: Diagnosis not present

## 2024-03-10 DIAGNOSIS — I10 Essential (primary) hypertension: Secondary | ICD-10-CM | POA: Diagnosis not present

## 2024-03-10 DIAGNOSIS — J301 Allergic rhinitis due to pollen: Secondary | ICD-10-CM | POA: Diagnosis not present

## 2024-03-10 MED ORDER — FLUTICASONE PROPIONATE 50 MCG/ACT NA SUSP: 2.0000 | Freq: Every day | NASAL | 6 refills | Status: DC

## 2024-03-10 MED ORDER — AZELASTINE HCL 0.1 % NA SOLN: 2.0000 | Freq: Two times a day (BID) | NASAL | 12 refills | Status: AC

## 2024-03-10 NOTE — Assessment & Plan Note (Signed)
 Stable as long as she uses nasal spray

## 2024-03-10 NOTE — Assessment & Plan Note (Signed)
 Well controlled, no changes to meds. Encouraged heart healthy diet such as the DASH diet and exercise as tolerated.

## 2024-03-10 NOTE — Progress Notes (Signed)
 Established Patient Office Visit  Subjective   Patient ID: Sherri Hoffman, female    DOB: [birth year]  Age: 68 y.o. MRN: [id number]  Chief Complaint  Patient presents with   Hypertension   Follow-up    HPI Discussed the use of AI scribe software for clinical note transcription with the patient, who gave verbal consent to proceed.  History of Present Illness Sherri Hoffman is a 68 year old female who presents for a routine follow-up visit.  She has been doing well overall with no significant issues to report. Her blood pressure is well-controlled, and she recently picked up a 90-day supply of her antihypertensive medication, which she takes regularly. She also has a sufficient supply of potassium supplements, preferring the orange round ones with coating.  She uses Flonase  nasal spray daily for allergies and finds it effective. She requests a refill for this medication.  She has a history of arthritis in her knees, which continues to cause discomfort. Previous meniscus surgery and cortisone injections have provided limited relief. She is considering further treatment options, such as gel injections, and has been consulting with a PA at [facility] for her knee issues.  She had her labs done six months ago, and they were normal.   Patient Active Problem List   Diagnosis Date Noted   Seasonal allergic rhinitis due to pollen 01/01/2023   Vitamin D  deficiency 01/01/2023   Need for pneumococcal vaccination 06/13/2021   Left ear impacted cerumen 12/22/2020   Primary hypertension 11/26/2017   Preventative health care 11/26/2017   Pansinusitis 01/20/2017   Osteopenia 07/19/2016   ALLERGIC RHINITIS CAUSE UNSPECIFIED 09/13/2010   NEVI, MULTIPLE 07/01/2009   Chronic cough 06/18/2008   A A A 04/08/2007   Essential hypertension 03/18/2007   SHOULDER PAIN, LEFT 03/18/2007   SKIN CANCER, HX OF 03/18/2007   CARCINOMA, BASAL CELL 03/14/2007   Past Medical History:  Diagnosis Date    Hypertension    Skin cancer, basal cell    Past Surgical History:  Procedure Laterality Date   KNEE ARTHROSCOPY Left 03/06/2022   [facility]   MYOMECTOMY     Social History   Tobacco Use   Smoking status: Never   Smokeless tobacco: Never  Substance Use Topics   Alcohol use: Yes    Alcohol/week: 3.0 standard drinks of alcohol    Types: 3 Glasses of wine per week   Drug use: No   Social History   Socioeconomic History   Marital status: Married    Spouse name: Not on file   Number of children: Not on file   Years of education: Not on file   Highest education level: Not on file  Occupational History   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  Tobacco Use   Smoking status: Never   Smokeless tobacco: Never  Substance and Sexual Activity   Alcohol use: Yes    Alcohol/week: 3.0 standard drinks of alcohol    Types: 3 Glasses of wine per week   Drug use: No   Sexual activity: Yes    Partners: Male    Birth control/protection: Post-menopausal  Other Topics Concern   Not on file  Social History Narrative   Not on file   Social Drivers of Health   Financial Resource Strain: Low Risk  (11/21/2022)   Overall Financial Resource Strain (CARDIA)    Difficulty of Paying Living Expenses: Not hard at all  Food Insecurity: No Food Insecurity (11/21/2022)   Hunger Vital  Sign    Worried About Programme researcher, broadcasting/film/video in the Last Year: Never true    Ran Out of Food in the Last Year: Never true  Transportation Needs: No Transportation Needs (11/21/2022)   PRAPARE - Administrator, Civil Service (Medical): No    Lack of Transportation (Non-Medical): No  Physical Activity: Sufficiently Active (11/21/2022)   Exercise Vital Sign    Days of Exercise per Week: 5 days    Minutes of Exercise per Session: 90 min  Stress: No Stress Concern Present (11/21/2022)   [employer] of Occupational Health - Occupational Stress Questionnaire    Feeling of Stress : Not at all  Social  Connections: Moderately Integrated (11/21/2022)   Social Connection and Isolation Panel [NHANES]    Frequency of Communication with Friends and Family: More than three times a week    Frequency of Social Gatherings with Friends and Family: More than three times a week    Attends Religious Services: 1 to 4 times per year    Active Member of [employer] or Organizations: No    Attends Banker Meetings: Never    Marital Status: Married  Catering manager Violence: Not At Risk (11/21/2022)   Humiliation, Afraid, Rape, and Kick questionnaire    Fear of Current or Ex-Partner: No    Emotionally Abused: No    Physically Abused: No    Sexually Abused: No   Family Status  Relation Name Status   Mother  Deceased at age 27       pneumonia, copd,  chf   Father  Deceased at age 7       chf   Sister  Alive       severe anemia-- etiology unknown   Other  (Not Specified)   Brother  (Not Specified)  No partnership data on file   Family History  Problem Relation Age of Onset   Heart disease Mother    Diabetes Mother    Hyperlipidemia Mother    COPD Mother    Cancer Father        melanoma   Heart disease Father        chf   COPD Father    Skin cancer Other        Melanoma   Coronary artery disease Other    COPD Other    Cancer Other        skin, melanoma   Depression Brother    Allergies  Allergen Reactions   Ace Inhibitors     REACTION: COUGH   Erythromycin     REACTION: NAUSEA   Hydrocodone  Bit-Homatrop Mbr     Nausea/vomitting   Tussionex Pennkinetic Er [Hydrocod Poli-Chlorphe Poli Er]     nausea      Review of Systems  Constitutional:  Negative for fever and malaise/fatigue.  HENT:  Negative for congestion.   Eyes:  Negative for blurred vision.  Respiratory:  Negative for shortness of breath.   Cardiovascular:  Negative for chest pain, palpitations and leg swelling.  Gastrointestinal:  Negative for abdominal pain, blood in stool and nausea.  Genitourinary:   Negative for dysuria and frequency.  Musculoskeletal:  Negative for falls.  Skin:  Negative for rash.  Neurological:  Negative for dizziness, loss of consciousness and headaches.  Endo/Heme/Allergies:  Negative for environmental allergies.  Psychiatric/Behavioral:  Negative for depression. The patient is not nervous/anxious.       Objective:     BP 120/80 (BP Location: Left Arm,  Patient Position: Sitting, Cuff Size: Normal)   Pulse 66   Temp 98.1 F (36.7 C) (Oral)   Resp 16   Ht 5\' 6"  (1.676 m)   Wt 122 lb 12.8 oz (55.7 kg)   SpO2 98%   BMI 19.82 kg/m  BP Readings from Last 3 Encounters:  03/10/24 120/80  12/24/23 (!) 160/88  09/12/23 (!) 140/80   Wt Readings from Last 3 Encounters:  03/10/24 122 lb 12.8 oz (55.7 kg)  12/24/23 123 lb 3.2 oz (55.9 kg)  09/12/23 121 lb 9.6 oz (55.2 kg)   SpO2 Readings from Last 3 Encounters:  03/10/24 98%  12/24/23 98%  09/12/23 98%      Physical Exam Vitals and nursing note reviewed.  Constitutional:      General: She is not in acute distress.    Appearance: Normal appearance. She is well-developed.  HENT:     Head: Normocephalic and atraumatic.     Right Ear: Tympanic membrane, ear canal and external ear normal. There is no impacted cerumen.     Left Ear: Tympanic membrane, ear canal and external ear normal. There is no impacted cerumen.     Nose: Nose normal.     Mouth/Throat:     Mouth: Mucous membranes are moist.     Pharynx: Oropharynx is clear. No oropharyngeal exudate or posterior oropharyngeal erythema.  Eyes:     General: No scleral icterus.       Right eye: No discharge.        Left eye: No discharge.     Conjunctiva/sclera: Conjunctivae normal.     Pupils: Pupils are equal, round, and reactive to light.  Neck:     Thyroid : No thyromegaly or thyroid  tenderness.     Vascular: No JVD.  Cardiovascular:     Rate and Rhythm: Normal rate and regular rhythm.     Heart sounds: Normal heart sounds. No murmur  heard. Pulmonary:     Effort: Pulmonary effort is normal. No respiratory distress.     Breath sounds: Normal breath sounds.  Abdominal:     General: Bowel sounds are normal. There is no distension.     Palpations: Abdomen is soft. There is no mass.     Tenderness: There is no abdominal tenderness. There is no guarding or rebound.  Genitourinary:    Vagina: Normal.  Musculoskeletal:        General: Normal range of motion.     Cervical back: Normal range of motion and neck supple.     Right lower leg: No edema.     Left lower leg: No edema.  Lymphadenopathy:     Cervical: No cervical adenopathy.  Skin:    General: Skin is warm and dry.     Findings: No erythema or rash.  Neurological:     Mental Status: She is alert and oriented to person, place, and time.     Cranial Nerves: No cranial nerve deficit.     Deep Tendon Reflexes: Reflexes are normal and symmetric.  Psychiatric:        Mood and Affect: Mood normal.        Behavior: Behavior normal.        Thought Content: Thought content normal.        Judgment: Judgment normal.      No results found for any visits on 03/10/24.  Last CBC Lab Results  Component Value Date   WBC 4.6 09/12/2023   HGB 13.5 09/12/2023   HCT 41.2 09/12/2023  MCV 94.5 09/12/2023   RDW 13.7 09/12/2023   PLT 307.0 09/12/2023   Last metabolic panel Lab Results  Component Value Date   GLUCOSE 96 09/12/2023   NA 139 09/12/2023   K 4.3 09/12/2023   CL 101 09/12/2023   CO2 29 09/12/2023   BUN 18 09/12/2023   CREATININE 0.55 09/12/2023   GFR 94.03 09/12/2023   CALCIUM  9.7 09/12/2023   PROT 7.3 09/12/2023   ALBUMIN 4.5 09/12/2023   BILITOT 0.6 09/12/2023   ALKPHOS 41 09/12/2023   AST 18 09/12/2023   ALT 16 09/12/2023   Last lipids Lab Results  Component Value Date   CHOL 203 (H) 09/12/2023   HDL 87.90 09/12/2023   LDLCALC 86 09/12/2023   TRIG 145.0 09/12/2023   CHOLHDL 2 09/12/2023   Last hemoglobin A1c Lab Results  Component  Value Date   HGBA1C 5.5 01/11/2014   Last thyroid  functions Lab Results  Component Value Date   TSH 2.22 09/12/2023   Last vitamin D  Lab Results  Component Value Date   VD25OH 49.61 09/12/2023   Last vitamin B12 and Folate No results found for: "VITAMINB12", "FOLATE"    The 10-year ASCVD risk score (Arnett DK, et al., 2019) is: 9%    Assessment & Plan:   Problem List Items Addressed This Visit       Unprioritized   Pansinusitis   Relevant Medications   azelastine  (ASTELIN ) 0.1 % nasal spray   fluticasone  (FLONASE ) 50 MCG/ACT nasal spray   Seasonal allergic rhinitis due to pollen   Stable as long as she uses nasal spray      Relevant Medications   fluticasone  (FLONASE ) 50 MCG/ACT nasal spray   Essential hypertension - Primary   Well controlled, no changes to meds. Encouraged heart healthy diet such as the DASH diet and exercise as tolerated.       Assessment and Plan Assessment & Plan Osteoarthritis of knee   Chronic osteoarthritis of the knee persists with pain despite previous meniscus surgery and cortisone injections. Discussed potential benefits of gel injections and protein-rich plasma therapy. Gel injections require three weekly shots and may take weeks to show improvement. She is considering returning to an orthopedic specialist for further evaluation and treatment options. Plan to consult with the orthopedic specialist regarding gel injections and potential protein-rich plasma therapy.  Hypertension   Hypertension is well-controlled with the current medication regimen. Continue current antihypertensive medication regimen.  Allergic rhinitis   Allergic rhinitis is effectively managed with daily use of Flonase . No new symptoms reported. Refill Flonase  prescription at [facility].  Wellness Visit   During the routine wellness visit, blood pressure is well-controlled. Medication refills were discussed, confirming that blood pressure medication and potassium  supplements are refilled for six months. Discussed the need for bone density screening and mammogram in June. Reviewed the need for shingles and tetanus vaccinations, which can be administered at the pharmacy without an appointment. Schedule bone density screening and mammogram for June. Receive shingles and tetanus vaccinations at the pharmacy. Continue current medications as prescribed.    No follow-ups on file.    Ivionna Verley R Lowne Chase, DO

## 2024-03-20 ENCOUNTER — Telehealth: Payer: Self-pay | Admitting: Family Medicine

## 2024-03-20 NOTE — Telephone Encounter (Signed)
 Copied from CRM [id number]. Topic: Medicare AWV >> Mar 20, 2024  3:03 PM Juliana Ocean wrote: Reason for CRM: LVM 03/20/2024 to schedule AWV. Please schedule Virtual or Telehealth visits ONLY  Rosalee Collins; Care Guide Ambulatory Clinical Support [place] [facility] Direct Dial: [phone]

## 2024-04-20 DIAGNOSIS — Z01419 Encounter for gynecological examination (general) (routine) without abnormal findings: Secondary | ICD-10-CM | POA: Diagnosis not present

## 2024-04-22 DIAGNOSIS — N958 Other specified menopausal and perimenopausal disorders: Secondary | ICD-10-CM | POA: Diagnosis not present

## 2024-04-22 DIAGNOSIS — Z1231 Encounter for screening mammogram for malignant neoplasm of breast: Secondary | ICD-10-CM | POA: Diagnosis not present

## 2024-04-22 LAB — HM MAMMOGRAPHY

## 2024-04-22 LAB — HM DEXA SCAN: HM Dexa Scan: NORMAL

## 2024-04-29 DIAGNOSIS — H00011 Hordeolum externum right upper eyelid: Secondary | ICD-10-CM | POA: Diagnosis not present

## 2024-05-01 ENCOUNTER — Telehealth: Payer: Self-pay | Admitting: Family Medicine

## 2024-05-01 NOTE — Telephone Encounter (Signed)
 Copied from CRM [id number]. Topic: Medicare AWV >> May 01, 2024  1:33 PM Juliana Ocean wrote: Reason for CRM: LVM 05/01/2024 to schedule AWV. Please schedule Virtual or Telehealth visits ONLY.   Rosalee Collins; Care Guide Ambulatory Clinical Support [place] [facility] Direct Dial: [phone]

## 2024-05-02 ENCOUNTER — Ambulatory Visit: Payer: Self-pay | Admitting: Family Medicine

## 2024-05-03 ENCOUNTER — Other Ambulatory Visit: Payer: Self-pay | Admitting: Family Medicine

## 2024-05-03 DIAGNOSIS — I10 Essential (primary) hypertension: Secondary | ICD-10-CM

## 2024-05-28 ENCOUNTER — Other Ambulatory Visit: Payer: Self-pay | Admitting: Family Medicine

## 2024-05-28 DIAGNOSIS — I10 Essential (primary) hypertension: Secondary | ICD-10-CM

## 2024-07-08 DIAGNOSIS — L814 Other melanin hyperpigmentation: Secondary | ICD-10-CM | POA: Diagnosis not present

## 2024-07-08 DIAGNOSIS — L821 Other seborrheic keratosis: Secondary | ICD-10-CM | POA: Diagnosis not present

## 2024-07-08 DIAGNOSIS — L538 Other specified erythematous conditions: Secondary | ICD-10-CM | POA: Diagnosis not present

## 2024-07-08 DIAGNOSIS — Z7189 Other specified counseling: Secondary | ICD-10-CM | POA: Diagnosis not present

## 2024-07-08 DIAGNOSIS — D225 Melanocytic nevi of trunk: Secondary | ICD-10-CM | POA: Diagnosis not present

## 2024-07-08 DIAGNOSIS — L820 Inflamed seborrheic keratosis: Secondary | ICD-10-CM | POA: Diagnosis not present

## 2024-08-05 ENCOUNTER — Encounter: Payer: Self-pay | Admitting: Family Medicine

## 2024-08-06 ENCOUNTER — Encounter: Payer: Self-pay | Admitting: Family Medicine

## 2024-08-06 ENCOUNTER — Ambulatory Visit (INDEPENDENT_AMBULATORY_CARE_PROVIDER_SITE_OTHER): Admitting: Family Medicine

## 2024-08-06 VITALS — BP 138/82 | HR 69 | Temp 97.9°F | Resp 16 | Ht 66.0 in | Wt 122.8 lb

## 2024-08-06 DIAGNOSIS — J0140 Acute pansinusitis, unspecified: Secondary | ICD-10-CM

## 2024-08-06 MED ORDER — AMOXICILLIN-POT CLAVULANATE 875-125 MG PO TABS: 1.0000 | ORAL_TABLET | Freq: Two times a day (BID) | ORAL | 0 refills | Status: DC

## 2024-08-06 NOTE — Progress Notes (Signed)
 Subjective:    Patient ID: Sherri Hoffman, female    DOB: 06-Jan-1956, 68 y.o.   MRN: [id number]  Chief Complaint  Patient presents with   Sinus Problem    X7-10 days, pt states being at the beach last week, pt states having ear pain, eye pain and pressure n watery, face pressure. No cough, clearing throat a lot     HPI Discussed the use of AI scribe software for clinical note transcription with the patient, who gave verbal consent to proceed.  History of Present Illness Sherri Hoffman is a 68 year old female who presents with symptoms of a sinus infection.  Her symptoms began approximately 7 to 10 days ago while she was at the beach. Initially, she experienced ear pain and itching, followed by pressure under her eyes and on top of her head. Her eyes, particularly one, started watering, especially in the mornings.  She has been using nasal sprays, including Flonase  and Astelin , which have been helpful, but they are not sufficiently addressing her ear symptoms, which she describes as aching and feeling like being 'underwater'.  She is currently taking Xyzal  daily and has been using Flonase  regularly since April, with six refills. She believes her Astelin  prescription might be out, as it is not automatically refilled like her other medications.  She is planning to travel to [place]  next week to visit her sister and niece and is concerned about her symptoms worsening during the trip. She mentions a previous experience of contracting COVID-19 after a flight two years ago and is considering wearing a mask during her upcoming flight.                     Patient is in today for sinus congestion.    Past Medical History:  Diagnosis Date   Hypertension    Skin cancer, basal cell     Past Surgical History:  Procedure Laterality Date   KNEE ARTHROSCOPY Left 03/06/2022   [place]   MYOMECTOMY      Family History  Problem  Relation Age of Onset   Heart disease Mother    Diabetes Mother    Hyperlipidemia Mother    COPD Mother    Cancer Father        melanoma   Heart disease Father        chf   COPD Father    Skin cancer Other        Melanoma   Coronary artery disease Other    COPD Other    Cancer Other        skin, melanoma   Depression Brother     Social History   Socioeconomic History   Marital status: Married    Spouse name: Not on file   Number of children: Not on file   Years of education: Not on file   Highest education level: Not on file  Occupational History   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  Tobacco Use   Smoking status: Never   Smokeless tobacco: Never  Substance and Sexual Activity   Alcohol use: Yes    Alcohol/week: 3.0 standard drinks of alcohol    Types: 3 Glasses of wine per week   Drug use: No   Sexual activity: Yes    Partners: Male    Birth control/protection: Post-menopausal  Other Topics Concern  Not on file  Social History Narrative   Not on file   Social Drivers of Health   Financial Resource Strain: Low Risk  (11/21/2022)   Overall Financial Resource Strain (CARDIA)    Difficulty of Paying Living Expenses: Not hard at all  Food Insecurity: No Food Insecurity (11/21/2022)   Hunger Vital Sign    Worried About Running Out of Food in the Last Year: Never true    Ran Out of Food in the Last Year: Never true  Transportation Needs: No Transportation Needs (11/21/2022)   PRAPARE - Administrator, Civil Service (Medical): No    Lack of Transportation (Non-Medical): No  Physical Activity: Sufficiently Active (11/21/2022)   Exercise Vital Sign    Days of Exercise per Week: 5 days    Minutes of Exercise per Session: 90 min  Stress: No Stress Concern Present (11/21/2022)   [employer] of Occupational Health - Occupational Stress Questionnaire    Feeling of Stress : Not at all  Social Connections: Moderately Integrated (11/21/2022)    Social Connection and Isolation Panel    Frequency of Communication with Friends and Family: More than three times a week    Frequency of Social Gatherings with Friends and Family: More than three times a week    Attends Religious Services: 1 to 4 times per year    Active Member of [employer] or Organizations: No    Attends Banker Meetings: Never    Marital Status: Married  Catering manager Violence: Not At Risk (11/21/2022)   Humiliation, Afraid, Rape, and Kick questionnaire    Fear of Current or Ex-Partner: No    Emotionally Abused: No    Physically Abused: No    Sexually Abused: No    Outpatient Medications Prior to Visit  Medication Sig Dispense Refill   albuterol  (VENTOLIN  HFA) 108 (90 Base) MCG/ACT inhaler Inhale 2 puffs into the lungs every 6 (six) hours as needed for wheezing or shortness of breath. 20.1 g 2   azelastine  (ASTELIN ) 0.1 % nasal spray Place 2 sprays into both nostrils 2 (two) times daily. Use in each nostril as directed 30 mL 12   Calcium  Carbonate-Vitamin D  500-125 MG-UNIT TABS Take by mouth.     cholecalciferol (VITAMIN D ) 1000 units tablet Take 2,000 Units by mouth daily.      estradiol  (ESTRACE ) 1 MG tablet Take 1 tablet (1 mg total) by mouth daily. 90 tablet 3   fluticasone  (FLONASE ) 50 MCG/ACT nasal spray Place 2 sprays into both nostrils daily. 16 g 6   levocetirizine (XYZAL ) 5 MG tablet TAKE 1 TABLET(5 MG) BY MOUTH DAILY AS NEEDED FOR ALLERGIES 90 tablet 1   potassium chloride  (KLOR-CON  M) 10 MEQ tablet Take 1 tablet (10 mEq total) by mouth daily. 90 tablet 1   progesterone  (PROMETRIUM ) 100 MG capsule Take 1 capsule (100 mg total) by mouth daily. 90 capsule 4   valsartan -hydrochlorothiazide  (DIOVAN -HCT) 320-25 MG tablet TAKE 1 TABLET BY MOUTH DAILY 90 tablet 1   No facility-administered medications prior to visit.    Allergies  Allergen Reactions   Ace Inhibitors     REACTION: COUGH   Erythromycin     REACTION: NAUSEA   Hydrocodone   Bit-Homatrop Mbr     Nausea/vomitting   Tussionex Pennkinetic Er [Hydrocod Poli-Chlorphe Poli Er]     nausea    Review of Systems  Constitutional:  Negative for fever and malaise/fatigue.  HENT:  Positive for congestion and sinus pain.   Eyes:  Negative for blurred vision.  Respiratory:  Negative for cough and shortness of breath.   Cardiovascular:  Negative for chest pain, palpitations and leg swelling.  Gastrointestinal:  Negative for vomiting.  Musculoskeletal:  Negative for back pain.  Skin:  Negative for rash.  Neurological:  Negative for loss of consciousness and headaches.       Objective:    Physical Exam Vitals and nursing note reviewed.  Constitutional:      General: She is not in acute distress.    Appearance: Normal appearance. She is well-developed.  HENT:     Head: Normocephalic and atraumatic.     Nose: Congestion present.     Right Sinus: Maxillary sinus tenderness and frontal sinus tenderness present.     Left Sinus: Maxillary sinus tenderness and frontal sinus tenderness present.  Eyes:     General: No scleral icterus.       Right eye: No discharge.        Left eye: No discharge.  Cardiovascular:     Rate and Rhythm: Normal rate and regular rhythm.     Heart sounds: No murmur heard. Pulmonary:     Effort: Pulmonary effort is normal. No respiratory distress.     Breath sounds: Normal breath sounds.  Musculoskeletal:        General: Normal range of motion.     Cervical back: Normal range of motion and neck supple.     Right lower leg: No edema.     Left lower leg: No edema.  Skin:    General: Skin is warm and dry.  Neurological:     Mental Status: She is alert and oriented to person, place, and time.  Psychiatric:        Mood and Affect: Mood normal.        Behavior: Behavior normal.        Thought Content: Thought content normal.        Judgment: Judgment normal.     BP 138/82 (BP Location: Left Arm, Patient Position: Sitting, Cuff Size:  Normal)   Pulse 69   Temp 97.9 F (36.6 C) (Oral)   Resp 16   Ht 5' 6 (1.676 m)   Wt 122 lb 12.8 oz (55.7 kg)   SpO2 99%   BMI 19.82 kg/m  Wt Readings from Last 3 Encounters:  08/06/24 122 lb 12.8 oz (55.7 kg)  03/10/24 122 lb 12.8 oz (55.7 kg)  12/24/23 123 lb 3.2 oz (55.9 kg)    Diabetic Foot Exam - Simple   No data filed    Lab Results  Component Value Date   WBC 4.6 09/12/2023   HGB 13.5 09/12/2023   HCT 41.2 09/12/2023   PLT 307.0 09/12/2023   GLUCOSE 96 09/12/2023   CHOL 203 (H) 09/12/2023   TRIG 145.0 09/12/2023   HDL 87.90 09/12/2023   LDLCALC 86 09/12/2023   ALT 16 09/12/2023   AST 18 09/12/2023   NA 139 09/12/2023   K 4.3 09/12/2023   CL 101 09/12/2023   CREATININE 0.55 09/12/2023   BUN 18 09/12/2023   CO2 29 09/12/2023   TSH 2.22 09/12/2023   HGBA1C 5.5 01/11/2014    Lab Results  Component Value Date   TSH 2.22 09/12/2023   Lab Results  Component Value Date   WBC 4.6 09/12/2023   HGB 13.5 09/12/2023   HCT 41.2 09/12/2023   MCV 94.5 09/12/2023   PLT 307.0 09/12/2023   Lab Results  Component Value Date  NA 139 09/12/2023   K 4.3 09/12/2023   CO2 29 09/12/2023   GLUCOSE 96 09/12/2023   BUN 18 09/12/2023   CREATININE 0.55 09/12/2023   BILITOT 0.6 09/12/2023   ALKPHOS 41 09/12/2023   AST 18 09/12/2023   ALT 16 09/12/2023   PROT 7.3 09/12/2023   ALBUMIN 4.5 09/12/2023   CALCIUM  9.7 09/12/2023   GFR 94.03 09/12/2023   Lab Results  Component Value Date   CHOL 203 (H) 09/12/2023   Lab Results  Component Value Date   HDL 87.90 09/12/2023   Lab Results  Component Value Date   LDLCALC 86 09/12/2023   Lab Results  Component Value Date   TRIG 145.0 09/12/2023   Lab Results  Component Value Date   CHOLHDL 2 09/12/2023   Lab Results  Component Value Date   HGBA1C 5.5 01/11/2014       Assessment & Plan:  Acute non-recurrent pansinusitis -     Amoxicillin -Pot Clavulanate; Take 1 tablet by mouth 2 (two) times daily.   Dispense: 20 tablet; Refill: 0     Assessment and Plan Assessment & Plan Acute sinusitis   Symptoms of acute sinusitis have persisted for 7-10 days, including ear pain, itching, pressure under the eyes, and watery eyes, following a trip to the beach. Current nasal sprays, Flonase  and Astelin , are not fully alleviating symptoms, particularly ear discomfort. Prescribe Augmentin  for treatment. Ensure she has an adequate supply of Flonase  and Astelin . Advise wearing a mask during travel to reduce COVID-19 exposure risk.  Allergic rhinitis   Chronic allergic rhinitis is managed with Flonase , Astelin  nasal sprays, and Xyzal . She reports regular use of these medications, which generally help manage her symptoms. Continue Flonase , Astelin , and Xyzal  daily.   Thanvi Blincoe R Lowne Chase, DO

## 2024-08-11 DIAGNOSIS — L820 Inflamed seborrheic keratosis: Secondary | ICD-10-CM | POA: Diagnosis not present

## 2024-08-11 DIAGNOSIS — L538 Other specified erythematous conditions: Secondary | ICD-10-CM | POA: Diagnosis not present

## 2024-08-26 ENCOUNTER — Other Ambulatory Visit: Payer: Self-pay | Admitting: Family Medicine

## 2024-08-26 DIAGNOSIS — I10 Essential (primary) hypertension: Secondary | ICD-10-CM

## 2024-08-26 DIAGNOSIS — J301 Allergic rhinitis due to pollen: Secondary | ICD-10-CM

## 2024-09-09 ENCOUNTER — Ambulatory Visit (INDEPENDENT_AMBULATORY_CARE_PROVIDER_SITE_OTHER): Admitting: *Deleted

## 2024-09-09 VITALS — Ht 66.0 in | Wt 122.0 lb

## 2024-09-09 DIAGNOSIS — Z0000 Encounter for general adult medical examination without abnormal findings: Secondary | ICD-10-CM | POA: Diagnosis not present

## 2024-09-09 NOTE — Progress Notes (Signed)
 Please attest this visit in the absence of patient primary care provider.    Subjective:   Sherri Hoffman is a 68 y.o. who presents for a Medicare Wellness preventive visit.  As a reminder, Annual Wellness Visits don't include a physical exam, and some assessments may be limited, especially if this visit is performed virtually. We may recommend an in-person follow-up visit with your provider if needed.  Visit Complete: Virtual I connected with  Sherri Hoffman on 09/09/24 by a audio enabled telemedicine application and verified that I am speaking with the correct person using two identifiers.  Patient Location: Home  Provider Location: Office/Clinic  I discussed the limitations of evaluation and management by telemedicine. The patient expressed understanding and agreed to proceed.  Vital Signs: Because this visit was a virtual/telehealth visit, some criteria may be missing or patient reported. Any vitals not documented were not able to be obtained and vitals that have been documented are patient reported.  VideoDeclined- This patient declined Librarian, academic. Therefore the visit was completed with audio only.  Persons Participating in Visit: Patient.  AWV Questionnaire: No: Patient Medicare AWV questionnaire was not completed prior to this visit.  Cardiac Risk Factors include: advanced age (>78men, >41 women);hypertension;Other (see comment), Risk factor comments: AAA     Objective:    Today's Vitals   09/09/24 1504  Weight: 122 lb (55.3 kg)  Height: 5' 6 (1.676 m)   Body mass index is 19.69 kg/m.     09/09/2024    3:14 PM 11/21/2022   12:54 PM  Advanced Directives  Does Patient Have a Medical Advance Directive? Yes Yes  Type of Estate Agent of [place];Living will Healthcare Power of [place];Living will  Does patient want to make changes to medical advance directive? No - Patient declined   Copy of Healthcare  Power of Attorney in Chart? No - copy requested No - copy requested    Current Medications (verified) Outpatient Encounter Medications as of 09/09/2024  Medication Sig   albuterol  (VENTOLIN  HFA) 108 (90 Base) MCG/ACT inhaler Inhale 2 puffs into the lungs every 6 (six) hours as needed for wheezing or shortness of breath.   azelastine  (ASTELIN ) 0.1 % nasal spray Place 2 sprays into both nostrils 2 (two) times daily. Use in each nostril as directed   Calcium  Carbonate-Vitamin D  500-125 MG-UNIT TABS Take by mouth.   cholecalciferol (VITAMIN D ) 1000 units tablet Take 2,000 Units by mouth daily.    estradiol  (ESTRACE ) 1 MG tablet Take 1 tablet (1 mg total) by mouth daily.   Flaxseed, Linseed, (FLAX SEED OIL PO) Take 1 capsule by mouth in the morning and at bedtime.   fluticasone  (FLONASE ) 50 MCG/ACT nasal spray Place 2 sprays into both nostrils daily.   levocetirizine (XYZAL ) 5 MG tablet TAKE 1 TABLET(5 MG) BY MOUTH DAILY AS NEEDED FOR ALLERGIES   potassium chloride  (KLOR-CON  M) 10 MEQ tablet Take 1 tablet (10 mEq total) by mouth daily.   progesterone  (PROMETRIUM ) 100 MG capsule Take 1 capsule (100 mg total) by mouth daily.   valsartan -hydrochlorothiazide  (DIOVAN -HCT) 320-25 MG tablet TAKE 1 TABLET BY MOUTH DAILY   [DISCONTINUED] amoxicillin -clavulanate (AUGMENTIN ) 875-125 MG tablet Take 1 tablet by mouth 2 (two) times daily.   [DISCONTINUED] triamcinolone  (NASACORT  AQ) 55 MCG/ACT nasal inhaler Place 2 sprays into the nose daily.   No facility-administered encounter medications on file as of 09/09/2024.    Allergies (verified) Ace inhibitors, Erythromycin, Hydrocodone  bit-homatrop mbr, and Tussionex pennkinetic  er ceclia poli-chlorphe poli er]   History: Past Medical History:  Diagnosis Date   Hypertension    Skin cancer, basal cell    Past Surgical History:  Procedure Laterality Date   KNEE ARTHROSCOPY Left 03/06/2022   [place]   MYOMECTOMY     Family History  Problem Relation Age  of Onset   Heart disease Mother    Diabetes Mother    Hyperlipidemia Mother    COPD Mother    Cancer Father        melanoma   Heart disease Father        chf   COPD Father    Cancer Brother        leukemia   Depression Brother    Skin cancer Other        Melanoma   Coronary artery disease Other    COPD Other    Cancer Other        skin, melanoma   Social History   Socioeconomic History   Marital status: Married    Spouse name: Not on file   Number of children: Not on file   Years of education: Not on file   Highest education level: Not on file  Occupational History   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  Tobacco Use   Smoking status: Never   Smokeless tobacco: Never  Substance and Sexual Activity   Alcohol use: Yes    Alcohol/week: 3.0 standard drinks of alcohol    Types: 3 Glasses of wine per week   Drug use: No   Sexual activity: Yes    Partners: Male    Birth control/protection: Post-menopausal  Other Topics Concern   Not on file  Social History Narrative   Not on file   Social Drivers of Health   Financial Resource Strain: Low Risk  (11/21/2022)   Overall Financial Resource Strain (CARDIA)    Difficulty of Paying Living Expenses: Not hard at all  Food Insecurity: No Food Insecurity (09/09/2024)   Hunger Vital Sign    Worried About Running Out of Food in the Last Year: Never true    Ran Out of Food in the Last Year: Never true  Transportation Needs: No Transportation Needs (09/09/2024)   PRAPARE - Administrator, Civil Service (Medical): No    Lack of Transportation (Non-Medical): No  Physical Activity: Sufficiently Active (09/09/2024)   Exercise Vital Sign    Days of Exercise per Week: 5 days    Minutes of Exercise per Session: 90 min  Stress: No Stress Concern Present (11/21/2022)   [employer] of Occupational Health - Occupational Stress Questionnaire    Feeling of Stress : Not at all  Social Connections: Socially  Integrated (09/09/2024)   Social Connection and Isolation Panel    Frequency of Communication with Friends and Family: More than three times a week    Frequency of Social Gatherings with Friends and Family: More than three times a week    Attends Religious Services: More than 4 times per year    Active Member of [employer] or Organizations: Yes    Attends Engineer, Structural: More than 4 times per year    Marital Status: Married    Tobacco Counseling Counseling given: Not Answered    Clinical Intake:  Pre-visit preparation completed: Yes  Pain : No/denies pain     BMI - recorded: 19.69 Nutritional Risks: Other (Comment) Diabetes: No  Lab Results  Component Value Date  HGBA1C 5.5 01/11/2014     How often do you need to have someone help you when you read instructions, pamphlets, or other written materials from your doctor or pharmacy?: 1 - Never  Interpreter Needed?: No  Information entered by :: Weronika Birch, CMA (AAMA)   Activities of Daily Living     09/09/2024    3:09 PM  In your present state of health, do you have any difficulty performing the following activities:  Hearing? 0  Vision? 0  Difficulty concentrating or making decisions? 0  Walking or climbing stairs? 0  Dressing or bathing? 0  Doing errands, shopping? 0  Preparing Food and eating ? N  Using the Toilet? N  In the past six months, have you accidently leaked urine? N  Do you have problems with loss of bowel control? N  Managing your Medications? N  Managing your Finances? N  Housekeeping or managing your Housekeeping? N    Patient Care Team: Antonio Meth, Jamee SAUNDERS, DO as PCP - General Darcel Pool, MD as Consulting Physician (Obstetrics and Gynecology) [facility], [facility]   I have updated your Care Teams any recent Medical Services you may have received from other providers in the past year.     Assessment:   This is a routine  wellness examination for Sherri Hoffman.  Hearing/Vision screen Hearing Screening - Comments:: Denies hearing difficulties.  Vision Screening - Comments:: Up to date with routine eye exams with [facility]   Goals Addressed   None    Depression Screen     09/09/2024    3:12 PM 09/12/2023    9:36 AM 11/21/2022   12:53 PM 09/19/2021    5:46 PM 06/13/2021    9:09 AM 06/09/2020   11:13 AM 06/09/2020    8:06 AM  PHQ 2/9 Scores  PHQ - 2 Score 0 0 0 0 0 0 0  PHQ- 9 Score 0     0     Fall Risk     09/09/2024    3:14 PM 09/12/2023    9:35 AM 11/21/2022   12:55 PM 09/19/2021    5:46 PM 06/13/2021    9:09 AM  Fall Risk   Falls in the past year? 0 0 0 0 0  Number falls in past yr: 0 0 0 0 0  Injury with Fall? 0 0 0 0 0  Risk for fall due to : No Fall Risks  Impaired vision No Fall Risks   Follow up Education provided Falls evaluation completed Falls prevention discussed  Falls evaluation completed  Falls evaluation completed      Data saved with a previous flowsheet row definition    MEDICARE RISK AT HOME:  Medicare Risk at Home Any stairs in or around the home?: Yes If so, are there any without handrails?: No Home free of loose throw rugs in walkways, pet beds, electrical cords, etc?: Yes Adequate lighting in your home to reduce risk of falls?: Yes Life alert?: Yes Use of a cane, walker or w/c?: No Grab bars in the bathroom?: No Shower chair or bench in shower?: Yes (doesn't need it) Elevated toilet seat or a handicapped toilet?: No (comfort height)  TIMED UP AND GO:  Was the test performed?  No, audio  Cognitive Function: 6CIT completed    06/09/2020   11:13 AM  MMSE - Mini Mental State Exam  Orientation to time 5  Orientation to Place 5  Registration 3  Attention/ Calculation 5  Recall 3  Language- name 2 objects 2  Language- repeat 1  Language- follow 3 step command 3  Language- read & follow direction 1  Write a sentence 1  Copy design 1  Total score 30         11/21/2022   12:56 PM  6CIT Screen  What Year? 0 points  What month? 0 points  What time? 0 points  Count back from 20 0 points  Months in reverse 0 points  Repeat phrase 0 points  Total Score 0 points    Immunizations Immunization History  Administered Date(s) Administered   Fluad Trivalent(High Dose 65+) 09/12/2023   Influenza Whole 08/09/2010, 08/30/2011, 08/26/2012   Influenza,inj,Quad PF,6+ Mos 08/12/2019   Influenza-Unspecified 08/12/2013, 08/12/2020   Moderna Sars-Covid-2 Vaccination 12/17/2019, 01/14/2020   PNEUMOCOCCAL CONJUGATE-20 06/13/2021   Td 03/18/2007   Zoster, Live 04/18/2015    Screening Tests Health Maintenance  Topic Date Due   Zoster Vaccines- Shingrix (1 of 2) 01/13/1974   DTaP/Tdap/Td (2 - Tdap) 03/17/2017   Influenza Vaccine  06/12/2024   COVID-19 Vaccine (3 - Moderna risk series) 09/09/2025 (Originally 02/11/2020)   Medicare Annual Wellness (AWV)  09/09/2025   Mammogram  04/22/2026   DEXA SCAN  04/22/2026   Colonoscopy  08/11/2028   Pneumococcal Vaccine: 50+ Years  Completed   Hepatitis C Screening  Completed   Meningococcal B Vaccine  Aged Out    Health Maintenance Items Addressed: Would like flu vaccine at upcoming office visit. Will consider tetanus and shingles vaccines at pharmacy.  Additional Screening:  Vision Screening: Recommended annual ophthalmology exams for early detection of glaucoma and other disorders of the eye. Is the patient up to date with their annual eye exam?  Yes  Who is the provider or what is the name of the office in which the patient attends annual eye exams? [facility]  Dental Screening: Recommended annual dental exams for proper oral hygiene  Community Resource Referral / Chronic Care Management: CRR required this visit?  No   CCM required this visit?  No   Plan:    I have personally reviewed and noted the following in the patient's chart:   Medical and social history Use of alcohol,  tobacco or illicit drugs  Current medications and supplements including opioid prescriptions. Patient is not currently taking opioid prescriptions. Functional ability and status Nutritional status Physical activity Advanced directives List of other physicians Hospitalizations, surgeries, and ER visits in previous 12 months Vitals Screenings to include cognitive, depression, and falls Referrals and appointments  In addition, I have reviewed and discussed with patient certain preventive protocols, quality metrics, and best practice recommendations. A written personalized care plan for preventive services as well as general preventive health recommendations were provided to patient.   Lolita Libra, CMA   09/09/2024   After Visit Summary: (MyChart) Due to this being a telephonic visit, the after visit summary with patients personalized plan was offered to patient via MyChart   Notes: Nothing significant to report at this time.

## 2024-09-09 NOTE — Patient Instructions (Addendum)
 Sherri Hoffman , Thank you for taking time out of your busy schedule to complete your Annual Wellness Visit with me. I enjoyed our conversation and look forward to speaking with you again next year. I, as well as your care team,  appreciate your ongoing commitment to your health goals. Please review the following plan we discussed and let me know if I can assist you in the future. Your Game plan/ To Do List    Follow up Visits: Next Medicare AWV with our clinical staff: 09/16/25  3pm, telephone  Next Office Visit with your provider: 09/24/24 9am, Antonio Meth (can get flu vaccine if not already received)  Clinician Recommendations:  Aim for 30 minutes of exercise or brisk walking, 6-8 glasses of water, and 5 servings of fruits and vegetables each day.   You will need to get the following vaccines at your local pharmacy: Tetanus, Shingles      This is a list of the screening recommended for you and due dates:  Health Maintenance  Topic Date Due   Zoster (Shingles) Vaccine (1 of 2) 01/13/1974   DTaP/Tdap/Td vaccine (2 - Tdap) 03/17/2017   COVID-19 Vaccine (3 - Moderna risk series) 02/11/2020   Medicare Annual Wellness Visit  11/22/2023   Flu Shot  06/12/2024   Breast Cancer Screening  04/22/2026   DEXA scan (bone density measurement)  04/22/2026   Colon Cancer Screening  08/11/2028   Pneumococcal Vaccine for age over 76  Completed   Hepatitis C Screening  Completed   Meningitis B Vaccine  Aged Out    Advanced directives: (Copy Requested) Please bring a copy of your health care power of attorney and living will to the office to be added to your chart at your convenience. You can mail to [facility] [address] [place], [place] [address] or email to [email or web address] Advance Care Planning is important because it:  [x]  Makes sure you receive the medical care that is consistent with your values, goals, and preferences  [x]  It provides guidance to your family and  loved ones and reduces their decisional burden about whether or not they are making the right decisions based on your wishes.  Follow the link provided in your after visit summary or read over the paperwork we have mailed to you to help you started getting your Advance Directives in place. If you need assistance in completing these, please reach out to us  so that we can help you!  See attachments for Preventive Care and Fall Prevention Tips.

## 2024-09-10 NOTE — Progress Notes (Signed)
 Please attest this visit in the absence of patient primary care provider.    Subjective:   Sherri Hoffman is a 68 y.o. who presents for a Medicare Wellness preventive visit.  As a reminder, Annual Wellness Visits don't include a physical exam, and some assessments may be limited, especially if this visit is performed virtually. We may recommend an in-person follow-up visit with your provider if needed.  Visit Complete: Virtual I connected with  Sherri Hoffman on 09/09/24 by a audio enabled telemedicine application and verified that I am speaking with the correct person using two identifiers.  Patient Location: Home  Provider Location: Office/Clinic  I discussed the limitations of evaluation and management by telemedicine. The patient expressed understanding and agreed to proceed.  Vital Signs: Because this visit was a virtual/telehealth visit, some criteria may be missing or patient reported. Any vitals not documented were not able to be obtained and vitals that have been documented are patient reported.  VideoDeclined- This patient declined Librarian, academic. Therefore the visit was completed with audio only.  Persons Participating in Visit: Patient.  AWV Questionnaire: No: Patient Medicare AWV questionnaire was not completed prior to this visit.  Cardiac Risk Factors include: advanced age (>59men, >26 women);hypertension;Other (see comment), Risk factor comments: AAA     Objective:    Today's Vitals   09/09/24 1504  Weight: 122 lb (55.3 kg)  Height: 5' 6 (1.676 m)   Body mass index is 19.69 kg/m.     09/09/2024    3:14 PM 11/21/2022   12:54 PM  Advanced Directives  Does Patient Have a Medical Advance Directive? Yes Yes  Type of Estate Agent of [place];Living will Healthcare Power of [place];Living will  Does patient want to make changes to medical advance directive? No - Patient declined   Copy of Healthcare  Power of Attorney in Chart? No - copy requested No - copy requested    Current Medications (verified) Outpatient Encounter Medications as of 09/09/2024  Medication Sig   albuterol  (VENTOLIN  HFA) 108 (90 Base) MCG/ACT inhaler Inhale 2 puffs into the lungs every 6 (six) hours as needed for wheezing or shortness of breath.   azelastine  (ASTELIN ) 0.1 % nasal spray Place 2 sprays into both nostrils 2 (two) times daily. Use in each nostril as directed   Calcium  Carbonate-Vitamin D  500-125 MG-UNIT TABS Take by mouth.   cholecalciferol (VITAMIN D ) 1000 units tablet Take 2,000 Units by mouth daily.    estradiol  (ESTRACE ) 1 MG tablet Take 1 tablet (1 mg total) by mouth daily.   Flaxseed, Linseed, (FLAX SEED OIL PO) Take 1 capsule by mouth in the morning and at bedtime.   fluticasone  (FLONASE ) 50 MCG/ACT nasal spray Place 2 sprays into both nostrils daily.   levocetirizine (XYZAL ) 5 MG tablet TAKE 1 TABLET(5 MG) BY MOUTH DAILY AS NEEDED FOR ALLERGIES   potassium chloride  (KLOR-CON  M) 10 MEQ tablet Take 1 tablet (10 mEq total) by mouth daily.   progesterone  (PROMETRIUM ) 100 MG capsule Take 1 capsule (100 mg total) by mouth daily.   valsartan -hydrochlorothiazide  (DIOVAN -HCT) 320-25 MG tablet TAKE 1 TABLET BY MOUTH DAILY   [DISCONTINUED] amoxicillin -clavulanate (AUGMENTIN ) 875-125 MG tablet Take 1 tablet by mouth 2 (two) times daily.   [DISCONTINUED] triamcinolone  (NASACORT  AQ) 55 MCG/ACT nasal inhaler Place 2 sprays into the nose daily.   No facility-administered encounter medications on file as of 09/09/2024.    Allergies (verified) Ace inhibitors, Erythromycin, Hydrocodone  bit-homatrop mbr, and Tussionex pennkinetic  er ceclia poli-chlorphe poli er]   History: Past Medical History:  Diagnosis Date   Hypertension    Skin cancer, basal cell    Past Surgical History:  Procedure Laterality Date   KNEE ARTHROSCOPY Left 03/06/2022   [place]   MYOMECTOMY     Family History  Problem Relation Age  of Onset   Heart disease Mother    Diabetes Mother    Hyperlipidemia Mother    COPD Mother    Cancer Father        melanoma   Heart disease Father        chf   COPD Father    Cancer Brother        leukemia   Depression Brother    Skin cancer Other        Melanoma   Coronary artery disease Other    COPD Other    Cancer Other        skin, melanoma   Social History   Socioeconomic History   Marital status: Married    Spouse name: Not on file   Number of children: Not on file   Years of education: Not on file   Highest education level: Not on file  Occupational History   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  Tobacco Use   Smoking status: Never   Smokeless tobacco: Never  Substance and Sexual Activity   Alcohol use: Yes    Alcohol/week: 3.0 standard drinks of alcohol    Types: 3 Glasses of wine per week   Drug use: No   Sexual activity: Yes    Partners: Male    Birth control/protection: Post-menopausal  Other Topics Concern   Not on file  Social History Narrative   Not on file   Social Drivers of Health   Financial Resource Strain: Low Risk  (11/21/2022)   Overall Financial Resource Strain (CARDIA)    Difficulty of Paying Living Expenses: Not hard at all  Food Insecurity: No Food Insecurity (09/09/2024)   Hunger Vital Sign    Worried About Running Out of Food in the Last Year: Never true    Ran Out of Food in the Last Year: Never true  Transportation Needs: No Transportation Needs (09/09/2024)   PRAPARE - Administrator, Civil Service (Medical): No    Lack of Transportation (Non-Medical): No  Physical Activity: Sufficiently Active (09/09/2024)   Exercise Vital Sign    Days of Exercise per Week: 5 days    Minutes of Exercise per Session: 90 min  Stress: No Stress Concern Present (11/21/2022)   [employer] of Occupational Health - Occupational Stress Questionnaire    Feeling of Stress : Not at all  Social Connections: Socially  Integrated (09/09/2024)   Social Connection and Isolation Panel    Frequency of Communication with Friends and Family: More than three times a week    Frequency of Social Gatherings with Friends and Family: More than three times a week    Attends Religious Services: More than 4 times per year    Active Member of [employer] or Organizations: Yes    Attends Engineer, Structural: More than 4 times per year    Marital Status: Married    Tobacco Counseling Counseling given: Not Answered    Clinical Intake:  Pre-visit preparation completed: Yes  Pain : No/denies pain     BMI - recorded: 19.69 Nutritional Risks: Other (Comment) Diabetes: No  Lab Results  Component Value Date  HGBA1C 5.5 01/11/2014     How often do you need to have someone help you when you read instructions, pamphlets, or other written materials from your doctor or pharmacy?: 1 - Never  Interpreter Needed?: No  Information entered by :: Kemo Spruce, CMA (AAMA)   Activities of Daily Living     09/09/2024    3:09 PM  In your present state of health, do you have any difficulty performing the following activities:  Hearing? 0  Vision? 0  Difficulty concentrating or making decisions? 0  Walking or climbing stairs? 0  Dressing or bathing? 0  Doing errands, shopping? 0  Preparing Food and eating ? N  Using the Toilet? N  In the past six months, have you accidently leaked urine? N  Do you have problems with loss of bowel control? N  Managing your Medications? N  Managing your Finances? N  Housekeeping or managing your Housekeeping? N    Patient Care Team: Antonio Meth, Jamee SAUNDERS, DO as PCP - General Darcel Pool, MD as Consulting Physician (Obstetrics and Gynecology) [facility], [facility]   I have updated your Care Teams any recent Medical Services you may have received from other providers in the past year.     Assessment:   This is a routine  wellness examination for Sherri Hoffman.  Hearing/Vision screen Hearing Screening - Comments:: Denies hearing difficulties.  Vision Screening - Comments:: Up to date with routine eye exams with [facility]   Goals Addressed   None    Depression Screen     09/09/2024    3:12 PM 09/12/2023    9:36 AM 11/21/2022   12:53 PM 09/19/2021    5:46 PM 06/13/2021    9:09 AM 06/09/2020   11:13 AM 06/09/2020    8:06 AM  PHQ 2/9 Scores  PHQ - 2 Score 0 0 0 0 0 0 0  PHQ- 9 Score 0     0     Fall Risk     09/09/2024    3:14 PM 09/12/2023    9:35 AM 11/21/2022   12:55 PM 09/19/2021    5:46 PM 06/13/2021    9:09 AM  Fall Risk   Falls in the past year? 0 0 0 0 0  Number falls in past yr: 0 0 0 0 0  Injury with Fall? 0 0 0 0 0  Risk for fall due to : No Fall Risks  Impaired vision No Fall Risks   Follow up Education provided Falls evaluation completed Falls prevention discussed  Falls evaluation completed  Falls evaluation completed      Data saved with a previous flowsheet row definition    MEDICARE RISK AT HOME:  Medicare Risk at Home Any stairs in or around the home?: Yes If so, are there any without handrails?: No Home free of loose throw rugs in walkways, pet beds, electrical cords, etc?: Yes Adequate lighting in your home to reduce risk of falls?: Yes Life alert?: Yes Use of a cane, walker or w/c?: No Grab bars in the bathroom?: No Shower chair or bench in shower?: Yes (doesn't need it) Elevated toilet seat or a handicapped toilet?: No (comfort height)  TIMED UP AND GO:  Was the test performed?  No, audio  Cognitive Function: 6CIT completed    06/09/2020   11:13 AM  MMSE - Mini Mental State Exam  Orientation to time 5  Orientation to Place 5  Registration 3  Attention/ Calculation 5  Recall 3  Language- name 2 objects 2  Language- repeat 1  Language- follow 3 step command 3  Language- read & follow direction 1  Write a sentence 1  Copy design 1  Total score 30         09/09/2024    3:15 PM 11/21/2022   12:56 PM  6CIT Screen  What Year? 0 points 0 points  What month? 0 points 0 points  What time? 0 points 0 points  Count back from 20 0 points 0 points  Months in reverse 0 points 0 points  Repeat phrase 0 points 0 points  Total Score 0 points 0 points    Immunizations Immunization History  Administered Date(s) Administered   Fluad Trivalent(High Dose 65+) 09/12/2023   Influenza Whole 08/09/2010, 08/30/2011, 08/26/2012   Influenza,inj,Quad PF,6+ Mos 08/12/2019   Influenza-Unspecified 08/12/2013, 08/12/2020   Moderna Sars-Covid-2 Vaccination 12/17/2019, 01/14/2020   PNEUMOCOCCAL CONJUGATE-20 06/13/2021   Td 03/18/2007   Zoster, Live 04/18/2015    Screening Tests Health Maintenance  Topic Date Due   Zoster Vaccines- Shingrix (1 of 2) 01/13/1974   DTaP/Tdap/Td (2 - Tdap) 03/17/2017   Influenza Vaccine  06/12/2024   COVID-19 Vaccine (3 - Moderna risk series) 09/09/2025 (Originally 02/11/2020)   Medicare Annual Wellness (AWV)  09/09/2025   Mammogram  04/22/2026   DEXA SCAN  04/22/2026   Colonoscopy  08/11/2028   Pneumococcal Vaccine: 50+ Years  Completed   Hepatitis C Screening  Completed   Meningococcal B Vaccine  Aged Out    Health Maintenance Items Addressed: Would like flu vaccine at upcoming office visit. Will consider tetanus and shingles vaccines at pharmacy.  Additional Screening:  Vision Screening: Recommended annual ophthalmology exams for early detection of glaucoma and other disorders of the eye. Is the patient up to date with their annual eye exam?  Yes  Who is the provider or what is the name of the office in which the patient attends annual eye exams? [facility]  Dental Screening: Recommended annual dental exams for proper oral hygiene  Community Resource Referral / Chronic Care Management: CRR required this visit?  No   CCM required this visit?  No   Plan:    I have personally reviewed and noted the  following in the patient's chart:   Medical and social history Use of alcohol, tobacco or illicit drugs  Current medications and supplements including opioid prescriptions. Patient is not currently taking opioid prescriptions. Functional ability and status Nutritional status Physical activity Advanced directives List of other physicians Hospitalizations, surgeries, and ER visits in previous 12 months Vitals Screenings to include cognitive, depression, and falls Referrals and appointments  In addition, I have reviewed and discussed with patient certain preventive protocols, quality metrics, and best practice recommendations. A written personalized care plan for preventive services as well as general preventive health recommendations were provided to patient.   Lolita Libra, CMA   09/10/2024   After Visit Summary: (MyChart) Due to this being a telephonic visit, the after visit summary with patients personalized plan was offered to patient via MyChart   Notes: Nothing significant to report at this time.

## 2024-09-15 ENCOUNTER — Encounter: Admitting: Family Medicine

## 2024-09-24 ENCOUNTER — Ambulatory Visit: Admitting: Family Medicine

## 2024-09-24 ENCOUNTER — Encounter: Payer: Self-pay | Admitting: Family Medicine

## 2024-09-24 VITALS — BP 140/70 | HR 46 | Temp 98.1°F | Resp 18 | Ht 66.0 in | Wt 120.4 lb

## 2024-09-24 DIAGNOSIS — I10 Essential (primary) hypertension: Secondary | ICD-10-CM

## 2024-09-24 DIAGNOSIS — Z0000 Encounter for general adult medical examination without abnormal findings: Secondary | ICD-10-CM | POA: Diagnosis not present

## 2024-09-24 DIAGNOSIS — Z23 Encounter for immunization: Secondary | ICD-10-CM | POA: Diagnosis not present

## 2024-09-24 DIAGNOSIS — J301 Allergic rhinitis due to pollen: Secondary | ICD-10-CM

## 2024-09-24 DIAGNOSIS — E559 Vitamin D deficiency, unspecified: Secondary | ICD-10-CM | POA: Diagnosis not present

## 2024-09-24 LAB — LIPID PANEL
Cholesterol: 180 mg/dL (ref 0–200)
HDL: 82.3 mg/dL (ref >39.00)
LDL Cholesterol: 73 mg/dL (ref 0–99)
NonHDL: 97.35
Total CHOL/HDL Ratio: 2
Triglycerides: 124 mg/dL (ref 0.0–149.0)
VLDL: 24.8 mg/dL (ref 0.0–40.0)

## 2024-09-24 LAB — COMPREHENSIVE METABOLIC PANEL WITH GFR
ALT: 15 U/L (ref 0–35)
AST: 19 U/L (ref 0–37)
Albumin: 4.5 g/dL (ref 3.5–5.2)
Alkaline Phosphatase: 37 U/L — ABNORMAL LOW (ref 39–117)
BUN: 18 mg/dL (ref 6–23)
CO2: 28 meq/L (ref 19–32)
Calcium: 9.8 mg/dL (ref 8.4–10.5)
Chloride: 102 meq/L (ref 96–112)
Creatinine, Ser: 0.55 mg/dL (ref 0.40–1.20)
GFR: 93.34 mL/min (ref >60.00)
Glucose, Bld: 92 mg/dL (ref 70–99)
Potassium: 4.7 meq/L (ref 3.5–5.1)
Sodium: 139 meq/L (ref 135–145)
Total Bilirubin: 0.6 mg/dL (ref 0.2–1.2)
Total Protein: 7.3 g/dL (ref 6.0–8.3)

## 2024-09-24 LAB — CBC WITH DIFFERENTIAL/PLATELET
Basophils Absolute: 0 K/uL (ref 0.0–0.1)
Basophils Relative: 0.8 % (ref 0.0–3.0)
Eosinophils Absolute: 0.1 K/uL (ref 0.0–0.7)
Eosinophils Relative: 2.2 % (ref 0.0–5.0)
HCT: 40 % (ref 36.0–46.0)
Hemoglobin: 13.6 g/dL (ref 12.0–15.0)
Lymphocytes Relative: 44.1 % (ref 12.0–46.0)
Lymphs Abs: 1.9 K/uL (ref 0.7–4.0)
MCHC: 34.1 g/dL (ref 30.0–36.0)
MCV: 92.3 fl (ref 78.0–100.0)
Monocytes Absolute: 0.4 K/uL (ref 0.1–1.0)
Monocytes Relative: 10.1 % (ref 3.0–12.0)
Neutro Abs: 1.8 K/uL (ref 1.4–7.7)
Neutrophils Relative %: 42.8 % — ABNORMAL LOW (ref 43.0–77.0)
Platelets: 260 K/uL (ref 150.0–400.0)
RBC: 4.33 Mil/uL (ref 3.87–5.11)
RDW: 12.9 % (ref 11.5–15.5)
WBC: 4.2 K/uL (ref 4.0–10.5)

## 2024-09-24 LAB — VITAMIN D 25 HYDROXY (VIT D DEFICIENCY, FRACTURES): VITD: 57.28 ng/mL (ref 30.00–100.00)

## 2024-09-24 LAB — TSH: TSH: 2.68 u[IU]/mL (ref 0.35–5.50)

## 2024-09-24 MED ORDER — POTASSIUM CHLORIDE CRYS ER 10 MEQ PO TBCR
10.0000 meq | EXTENDED_RELEASE_TABLET | Freq: Every day | ORAL | 3 refills | Status: AC
Start: 2024-09-24 — End: ?

## 2024-09-24 MED ORDER — FLUTICASONE PROPIONATE 50 MCG/ACT NA SUSP: 2.0000 | Freq: Every day | NASAL | 6 refills | Status: DC

## 2024-09-24 NOTE — Progress Notes (Signed)
 Subjective:    Patient ID: Sherri Hoffman, female    DOB: [birth year], 68 y.o.   MRN: [id number]  Chief Complaint  Patient presents with   Annual Exam    Pt states fasting     HPI Patient is in today for cpe.  Discussed the use of AI scribe software for clinical note transcription with the patient, who gave verbal consent to proceed.  History of Present Illness Sherri Hoffman is a 68 year old female who presents for an annual physical exam and flu shot.  She requires a refill for her potassium medication, which was last prescribed in June for six months. Her Flonase  may also need a refill as she did not receive it last month. She is not using albuterol  currently, as it was only needed during a severe sinus issue a couple of years ago.  She experiences arthritis, particularly in her knees, causing intermittent pain and swelling. The symptoms vary, with some days being more painful than others. She has a torn meniscus in one knee.  Her family history has changed recently with her oldest brother being diagnosed with leukemia, although he is currently considered cancer-free after treatment. She has no new skin cancer and has had a recent scan confirming this.  She regularly sees an eye doctor, dentist, and another physician, Dr. Darcel. Her mammogram and DEXA scan results are good. No current issues with her stomach and her joints are doing okay, although she experiences arthritis-related knee pain intermittently.    Past Medical History:  Diagnosis Date   Hypertension    Skin cancer, basal cell     Past Surgical History:  Procedure Laterality Date   KNEE ARTHROSCOPY Left 03/06/2022   [place]   MYOMECTOMY      Family History  Problem Relation Age of Onset   Heart disease Mother    Diabetes Mother    Hyperlipidemia Mother    COPD Mother    Cancer Father        melanoma   Heart disease Father        chf   COPD Father    Cancer Brother        leukemia   Depression  Brother    Skin cancer Other        Melanoma   Coronary artery disease Other    COPD Other    Cancer Other        skin, melanoma    Social History   Socioeconomic History   Marital status: Married    Spouse name: Not on file   Number of children: Not on file   Years of education: Not on file   Highest education level: Not on file  Occupational History   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  Tobacco Use   Smoking status: Never   Smokeless tobacco: Never  Substance and Sexual Activity   Alcohol use: Yes    Alcohol/week: 3.0 standard drinks of alcohol    Types: 3 Glasses of wine per week   Drug use: No   Sexual activity: Yes    Partners: Male    Birth control/protection: Post-menopausal  Other Topics Concern   Not on file  Social History Narrative   Not on file   Social Drivers of Health   Financial Resource Strain: Low Risk  (11/21/2022)   Overall Financial Resource Strain (CARDIA)    Difficulty of Paying Living Expenses: Not hard at all  Food Insecurity: No Food Insecurity (  09/09/2024)   Hunger Vital Sign    Worried About Running Out of Food in the Last Year: Never true    Ran Out of Food in the Last Year: Never true  Transportation Needs: No Transportation Needs (09/09/2024)   PRAPARE - Administrator, Civil Service (Medical): No    Lack of Transportation (Non-Medical): No  Physical Activity: Sufficiently Active (09/09/2024)   Exercise Vital Sign    Days of Exercise per Week: 5 days    Minutes of Exercise per Session: 90 min  Stress: No Stress Concern Present (11/21/2022)   [employer] of Occupational Health - Occupational Stress Questionnaire    Feeling of Stress : Not at all  Social Connections: Socially Integrated (09/09/2024)   Social Connection and Isolation Panel    Frequency of Communication with Friends and Family: More than three times a week    Frequency of Social Gatherings with Friends and Family: More than three times a  week    Attends Religious Services: More than 4 times per year    Active Member of [employer] or Organizations: Yes    Attends Engineer, Structural: More than 4 times per year    Marital Status: Married  Catering Manager Violence: Not At Risk (09/09/2024)   Humiliation, Afraid, Rape, and Kick questionnaire    Fear of Current or Ex-Partner: No    Emotionally Abused: No    Physically Abused: No    Sexually Abused: No    Outpatient Medications Prior to Visit  Medication Sig Dispense Refill   albuterol  (VENTOLIN  HFA) 108 (90 Base) MCG/ACT inhaler Inhale 2 puffs into the lungs every 6 (six) hours as needed for wheezing or shortness of breath. 20.1 g 2   azelastine  (ASTELIN ) 0.1 % nasal spray Place 2 sprays into both nostrils 2 (two) times daily. Use in each nostril as directed 30 mL 12   Calcium  Carbonate-Vitamin D  500-125 MG-UNIT TABS Take by mouth.     cholecalciferol (VITAMIN D ) 1000 units tablet Take 2,000 Units by mouth daily.      estradiol  (ESTRACE ) 1 MG tablet Take 1 tablet (1 mg total) by mouth daily. 90 tablet 3   Flaxseed, Linseed, (FLAX SEED OIL PO) Take 1 capsule by mouth in the morning and at bedtime.     levocetirizine (XYZAL ) 5 MG tablet TAKE 1 TABLET(5 MG) BY MOUTH DAILY AS NEEDED FOR ALLERGIES 90 tablet 1   progesterone  (PROMETRIUM ) 100 MG capsule Take 1 capsule (100 mg total) by mouth daily. 90 capsule 4   valsartan -hydrochlorothiazide  (DIOVAN -HCT) 320-25 MG tablet TAKE 1 TABLET BY MOUTH DAILY 90 tablet 1   fluticasone  (FLONASE ) 50 MCG/ACT nasal spray Place 2 sprays into both nostrils daily. 16 g 6   potassium chloride  (KLOR-CON  M) 10 MEQ tablet Take 1 tablet (10 mEq total) by mouth daily. 90 tablet 1   No facility-administered medications prior to visit.    Allergies  Allergen Reactions   Ace Inhibitors     REACTION: COUGH   Erythromycin     REACTION: NAUSEA   Hydrocodone  Bit-Homatrop Mbr     Nausea/vomitting   Tussionex Pennkinetic Er [Hydrocod Poli-Chlorphe  Poli Er]     nausea    Review of Systems  Constitutional:  Negative for chills, fever and malaise/fatigue.  HENT:  Negative for congestion and hearing loss.   Eyes:  Negative for blurred vision and discharge.  Respiratory:  Negative for cough, sputum production and shortness of breath.   Cardiovascular:  Negative for  chest pain, palpitations and leg swelling.  Gastrointestinal:  Negative for abdominal pain, blood in stool, constipation, diarrhea, heartburn, nausea and vomiting.  Genitourinary:  Negative for dysuria, frequency, hematuria and urgency.  Musculoskeletal:  Negative for back pain, falls and myalgias.  Skin:  Negative for rash.  Neurological:  Negative for dizziness, sensory change, loss of consciousness, weakness and headaches.  Endo/Heme/Allergies:  Negative for environmental allergies. Does not bruise/bleed easily.  Psychiatric/Behavioral:  Negative for depression and suicidal ideas. The patient is not nervous/anxious and does not have insomnia.        Objective:    Physical Exam Vitals and nursing note reviewed.  Constitutional:      General: She is not in acute distress.    Appearance: Normal appearance. She is well-developed.  HENT:     Head: Normocephalic and atraumatic.     Right Ear: Tympanic membrane, ear canal and external ear normal. There is no impacted cerumen.     Left Ear: Tympanic membrane, ear canal and external ear normal. There is no impacted cerumen.     Nose: Nose normal.     Mouth/Throat:     Mouth: Mucous membranes are moist.     Pharynx: Oropharynx is clear. No oropharyngeal exudate or posterior oropharyngeal erythema.  Eyes:     General: No scleral icterus.       Right eye: No discharge.        Left eye: No discharge.     Conjunctiva/sclera: Conjunctivae normal.     Pupils: Pupils are equal, round, and reactive to light.  Neck:     Thyroid : No thyromegaly or thyroid  tenderness.     Vascular: No JVD.  Cardiovascular:     Rate and Rhythm:  Normal rate and regular rhythm.     Heart sounds: Normal heart sounds. No murmur heard. Pulmonary:     Effort: Pulmonary effort is normal. No respiratory distress.     Breath sounds: Normal breath sounds.  Abdominal:     General: Bowel sounds are normal. There is no distension.     Palpations: Abdomen is soft. There is no mass.     Tenderness: There is no abdominal tenderness. There is no guarding or rebound.  Musculoskeletal:        General: Normal range of motion.     Cervical back: Normal range of motion and neck supple.     Right lower leg: No edema.     Left lower leg: No edema.  Lymphadenopathy:     Cervical: No cervical adenopathy.  Skin:    General: Skin is warm and dry.     Findings: No erythema or rash.  Neurological:     Mental Status: She is alert and oriented to person, place, and time.     Cranial Nerves: No cranial nerve deficit.     Deep Tendon Reflexes: Reflexes are normal and symmetric.  Psychiatric:        Mood and Affect: Mood normal.        Behavior: Behavior normal.        Thought Content: Thought content normal.        Judgment: Judgment normal.     BP (!) 140/70 (BP Location: Left Arm, Patient Position: Sitting, Cuff Size: Normal)   Pulse (!) 46   Temp 98.1 F (36.7 C) (Oral)   Resp 18   Ht 5' 6 (1.676 m)   Wt 120 lb 6.4 oz (54.6 kg)   SpO2 96%   BMI 19.43 kg/m  Wt Readings from Last 3 Encounters:  09/24/24 120 lb 6.4 oz (54.6 kg)  09/09/24 122 lb (55.3 kg)  08/06/24 122 lb 12.8 oz (55.7 kg)    Diabetic Foot Exam - Simple   No data filed    Lab Results  Component Value Date   WBC 4.6 09/12/2023   HGB 13.5 09/12/2023   HCT 41.2 09/12/2023   PLT 307.0 09/12/2023   GLUCOSE 96 09/12/2023   CHOL 203 (H) 09/12/2023   TRIG 145.0 09/12/2023   HDL 87.90 09/12/2023   LDLCALC 86 09/12/2023   ALT 16 09/12/2023   AST 18 09/12/2023   NA 139 09/12/2023   K 4.3 09/12/2023   CL 101 09/12/2023   CREATININE 0.55 09/12/2023   BUN 18 09/12/2023    CO2 29 09/12/2023   TSH 2.22 09/12/2023   HGBA1C 5.5 01/11/2014    Lab Results  Component Value Date   TSH 2.22 09/12/2023   Lab Results  Component Value Date   WBC 4.6 09/12/2023   HGB 13.5 09/12/2023   HCT 41.2 09/12/2023   MCV 94.5 09/12/2023   PLT 307.0 09/12/2023   Lab Results  Component Value Date   NA 139 09/12/2023   K 4.3 09/12/2023   CO2 29 09/12/2023   GLUCOSE 96 09/12/2023   BUN 18 09/12/2023   CREATININE 0.55 09/12/2023   BILITOT 0.6 09/12/2023   ALKPHOS 41 09/12/2023   AST 18 09/12/2023   ALT 16 09/12/2023   PROT 7.3 09/12/2023   ALBUMIN 4.5 09/12/2023   CALCIUM  9.7 09/12/2023   GFR 94.03 09/12/2023   Lab Results  Component Value Date   CHOL 203 (H) 09/12/2023   Lab Results  Component Value Date   HDL 87.90 09/12/2023   Lab Results  Component Value Date   LDLCALC 86 09/12/2023   Lab Results  Component Value Date   TRIG 145.0 09/12/2023   Lab Results  Component Value Date   CHOLHDL 2 09/12/2023   Lab Results  Component Value Date   HGBA1C 5.5 01/11/2014       Assessment & Plan:  Preventative health care Assessment & Plan: Ghm utd Check labs  See AVS  Health Maintenance  Topic Date Due   Zoster Vaccines- Shingrix (1 of 2) 01/13/1974   DTaP/Tdap/Td (2 - Tdap) 03/17/2017   COVID-19 Vaccine (3 - Moderna risk series) 09/09/2025 (Originally 02/11/2020)   Medicare Annual Wellness (AWV)  09/09/2025   Mammogram  04/22/2026   DEXA SCAN  04/22/2026   Colonoscopy  08/11/2028   Pneumococcal Vaccine: 50+ Years  Completed   Influenza Vaccine  Completed   Hepatitis C Screening  Completed   Meningococcal B Vaccine  Aged Out      Need for influenza vaccination -     Flu vaccine HIGH DOSE PF(Fluzone Trivalent)  Seasonal allergic rhinitis due to pollen -     Fluticasone  Propionate; Place 2 sprays into both nostrils daily.  Dispense: 16 g; Refill: 6  Essential hypertension Assessment & Plan: Well controlled, no changes to meds.  Encouraged heart healthy diet such as the DASH diet and exercise as tolerated.    Orders: -     Potassium Chloride  Crys ER; Take 1 tablet (10 mEq total) by mouth daily.  Dispense: 90 tablet; Refill: 3 -     CBC with Differential/Platelet -     Comprehensive metabolic panel with GFR -     Lipid panel -     TSH  Vitamin D  deficiency -  VITAMIN D  25 Hydroxy (Vit-D Deficiency, Fractures)  Assessment and Plan Assessment & Plan Adult Wellness Visit   Routine adult wellness visit with ongoing management of health conditions. Her brother's recent leukemia diagnosis and treatment were noted. She maintains regular follow-ups with her eye doctor, dentist, and other specialists. Recent skin cancer scans and mammogram are normal, as is the DEXA scan. Administered flu shot and encouraged regular follow-ups with specialists.  Immunization management   Discussed immunizations including flu shot, shingles vaccine, and tetanus booster. She has not received the shingles vaccine and is overdue for the tetanus booster. Administered flu shot and advised to get the tetanus booster at a convenient time. Discussed shingles vaccine series.  Essential hypertension   Hypertension is well-managed with valsartan . Recent prescription for six months was sent in June. Continue valsartan  as prescribed.  Allergic rhinitis due to pollen   Allergic rhinitis is managed with Flonase  and Astelin . She reports running out of Flonase  and needs a refill. Astelin  supply is adequate. Sent refill for Flonase .  Bilateral knee osteoarthritis   Intermittent pain and stiffness, exacerbated by cold weather. She has a torn meniscus and ACL in one knee, with plans for surgery after the holidays. Advised to avoid high-impact activities and focus on strengthening exercises. Encouraged physical therapy and strengthening exercises. Plan for knee surgery after the holidays.    Shepherd Finnan R Lowne Chase, DO

## 2024-09-24 NOTE — Assessment & Plan Note (Signed)
 Ghm utd Check labs  See AVS  Health Maintenance  Topic Date Due   Zoster Vaccines- Shingrix (1 of 2) 01/13/1974   DTaP/Tdap/Td (2 - Tdap) 03/17/2017   COVID-19 Vaccine (3 - Moderna risk series) 09/09/2025 (Originally 02/11/2020)   Medicare Annual Wellness (AWV)  09/09/2025   Mammogram  04/22/2026   DEXA SCAN  04/22/2026   Colonoscopy  08/11/2028   Pneumococcal Vaccine: 50+ Years  Completed   Influenza Vaccine  Completed   Hepatitis C Screening  Completed   Meningococcal B Vaccine  Aged Out

## 2024-09-24 NOTE — Assessment & Plan Note (Signed)
 Well controlled, no changes to meds. Encouraged heart healthy diet such as the DASH diet and exercise as tolerated.

## 2024-10-01 ENCOUNTER — Ambulatory Visit: Payer: Self-pay | Admitting: Family Medicine

## 2024-10-22 DIAGNOSIS — H524 Presbyopia: Secondary | ICD-10-CM | POA: Diagnosis not present

## 2024-10-26 ENCOUNTER — Other Ambulatory Visit: Payer: Self-pay

## 2024-10-26 DIAGNOSIS — J301 Allergic rhinitis due to pollen: Secondary | ICD-10-CM

## 2024-10-26 MED ORDER — FLUTICASONE PROPIONATE 50 MCG/ACT NA SUSP: 2.0000 | Freq: Every day | NASAL | 1 refills | Status: AC

## 2024-12-18 NOTE — Progress Notes (Signed)
 JALISHA ENNEKING                                          MRN: [id number]   12/18/2024   The [facility] Quality Team Specialist reviewed this patient medical record for the purposes of chart review for care gap closure. The following were reviewed: chart review for care gap closure-controlling blood pressure.    [facility] Quality Team

## 2025-09-16 ENCOUNTER — Ambulatory Visit

## 2025-09-30 ENCOUNTER — Encounter: Admitting: Family Medicine
